# Patient Record
Sex: Male | Born: 1980
Health system: Southern US, Community
[De-identification: ages and names within clinical notes are randomized; demographics above are authoritative.]

## PROBLEM LIST (undated history)

## (undated) DIAGNOSIS — K219 Gastro-esophageal reflux disease without esophagitis: Secondary | ICD-10-CM

## (undated) DIAGNOSIS — I10 Essential (primary) hypertension: Secondary | ICD-10-CM

## (undated) DIAGNOSIS — J302 Other seasonal allergic rhinitis: Secondary | ICD-10-CM

## (undated) DIAGNOSIS — F1120 Opioid dependence, uncomplicated: Secondary | ICD-10-CM

## (undated) DIAGNOSIS — M1990 Unspecified osteoarthritis, unspecified site: Secondary | ICD-10-CM

## (undated) DIAGNOSIS — K5090 Crohn's disease, unspecified, without complications: Secondary | ICD-10-CM

## (undated) DIAGNOSIS — K5010 Crohn's disease of large intestine without complications: Secondary | ICD-10-CM

## (undated) DIAGNOSIS — Z87442 Personal history of urinary calculi: Secondary | ICD-10-CM

## (undated) DIAGNOSIS — K859 Acute pancreatitis without necrosis or infection, unspecified: Secondary | ICD-10-CM

## (undated) HISTORY — PX: TONSILLECTOMY: SUR1361

## (undated) HISTORY — PX: COLECTOMY: SHX59

## (undated) HISTORY — PX: LAPAROSCOPIC ABDOMINAL EXPLORATION: SHX6249

## (undated) HISTORY — PX: HERNIA REPAIR: SHX51

## (undated) HISTORY — PX: INCISIONAL HERNIA REPAIR: SHX193

## (undated) HISTORY — PX: COLONOSCOPY: SHX174

## (undated) HISTORY — DX: Opioid dependence, uncomplicated: F11.20

---

## 1998-01-05 ENCOUNTER — Inpatient Hospital Stay (HOSPITAL_COMMUNITY): Admission: EM | Admit: 1998-01-05 | Discharge: 1998-01-08 | Payer: Self-pay | Admitting: Gastroenterology

## 1998-01-05 ENCOUNTER — Ambulatory Visit (HOSPITAL_COMMUNITY): Admission: RE | Admit: 1998-01-05 | Discharge: 1998-01-05 | Payer: Self-pay | Admitting: Family Medicine

## 1998-06-23 HISTORY — PX: APPENDECTOMY: SHX54

## 1998-08-22 ENCOUNTER — Encounter: Payer: Self-pay | Admitting: Emergency Medicine

## 1998-08-22 ENCOUNTER — Emergency Department (HOSPITAL_COMMUNITY): Admission: EM | Admit: 1998-08-22 | Discharge: 1998-08-22 | Payer: Self-pay | Admitting: Emergency Medicine

## 1998-10-10 ENCOUNTER — Ambulatory Visit (HOSPITAL_COMMUNITY): Admission: RE | Admit: 1998-10-10 | Discharge: 1998-10-10 | Payer: Self-pay | Admitting: Gastroenterology

## 1998-11-17 ENCOUNTER — Encounter: Payer: Self-pay | Admitting: Emergency Medicine

## 1998-11-17 ENCOUNTER — Emergency Department (HOSPITAL_COMMUNITY): Admission: EM | Admit: 1998-11-17 | Discharge: 1998-11-17 | Payer: Self-pay | Admitting: Emergency Medicine

## 1998-12-15 ENCOUNTER — Encounter: Payer: Self-pay | Admitting: Gastroenterology

## 1998-12-15 ENCOUNTER — Inpatient Hospital Stay (HOSPITAL_COMMUNITY): Admission: EM | Admit: 1998-12-15 | Discharge: 1998-12-27 | Payer: Self-pay | Admitting: Emergency Medicine

## 1998-12-16 ENCOUNTER — Encounter: Payer: Self-pay | Admitting: Gastroenterology

## 1998-12-21 ENCOUNTER — Encounter (INDEPENDENT_AMBULATORY_CARE_PROVIDER_SITE_OTHER): Payer: Self-pay | Admitting: Specialist

## 1999-01-16 ENCOUNTER — Encounter: Payer: Self-pay | Admitting: General Surgery

## 1999-01-16 ENCOUNTER — Inpatient Hospital Stay (HOSPITAL_COMMUNITY): Admission: EM | Admit: 1999-01-16 | Discharge: 1999-01-18 | Payer: Self-pay | Admitting: *Deleted

## 1999-01-16 ENCOUNTER — Encounter: Payer: Self-pay | Admitting: Surgery

## 1999-01-20 ENCOUNTER — Emergency Department (HOSPITAL_COMMUNITY): Admission: EM | Admit: 1999-01-20 | Discharge: 1999-01-21 | Payer: Self-pay | Admitting: Internal Medicine

## 1999-01-21 ENCOUNTER — Encounter: Payer: Self-pay | Admitting: Internal Medicine

## 1999-01-24 ENCOUNTER — Encounter: Payer: Self-pay | Admitting: Gastroenterology

## 1999-01-24 ENCOUNTER — Ambulatory Visit (HOSPITAL_COMMUNITY): Admission: RE | Admit: 1999-01-24 | Discharge: 1999-01-24 | Payer: Self-pay | Admitting: Gastroenterology

## 1999-01-29 ENCOUNTER — Encounter: Payer: Self-pay | Admitting: General Surgery

## 1999-01-29 ENCOUNTER — Encounter: Payer: Self-pay | Admitting: Emergency Medicine

## 1999-01-29 ENCOUNTER — Inpatient Hospital Stay (HOSPITAL_COMMUNITY): Admission: EM | Admit: 1999-01-29 | Discharge: 1999-02-02 | Payer: Self-pay | Admitting: Emergency Medicine

## 1999-07-14 ENCOUNTER — Encounter: Payer: Self-pay | Admitting: Emergency Medicine

## 1999-07-14 ENCOUNTER — Emergency Department (HOSPITAL_COMMUNITY): Admission: EM | Admit: 1999-07-14 | Discharge: 1999-07-14 | Payer: Self-pay | Admitting: Emergency Medicine

## 1999-10-14 ENCOUNTER — Encounter: Payer: Self-pay | Admitting: Gastroenterology

## 1999-10-14 ENCOUNTER — Ambulatory Visit (HOSPITAL_COMMUNITY): Admission: RE | Admit: 1999-10-14 | Discharge: 1999-10-14 | Payer: Self-pay | Admitting: Gastroenterology

## 1999-10-27 ENCOUNTER — Inpatient Hospital Stay (HOSPITAL_COMMUNITY): Admission: EM | Admit: 1999-10-27 | Discharge: 1999-10-31 | Payer: Self-pay

## 1999-10-27 ENCOUNTER — Encounter: Payer: Self-pay | Admitting: Gastroenterology

## 1999-10-29 ENCOUNTER — Encounter: Payer: Self-pay | Admitting: Gastroenterology

## 2000-01-27 ENCOUNTER — Inpatient Hospital Stay (HOSPITAL_COMMUNITY): Admission: RE | Admit: 2000-01-27 | Discharge: 2000-01-28 | Payer: Self-pay | Admitting: General Surgery

## 2000-07-01 ENCOUNTER — Encounter: Payer: Self-pay | Admitting: Gastroenterology

## 2000-07-02 ENCOUNTER — Inpatient Hospital Stay (HOSPITAL_COMMUNITY): Admission: EM | Admit: 2000-07-02 | Discharge: 2000-07-03 | Payer: Self-pay | Admitting: Emergency Medicine

## 2000-07-06 ENCOUNTER — Encounter: Payer: Self-pay | Admitting: Gastroenterology

## 2000-07-07 ENCOUNTER — Encounter: Payer: Self-pay | Admitting: Gastroenterology

## 2000-07-08 ENCOUNTER — Inpatient Hospital Stay (HOSPITAL_COMMUNITY): Admission: EM | Admit: 2000-07-08 | Discharge: 2000-07-09 | Payer: Self-pay | Admitting: Gastroenterology

## 2000-07-09 ENCOUNTER — Encounter: Payer: Self-pay | Admitting: Gastroenterology

## 2000-08-14 ENCOUNTER — Encounter: Payer: Self-pay | Admitting: Gastroenterology

## 2000-08-14 ENCOUNTER — Inpatient Hospital Stay (HOSPITAL_COMMUNITY): Admission: EM | Admit: 2000-08-14 | Discharge: 2000-08-21 | Payer: Self-pay | Admitting: Emergency Medicine

## 2000-08-18 ENCOUNTER — Encounter: Payer: Self-pay | Admitting: Gastroenterology

## 2000-08-20 ENCOUNTER — Encounter: Payer: Self-pay | Admitting: Gastroenterology

## 2000-08-21 ENCOUNTER — Encounter: Payer: Self-pay | Admitting: Gastroenterology

## 2000-09-09 ENCOUNTER — Emergency Department (HOSPITAL_COMMUNITY): Admission: EM | Admit: 2000-09-09 | Discharge: 2000-09-09 | Payer: Self-pay | Admitting: Emergency Medicine

## 2000-09-09 ENCOUNTER — Encounter: Payer: Self-pay | Admitting: Emergency Medicine

## 2000-09-15 ENCOUNTER — Emergency Department (HOSPITAL_COMMUNITY): Admission: EM | Admit: 2000-09-15 | Discharge: 2000-09-16 | Payer: Self-pay | Admitting: Emergency Medicine

## 2000-09-16 ENCOUNTER — Encounter: Payer: Self-pay | Admitting: Emergency Medicine

## 2000-09-17 ENCOUNTER — Observation Stay (HOSPITAL_COMMUNITY): Admission: EM | Admit: 2000-09-17 | Discharge: 2000-09-18 | Payer: Self-pay | Admitting: Emergency Medicine

## 2000-09-17 ENCOUNTER — Encounter: Payer: Self-pay | Admitting: Family Medicine

## 2000-09-23 ENCOUNTER — Emergency Department (HOSPITAL_COMMUNITY): Admission: EM | Admit: 2000-09-23 | Discharge: 2000-09-23 | Payer: Self-pay | Admitting: Emergency Medicine

## 2000-09-23 ENCOUNTER — Encounter: Payer: Self-pay | Admitting: Emergency Medicine

## 2000-10-16 ENCOUNTER — Emergency Department (HOSPITAL_COMMUNITY): Admission: EM | Admit: 2000-10-16 | Discharge: 2000-10-16 | Payer: Self-pay | Admitting: Emergency Medicine

## 2000-10-16 ENCOUNTER — Encounter: Payer: Self-pay | Admitting: Emergency Medicine

## 2000-10-18 ENCOUNTER — Emergency Department (HOSPITAL_COMMUNITY): Admission: EM | Admit: 2000-10-18 | Discharge: 2000-10-18 | Payer: Self-pay | Admitting: Emergency Medicine

## 2000-10-20 ENCOUNTER — Emergency Department (HOSPITAL_COMMUNITY): Admission: EM | Admit: 2000-10-20 | Discharge: 2000-10-20 | Payer: Self-pay | Admitting: Emergency Medicine

## 2000-10-23 ENCOUNTER — Encounter: Payer: Self-pay | Admitting: Emergency Medicine

## 2000-10-23 ENCOUNTER — Emergency Department (HOSPITAL_COMMUNITY): Admission: EM | Admit: 2000-10-23 | Discharge: 2000-10-23 | Payer: Self-pay | Admitting: Emergency Medicine

## 2000-11-17 ENCOUNTER — Emergency Department (HOSPITAL_COMMUNITY): Admission: EM | Admit: 2000-11-17 | Discharge: 2000-11-18 | Payer: Self-pay | Admitting: Emergency Medicine

## 2000-12-14 ENCOUNTER — Emergency Department (HOSPITAL_COMMUNITY): Admission: EM | Admit: 2000-12-14 | Discharge: 2000-12-14 | Payer: Self-pay | Admitting: *Deleted

## 2000-12-14 ENCOUNTER — Encounter: Payer: Self-pay | Admitting: Emergency Medicine

## 2000-12-28 ENCOUNTER — Encounter: Payer: Self-pay | Admitting: Internal Medicine

## 2000-12-28 ENCOUNTER — Inpatient Hospital Stay (HOSPITAL_COMMUNITY): Admission: RE | Admit: 2000-12-28 | Discharge: 2000-12-31 | Payer: Self-pay | Admitting: Gastroenterology

## 2000-12-29 ENCOUNTER — Encounter: Payer: Self-pay | Admitting: Internal Medicine

## 2000-12-30 ENCOUNTER — Encounter: Payer: Self-pay | Admitting: Gastroenterology

## 2001-01-08 ENCOUNTER — Encounter: Payer: Self-pay | Admitting: Emergency Medicine

## 2001-01-08 ENCOUNTER — Emergency Department (HOSPITAL_COMMUNITY): Admission: EM | Admit: 2001-01-08 | Discharge: 2001-01-08 | Payer: Self-pay | Admitting: Emergency Medicine

## 2001-01-10 ENCOUNTER — Encounter: Payer: Self-pay | Admitting: Emergency Medicine

## 2001-01-10 ENCOUNTER — Emergency Department (HOSPITAL_COMMUNITY): Admission: EM | Admit: 2001-01-10 | Discharge: 2001-01-10 | Payer: Self-pay | Admitting: *Deleted

## 2001-01-13 ENCOUNTER — Emergency Department (HOSPITAL_COMMUNITY): Admission: EM | Admit: 2001-01-13 | Discharge: 2001-01-14 | Payer: Self-pay | Admitting: Internal Medicine

## 2001-01-18 ENCOUNTER — Emergency Department (HOSPITAL_COMMUNITY): Admission: EM | Admit: 2001-01-18 | Discharge: 2001-01-18 | Payer: Self-pay | Admitting: Emergency Medicine

## 2001-01-26 ENCOUNTER — Encounter: Admission: RE | Admit: 2001-01-26 | Discharge: 2001-02-20 | Payer: Self-pay | Admitting: Anesthesiology

## 2001-06-29 ENCOUNTER — Encounter: Payer: Self-pay | Admitting: Emergency Medicine

## 2001-06-29 ENCOUNTER — Emergency Department (HOSPITAL_COMMUNITY): Admission: EM | Admit: 2001-06-29 | Discharge: 2001-06-29 | Payer: Self-pay | Admitting: Emergency Medicine

## 2001-08-05 ENCOUNTER — Encounter: Payer: Self-pay | Admitting: Emergency Medicine

## 2001-08-05 ENCOUNTER — Emergency Department (HOSPITAL_COMMUNITY): Admission: EM | Admit: 2001-08-05 | Discharge: 2001-08-05 | Payer: Self-pay | Admitting: Emergency Medicine

## 2001-08-07 ENCOUNTER — Emergency Department (HOSPITAL_COMMUNITY): Admission: EM | Admit: 2001-08-07 | Discharge: 2001-08-07 | Payer: Self-pay | Admitting: Emergency Medicine

## 2001-08-07 ENCOUNTER — Encounter: Payer: Self-pay | Admitting: Emergency Medicine

## 2001-09-03 ENCOUNTER — Encounter: Payer: Self-pay | Admitting: Emergency Medicine

## 2001-09-03 ENCOUNTER — Emergency Department (HOSPITAL_COMMUNITY): Admission: EM | Admit: 2001-09-03 | Discharge: 2001-09-03 | Payer: Self-pay | Admitting: Emergency Medicine

## 2001-09-19 ENCOUNTER — Encounter: Payer: Self-pay | Admitting: Emergency Medicine

## 2001-09-19 ENCOUNTER — Emergency Department (HOSPITAL_COMMUNITY): Admission: EM | Admit: 2001-09-19 | Discharge: 2001-09-19 | Payer: Self-pay | Admitting: Emergency Medicine

## 2001-09-25 ENCOUNTER — Emergency Department (HOSPITAL_COMMUNITY): Admission: EM | Admit: 2001-09-25 | Discharge: 2001-09-25 | Payer: Self-pay | Admitting: Emergency Medicine

## 2001-09-25 ENCOUNTER — Encounter: Payer: Self-pay | Admitting: Emergency Medicine

## 2001-10-09 ENCOUNTER — Emergency Department (HOSPITAL_COMMUNITY): Admission: EM | Admit: 2001-10-09 | Discharge: 2001-10-09 | Payer: Self-pay

## 2001-10-15 ENCOUNTER — Emergency Department (HOSPITAL_COMMUNITY): Admission: EM | Admit: 2001-10-15 | Discharge: 2001-10-15 | Payer: Self-pay | Admitting: Emergency Medicine

## 2001-11-05 ENCOUNTER — Ambulatory Visit (HOSPITAL_COMMUNITY): Admission: RE | Admit: 2001-11-05 | Discharge: 2001-11-05 | Payer: Self-pay | Admitting: Gastroenterology

## 2001-11-08 ENCOUNTER — Emergency Department (HOSPITAL_COMMUNITY): Admission: EM | Admit: 2001-11-08 | Discharge: 2001-11-08 | Payer: Self-pay | Admitting: Emergency Medicine

## 2001-11-08 ENCOUNTER — Encounter: Payer: Self-pay | Admitting: Emergency Medicine

## 2001-11-09 ENCOUNTER — Emergency Department (HOSPITAL_COMMUNITY): Admission: EM | Admit: 2001-11-09 | Discharge: 2001-11-10 | Payer: Self-pay | Admitting: *Deleted

## 2001-11-10 ENCOUNTER — Encounter: Payer: Self-pay | Admitting: Emergency Medicine

## 2001-11-18 ENCOUNTER — Emergency Department (HOSPITAL_COMMUNITY): Admission: EM | Admit: 2001-11-18 | Discharge: 2001-11-19 | Payer: Self-pay | Admitting: Emergency Medicine

## 2001-12-08 ENCOUNTER — Emergency Department (HOSPITAL_COMMUNITY): Admission: EM | Admit: 2001-12-08 | Discharge: 2001-12-08 | Payer: Self-pay

## 2001-12-09 ENCOUNTER — Emergency Department (HOSPITAL_COMMUNITY): Admission: EM | Admit: 2001-12-09 | Discharge: 2001-12-09 | Payer: Self-pay | Admitting: Emergency Medicine

## 2002-01-12 ENCOUNTER — Emergency Department (HOSPITAL_COMMUNITY): Admission: EM | Admit: 2002-01-12 | Discharge: 2002-01-12 | Payer: Self-pay | Admitting: Emergency Medicine

## 2002-01-14 ENCOUNTER — Emergency Department (HOSPITAL_COMMUNITY): Admission: EM | Admit: 2002-01-14 | Discharge: 2002-01-14 | Payer: Self-pay | Admitting: Emergency Medicine

## 2002-01-15 ENCOUNTER — Emergency Department (HOSPITAL_COMMUNITY): Admission: EM | Admit: 2002-01-15 | Discharge: 2002-01-15 | Payer: Self-pay | Admitting: Emergency Medicine

## 2002-01-15 ENCOUNTER — Encounter: Payer: Self-pay | Admitting: Emergency Medicine

## 2002-01-17 ENCOUNTER — Emergency Department (HOSPITAL_COMMUNITY): Admission: EM | Admit: 2002-01-17 | Discharge: 2002-01-17 | Payer: Self-pay | Admitting: Emergency Medicine

## 2002-03-17 ENCOUNTER — Emergency Department (HOSPITAL_COMMUNITY): Admission: EM | Admit: 2002-03-17 | Discharge: 2002-03-17 | Payer: Self-pay | Admitting: Emergency Medicine

## 2002-08-29 ENCOUNTER — Emergency Department (HOSPITAL_COMMUNITY): Admission: EM | Admit: 2002-08-29 | Discharge: 2002-08-29 | Payer: Self-pay | Admitting: Emergency Medicine

## 2002-08-29 ENCOUNTER — Encounter: Payer: Self-pay | Admitting: Emergency Medicine

## 2003-05-31 ENCOUNTER — Emergency Department (HOSPITAL_COMMUNITY): Admission: AD | Admit: 2003-05-31 | Discharge: 2003-05-31 | Payer: Self-pay | Admitting: Family Medicine

## 2003-06-18 ENCOUNTER — Emergency Department (HOSPITAL_COMMUNITY): Admission: EM | Admit: 2003-06-18 | Discharge: 2003-06-18 | Payer: Self-pay | Admitting: Emergency Medicine

## 2003-06-24 HISTORY — PX: LAPAROSCOPIC CHOLECYSTECTOMY: SUR755

## 2004-03-10 ENCOUNTER — Emergency Department (HOSPITAL_COMMUNITY): Admission: EM | Admit: 2004-03-10 | Discharge: 2004-03-10 | Payer: Self-pay | Admitting: Emergency Medicine

## 2005-12-03 ENCOUNTER — Emergency Department (HOSPITAL_COMMUNITY): Admission: EM | Admit: 2005-12-03 | Discharge: 2005-12-03 | Payer: Self-pay | Admitting: Emergency Medicine

## 2006-02-05 ENCOUNTER — Emergency Department (HOSPITAL_COMMUNITY): Admission: EM | Admit: 2006-02-05 | Discharge: 2006-02-05 | Payer: Self-pay | Admitting: Emergency Medicine

## 2006-03-26 ENCOUNTER — Emergency Department (HOSPITAL_COMMUNITY): Admission: EM | Admit: 2006-03-26 | Discharge: 2006-03-26 | Payer: Self-pay | Admitting: Emergency Medicine

## 2006-04-14 ENCOUNTER — Emergency Department (HOSPITAL_COMMUNITY): Admission: EM | Admit: 2006-04-14 | Discharge: 2006-04-15 | Payer: Self-pay | Admitting: Emergency Medicine

## 2006-04-21 ENCOUNTER — Ambulatory Visit (HOSPITAL_COMMUNITY): Admission: RE | Admit: 2006-04-21 | Discharge: 2006-04-21 | Payer: Self-pay | Admitting: Family Medicine

## 2006-07-13 ENCOUNTER — Emergency Department (HOSPITAL_COMMUNITY): Admission: EM | Admit: 2006-07-13 | Discharge: 2006-07-13 | Payer: Self-pay | Admitting: Emergency Medicine

## 2006-07-15 ENCOUNTER — Emergency Department (HOSPITAL_COMMUNITY): Admission: EM | Admit: 2006-07-15 | Discharge: 2006-07-15 | Payer: Self-pay | Admitting: Emergency Medicine

## 2006-07-26 ENCOUNTER — Emergency Department: Payer: Self-pay | Admitting: Emergency Medicine

## 2006-08-04 ENCOUNTER — Emergency Department: Payer: Self-pay | Admitting: Unknown Physician Specialty

## 2006-09-01 ENCOUNTER — Encounter (INDEPENDENT_AMBULATORY_CARE_PROVIDER_SITE_OTHER): Payer: Self-pay | Admitting: Specialist

## 2006-09-01 ENCOUNTER — Ambulatory Visit (HOSPITAL_COMMUNITY): Admission: RE | Admit: 2006-09-01 | Discharge: 2006-09-01 | Payer: Self-pay | Admitting: Surgery

## 2006-09-12 ENCOUNTER — Emergency Department (HOSPITAL_COMMUNITY): Admission: EM | Admit: 2006-09-12 | Discharge: 2006-09-12 | Payer: Self-pay | Admitting: Emergency Medicine

## 2007-10-01 ENCOUNTER — Emergency Department (HOSPITAL_COMMUNITY): Admission: EM | Admit: 2007-10-01 | Discharge: 2007-10-01 | Payer: Self-pay | Admitting: Emergency Medicine

## 2007-10-12 ENCOUNTER — Emergency Department (HOSPITAL_COMMUNITY): Admission: EM | Admit: 2007-10-12 | Discharge: 2007-10-12 | Payer: Self-pay | Admitting: Emergency Medicine

## 2007-12-18 ENCOUNTER — Emergency Department (HOSPITAL_BASED_OUTPATIENT_CLINIC_OR_DEPARTMENT_OTHER): Admission: EM | Admit: 2007-12-18 | Discharge: 2007-12-18 | Payer: Self-pay | Admitting: Emergency Medicine

## 2007-12-31 ENCOUNTER — Emergency Department (HOSPITAL_BASED_OUTPATIENT_CLINIC_OR_DEPARTMENT_OTHER): Admission: EM | Admit: 2007-12-31 | Discharge: 2007-12-31 | Payer: Self-pay | Admitting: Emergency Medicine

## 2008-01-25 ENCOUNTER — Emergency Department (HOSPITAL_COMMUNITY): Admission: EM | Admit: 2008-01-25 | Discharge: 2008-01-25 | Payer: Self-pay | Admitting: Emergency Medicine

## 2008-01-26 ENCOUNTER — Emergency Department (HOSPITAL_COMMUNITY): Admission: EM | Admit: 2008-01-26 | Discharge: 2008-01-26 | Payer: Self-pay | Admitting: Emergency Medicine

## 2009-06-23 DIAGNOSIS — F1120 Opioid dependence, uncomplicated: Secondary | ICD-10-CM

## 2009-06-23 HISTORY — DX: Opioid dependence, uncomplicated: F11.20

## 2010-08-31 ENCOUNTER — Emergency Department (HOSPITAL_BASED_OUTPATIENT_CLINIC_OR_DEPARTMENT_OTHER)
Admission: EM | Admit: 2010-08-31 | Discharge: 2010-08-31 | Disposition: A | Discharge status: Home / Self Care | Payer: Self-pay | Attending: Emergency Medicine | Admitting: Emergency Medicine

## 2010-08-31 DIAGNOSIS — R220 Localized swelling, mass and lump, head: Secondary | ICD-10-CM | POA: Insufficient documentation

## 2010-08-31 DIAGNOSIS — K029 Dental caries, unspecified: Secondary | ICD-10-CM | POA: Insufficient documentation

## 2010-08-31 DIAGNOSIS — K089 Disorder of teeth and supporting structures, unspecified: Secondary | ICD-10-CM | POA: Insufficient documentation

## 2010-08-31 DIAGNOSIS — K5090 Crohn's disease, unspecified, without complications: Secondary | ICD-10-CM | POA: Insufficient documentation

## 2010-08-31 DIAGNOSIS — K219 Gastro-esophageal reflux disease without esophagitis: Secondary | ICD-10-CM | POA: Insufficient documentation

## 2010-08-31 DIAGNOSIS — I10 Essential (primary) hypertension: Secondary | ICD-10-CM | POA: Insufficient documentation

## 2010-11-08 NOTE — H&P (Signed)
[facility]  Patient:    Leroy Herrera, Leroy Herrera                     MRN: [id number] Adm. Date:  [id number] Attending:  Nicholaus Bloom CC:         Sandy Salaam. Deatra Ina, M.D. [facility]  Stephens Shire, M.D.  Dr. Oletta Lamas   History and Physical  NEW PATIENT EVALUATION:  Leroy Herrera is a 30 year old who is sent to [place] by Sandy Salaam. Deatra Ina, M.D. for chronic abdominal pain. The patient complains of right upper quadrant, constant, deep, dull, toothache-type discomfort in the right upper quadrant with no lancinating quality. He related the following history. At the age of 31, he was hospitalized with pancreatitis. At which time, he had a sharp, central, epigastric discomfort. He was treated with bowel rest and IV fluids and did well. He went onto college at that point and developed intermittent discomfort in his abdomen and was evaluated by Jeneen Rinks L. Edwards, M.D. and found on colonoscopy to have Crohns disease. He was treated with prednisone and Pentasa. He took this for about a year and was doing well up until June of 2000 when he developed a perforation of his colon and underwent a partial colectomy by Benjamin T. Hoxworth, M.D. He did well for a few weeks and redeveloped his discomfort. He later underwent a exploratory laparotomy in August of 2001 with lysis of adhesions. Following this, he was left with his chronic right upper quadrant, dull discomfort. He could not identify any exacerbating or improving features, except for the fact that if he works all day long towards the end of the day he has discomfort. He has noted that pain medications will take the edge off. Vicodin is much more effective than Darvocet. He has been tried on Tylenol No. 3 but describes splotches of his face and swelling of his neck and morphine which causes splotching and what sounds like hives. He denies numbness or tingling, bowel or bladder incontinence, or weakness. He does not know if his  abdominal complaints are affected by his bowel functions or his eating.  He has had a bout of nephrolithiasis and can tell the difference between these discomforts.  Other medical interventions have included Demerol and Tylenol No. 3.  CURRENT MEDICATIONS:  Pentasa, Paxil, and Darvocet.  ALLERGIES:  TYLENOL NO. 3 and MORPHINE.  FAMILY HISTORY:  Positive for diabetes, cancer, hypertension, and end-stage renal disease.  PAST SURGICAL HISTORY:  Significant for ileocolonic resection in 2000, exploratory laparotomy with lysis of adhesions in August of 2001.  SOCIAL HISTORY:  The patient is a pack-per-day smoker. He does not drink alcohol. 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  ACTIVE MEDICAL PROBLEMS:  Crohns disease, history of renal calculi, and a remote history of pancreatitis, with remote history of gastroesophageal reflux disease.  REVIEW OF SYSTEMS:  GENERAL:  Negative.  HEAD:  Negative. EYES:  Negative. NOSE, MOUTH, THROAT:  Negative. EARS: Negative. PULMONARY:  Negative. CARDIOVASCULAR:  Negative. GI/GU:  See HPI. MUSCULOSKELETAL:  Negative. NEUROLOGICAL:  Negative. HEMATOLOGIC:  Negative. CUTANEOUS:  Negative. ENDOCRINE:  Negative. PSYCHIATRIC:  Positive for depression and panic attacks. ALLERGY/IMMUNOLOGIC:  Negative.  PHYSICAL EXAMINATION:  VITAL SIGNS:  Blood pressure is 144/84, heart rate is 88, respiratory rate is 18, O2 saturation is 98%, pain level is 7/10.  HEENT:  Head was normocephalic, atraumatic. Eyes:  Extraocular movements intact with conjunctivae and sclerae clear. Nose:  Patent nares without discharge. Oropharynx:  Free of  lesions.  NECK:  Supple without lymphadenopathy. Carotids were 2+ and symmetric without bruits.  LUNGS:  Clear.  HEART:  Regular rate and rhythm.  ABDOMEN:  Well-healed abdominal surgical scars with bowel sounds present. He had intact superficial abdominal reflexes, intact sensation over his abdomen. I cannot  appreciate any organomegaly.  GENITALIA:  Not performed.  RECTAL:  Not performed.  BACK:  No tenderness to percussion over the vertebrae with negative straight leg raise signs.  EXTREMITIES:  No clubbing, cyanosis, or edema. Radial pulses and dorsalis pedis pulses were 2+ and symmetric.  NEUROLOGICAL:  The patient was oriented to person, place, time, and reason for visit. Cranial nerves 2 through 12 are grossly intact. Deep tendon reflexes were symmetric in the upper and lower extremities with downgoing toes. Motor was 5/5 with symmetric bulk and tone. Sensory was intact to vibratory sense, pinprick, and light touch. Coordination was grossly intact.  IMPRESSION: 1. Chronic abdominal pain with history of pancreatitis in the past and Crohns    disease. 2. History of renal calculi. 3. Depression/panic disorder. 4. History of gastroesophageal reflux disease.  DISPOSITION: 1. I advised the patient that I felt that Darvocet was probably not a good    drug for a chronic abdominal pain and recommended that we go to a    longer-acting opiate rather than short-acting opiate which would probably    give him more smooth and even control of his discomfort. We discussed the    options of methadone versus Duragesic patches versus OxyContin. He wishes    to try the OxyContin initially. He has tried Percocet in the past and did    not breakout with a rash. I will go ahead and start him on OxyContin 10 mg    one p.o. b.i.d., #60 with no refill. He is aware that he needs to stop the    Darvocet while he is on this. Side effects of this were reviewed with him    in detail. 2. He has gone ahead and signed the controlled substance agreement with [place]. 3. I will see him back in follow-up in four weeks, but he is to call me in    seven days to let me know how he is doing. In the meantime, I have     encouraged him to go ahead and get ______ forms for his primary care    physician to fill out so that  he does not get into problems with his    employer with regard to his Crohns disease. He plans to follow up with me    at the [address] address. DD:  01/27/01 TD:  01/27/01 Job: [id number]

## 2010-11-08 NOTE — Op Note (Signed)
[facility]  Patient:    Leroy Herrera, Leroy Herrera                     MRN: [id number] Proc. Date: 01/27/00 Adm. Date:  [id number] Attending:  Excell Seltzer [place]                           Operative Report  PREOPERATIVE DIAGNOSIS:  History of Crohns disease with recurring abdominal pain.  POSTOPERATIVE DIAGNOSIS:  History of Crohns disease with recurring abdominal pain.  OPERATION PERFORMED:  Laparoscopy and lysis of adhesions.  SURGEON:  Darene Lamer. Hoxworth, M.D.  ANESTHESIA:  General.  BRIEF HISTORY:  Leroy Herrera is a 30 year old white male one year status post resection of terminal ileum for severe Crohns disease.  Since that time he has had recurring episodic abdominal pain suggestive of intermittent obstruction, but has had a very thorough negative workup on several occasions. His pain continues to recur and is a significant problem for him.  We have discussed options including observation versus laparoscopy and lysis of adhesions.  After a long discussion with the patient and his family, we have elected to proceed with laparoscopy.  The nature of the procedure, indications and risks of bleeding, infection, bowel injury and possible need for open procedure were discussed and understood preoperatively.  He is now brought to the operating room for this procedure.  DESCRIPTION OF PROCEDURE:  The patient was brought to the operating room and placed in the supine position on the operating table and general endotracheal anesthesia was induced.  He had had a mechanical and antibiotic bowel prep. Antibiotics were given preoperatively.  A Foley catheter was placed.  The abdomen was sterilely prepped and draped.  An incision 1 cm was made in the left flank and dissection carried down through the fascia and muscle layer, and the peritoneum entered.  A Hasson trocar was placed through a mattress suture of 0 Vicryl.  A pneumoperitoneum was established.   There were adhesions of the omentum up to the anterior midline incision.  Two 5 mm trocars were placed in the left and right mid abdomen under direct vision.  The omental adhesions were then all taken down from the anterior abdominal wall with sharp and blunt dissection, and use of the harmonic scalpel.  The anterior abdominal wall was completely clear.  There was no evidence of incisional hernia.  The omentum was then retracted superiorly and the small bowel carefully examined. Beginning at about the mid ileum, the small bowel was traced distally.  About 6 inches from the anastomosis there was a single rather dense band like adhesion from the terminal ileum down to the mesentery with a potential closed loop several cm in diameter.  This band was divided with the harmonic scalpel. The terminal ileum was then traced distally and the anastomosis identified. The terminal ileum was completely freed up to the area of the anastomosis. The bowel itself appeared normal with no evidence of Crohns disease or other active inflammation.  Following this, the bowel was then traced distally to proximally from the anastomosis to the ileocecal valve and there were no further adhesions or abnormalities of any kind.  The liver and gallbladder appeared normal.  The abdomen was again inspected for hemostasis and any evidence of bowel injury and everything looked fine.  The viscera were returned to their anatomical position.  The trocars were removed under direct vision and the pursestring suture secured  in the left flank.  Skin incisions were closed with interrupted subcuticular 4-0 Monocryl and Steri-Strips. Sponge and needle counts correct.  Dry sterile dressing was applied and the patient taken to recovery room in good condition. DD:  01/27/00 TD:  01/28/00 Job: [id number]

## 2010-11-08 NOTE — Discharge Summary (Signed)
[facility]  Patient:    Leroy Herrera, Leroy Herrera                     MRN: [id number] Adm. Date:  [id number] Disc. Date: [id number] Attending:  Eustaquio Maize CC:         Princess Bruins. Gaynell Face, M.D.  Thomas C. Verl Blalock, M.D. [facility]  James L. Oletta Lamas, M.D.   Discharge Summary  ADMISSION DIAGNOSES: 1. Syncope. 2. Crohns. 3. Nephrolithiasis. 4. Depression.  DISCHARGE DIAGNOSES: 1. Syncope secondary to dehydration/micturition. 2. Gastroesophageal reflux disease. 3. Depression. 4. Crohns. 5. Nephrolithiasis.  HISTORY OF PRESENT ILLNESS:  This is a 30 year old white male with long-standing history of Crohns, and recently found to have nephrolithiasis, had felt a little bit dizzy when he had passed kidney stones.  There was some pain.  Also has low back pain, a little worse since he fell.  He has felt dizzy and light-headed every time he stands, also sometimes when he is sitting. He tells me never when he is laying down.  Yesterday and today he passed out with no warning.  This was shortly after urination.  It was felt he was unconscious for approximately 3 minutes.  Mom says the second time he gasped for air, and was complaining of chest discomfort.  This was described as a tightness, but was not associated with nausea, vomiting, diaphoresis, shortness of breath, radiation, etc.  There was no seizure activity noted.  He has had no recent fevers.  He is admitted at this time for further evaluation and cardiology/neurology consults.  HOSPITAL COURSE:  The patient was admitted, was placed on telemetry. Laboratory data was obtained, and an EEG was done which was negative.  Head CT was also negative.  Chest x-ray was normal.  All labs checked were unremarkable, except for urine showed large blood.  Drug screen was positive for benzodiazepines which he has taken in the past.  Troponin was 0.03.  CK-MB was 0.9.  White count 7100, hemoglobin 14, hematocrit 41.1.   Normal differential.  TSH 0.936.  CK-MB at 0900 on 09/17/00 was 103 with an MB of 1.2. Electrolytes and LFTs were all normal.  His telemetry showed no significant abnormality.  A 2-D echocardiogram was also obtained, and unfortunately the results are pending at this time.  He was up and ambulating in the room with no difficulty.  He did have significant orthostatic blood pressure changes. Subsequently it was felt IV fluids and chronic hydration was indicated.  Dr. Verl Blalock from a cardiology standpoint saw the patient and felt this was most likely post-micturitional syncope.  Dr. Gaynell Face also saw the patient from a neurology standpoint, and agreed that the syncope was likely post-micturitional.  He also felt that this was unrelated to [place] basilar migraines, unlikely seizures with a normal EEG.  The patient also has migraines which occur about once a week, but he does not miss work from these. Typically, he will take Tylenol or Advil, lay down, and it will resolve.  He also has a strong family history of diabetes, and had a mild peripheral polyneuropathy in a stalking distribution (Dr. Gaynell Face felt a hemoglobin A1C may be indicated).  At the time of his visit it was felt that he might have a right otitis externa.  When I looked at him today his TM and EACs looked okay.  LABORATORY DATA:  Only as above.  DISPOSITION:  The patient is discharged to home on Paxil 20 mg p.o. q.d., Nexium 40  mg p.o. q.d., Pentasa four pills p.o. q.i.d., ______ 0.125 mg p.o. q.i.d. p.r.n.  These are all old medications.  His new medication will be Naprosyn 500 mg p.o. b.i.d. with food for back pain.  ACTIVITIES:  As tolerated.  He is instructed to drink at least eight 8 ounce glasses of water per day.  He is to call me if he has any questions, and he should see me in the office within the next month if he is having any further problems. DD:  09/18/00 TD:  09/18/00 Job: [id number]

## 2010-11-08 NOTE — Discharge Summary (Signed)
[facility]  Patient:    Leroy Herrera, Leroy Herrera                     MRN: [id number] Adm. Date:  [id number] Disc. Date: [id number] Attending:  Rafael Bihari CC:         Stephens Shire, M.D.  Darene Lamer. Hoxworth, M.D.  Shanda Bumps., M.D.   Discharge Summary  REASON FOR ADMISSION:  Nausea, vomiting, abdominal pain, chills, possible bowel obstruction due to Crohns disease.  DISCHARGE DIAGNOSES: 1. Renal stone. 2. Abdominal pain, nausea and vomiting resolved. 3. History of Crohns disease.  HISTORY OF PRESENT ILLNESS:  This is a 30 year old, white male with a history of Crohns disease at terminal ileum.  He has had previous resection with several bouts of severe abdominal pain, nausea and vomiting requiring hospitalization.  These have all resolved without any specific therapies. Previous CT scans, urinalysis and small bowel series have all failed to show any cause for his pain.  He came in with another episode with similar symptoms.  PHYSICAL EXAMINATION:  VITAL SIGNS:  Temperature 98.5, pulse 108, blood pressure 138/99.  GENERAL:  Well-developed, well-nourished, white male in no acute distress. Had received Demerol.  HEENT:  Normal.  LUNGS:  Normal.  CARDIOVASCULAR:  Normal.  ABDOMEN:  Soft, nondistended with hypoactive bowel sounds, mild right-sided tenderness.  For more details, please see dictated admission History and Physical.  HOSPITAL COURSE:  The patient was admitted to the medical floor.  His admission labs revealed a normal white count and normal liver function test. He had a small bowel series that showed no evidence of active Crohns disease. He also had abdominal films that showed no evidence of obstruction.  There were several air fluid levels that could have been early obstruction or a possible ileus.  The patient continued to complain of pain.  Some of this appeared to be heartburn.  He would eat, then throw  up, then stop eating due to pain.  He felt he needed narcotics.  At times, he appeared to be pain free. He was seen in consultation by Dr. Excell Seltzer who did not feel he had a surgical abdomen.  His pain did improve and his diet was advanced.  He was treated empirically with Librax and Protonix.  He had a return of pain and labs were repeated including a urinalysis which showed hematuria.  Attempts were made to perform a CT scan of the abdomen by renal protocol to rule out an obstructed ureter from renal stones.  Due to the barium the patient ingested from his small bowel series, this was unable to be done for two days until he had an aggressive laxative therapy.  He still continued to have pain.  The night before admission, the patient had two small stones that were passed and recovered.  It was anticipated that he would see the urologist and we had the patient keep the stones to show to the urologist when he went.  He was pain free.  A CT had been obtained that showed no obstructed ureter and no evidence of recurrent stones.  We did make a follow-up appointment with Dr. Serita Butcher and he is due to follow up with Dr. Oletta Lamas in three to four weeks.  DISPOSITION:  Discharged to home.  CONDITION ON DISCHARGE:  Much improved.  He is still having some abdominal pain, but much better.  DIET:  Regular.  DISCHARGE MEDICATIONS: 1. Vicodin ES one q.4h. p.r.n.  2. Pentaza four tablets q.i.d. 3. Ativan 1 mg b.i.d. 4. Paxil 20 mg q.d. 5. Bentyl 20 mg t.i.d. 6. Prilosec p.r.n. DD:  09/08/00 TD:  09/09/00 Job: [id number]

## 2010-11-08 NOTE — Discharge Summary (Signed)
[facility]  Patient:    Leroy Herrera, Leroy Herrera                     MRN: [id number] Adm. Date:  [id number] Disc. Date: [id number] Attending:  Sherrin Daisy CC:         Stephens Shire, M.D.             Darene Lamer. Hoxworth, M.D.                           Discharge Summary  REASON FOR ADMISSION:  Lavada Mesi with Crohns disease with abdominal pain.  FINAL DIAGNOSES: 1. Nausea and vomiting and abdominal pain, resolved.  Etiology unclear. 2. History of Crohns disease, status post resection. 3. Nausea and vomiting. 4. Drug induced delirium.  PROCEDURES: 1. IVP. 2. CT of the abdomen and pelvis. 3. Abdominal ultrasound. 4. Upper GI. 5. Small-bowel follow-through.  HISTORY OF PRESENT ILLNESS:  This is a 30 year old with Crohns disease, failed medical therapy, ultimately ended up in 2000 with a resection of the terminal ileum and cecum.  Did well for a year, then developed recurrent abdominal pain, nausea and vomiting, has had several hospitalizations. Ultimately, ended up in August 2001 with a laparoscopy and lysis of adhesions by Dr. Excell Seltzer.  Did reasonably well since then.  Ended up being hospitalized last week.  He was discharged home.  Despite taking his medications he became dizzy, woke up with severe abdominal pain, nauseated, and vomited everything. He presented to the emergency room after these symptoms.  PHYSICAL EXAMINATION:  VITAL SIGNS:  Stable.  He was afebrile.  HEENT:  Normal.  HEART:  Normal.  LUNGS:  Normal.  ABDOMEN:  Soft, nondistended, with no obvious tenderness.  For more details, please see dictated admission history and physical.  HOSPITAL COURSE:  The patient was admitted to a medical floor.  His labs were remarkable for a normal white count, sedimentation rate, and urinalysis, other then a small amount of leukocyte esterase.  Culture revealed an insignificant growth, and normal CMET.  We did do an upper GI and small  bowel series which showed no evidence of obstruction.  No evidence of active Crohns disease. Abdominal ultrasound was normal.  CT of the abdomen and pelvis were normal. This was done with the renal stone protocol due to the abnormalities of his urine, and this was normal.  He was seen in consultation by Dr. Excell Seltzer who felt that he had the same pain as last year, and did not have a need for acute surgery.  After several days of IV pain medications, the patient improved. His diet was advanced.  He became confused, and it turned out that he had been apparently taking some Xanax that he had at home.  We assured him that he should not be taking, in addition to the Phenergan.  It appears that he has taken Phenergan before and become confused.  IVP was negative.  We went ahead and felt that since he was tolerating a diet he could be discharged home.  DISPOSITION:  The patient is discharged home in improved condition.  DISCHARGE MEDICATIONS: 1. Paxil 20 mg q.d. 2. Levbid 1/2 to one tablet b.i.d. or t.i.d. 3. Xanax 0.25 mg one tablet t.i.d. p.r.n. 4. Vicodin p.r.n. pain. 5. He is instructed to avoid Phenergan.  FOLLOWUP:  He will see Dr. Oletta Lamas in the office in 2 to 3 weeks, and call if symptoms worsen. DD:  07/26/00 TD:  07/28/00 Job: [id number]

## 2010-11-08 NOTE — Op Note (Signed)
NAME:  Leroy Herrera, Leroy Herrera NO.:  [id number]   MEDICAL RECORD NO.:  [id number]          PATIENT TYPE:  AMB   LOCATION:  DAY                          FACILITY:  [facility]   PHYSICIAN:  Thomas A. Cornett, M.D.DATE OF BIRTH:  May 06, 1981   DATE OF PROCEDURE:  09/01/2006  DATE OF DISCHARGE:                               OPERATIVE REPORT   PREOPERATIVE DIAGNOSES:  Biliary colic with a low ejection fraction.   POSTOPERATIVE DIAGNOSES:  Biliary colic with a low ejection fraction.   PROCEDURE:  Laparoscopic cholecystectomy with cholangiogram.   SURGEON:  Thomas A. Cornett, M.D.   ANESTHESIA:  General endotracheal anesthesia with 0.25% Sensorcaine.   ESTIMATED BLOOD LOSS:  20 mL.   ASSISTANT:  Dr. Kathrin Penner.   DRAINS:  None.   SPECIMENS:  Gallbladder to pathology.   INDICATIONS FOR PROCEDURE:  The patient is a 30 year old male with  significant right upper quadrant pain. He had an extensive workup and  the only abnormality was a depressed ejection fraction on a nuclear  medicine study. After talking with him, I felt his symptoms were  consistent with colic and recommended a laparoscopic cholecystectomy for  relief of his pain. I explained this only works in about half the  patients in this setting but he was eager to proceed since he has tried  everything else short of this. Informed consent was obtained. The  potential complications were discussed with the patient and he agreed to  proceed.   DESCRIPTION OF PROCEDURE:  The patient was brought to the operating  suite and placed supine. After induction of general anesthesia, the  abdomen was prepped and draped in a sterile fashion. A 1 cm  supraumbilical incision was made, dissection was carried down to his  fascia. The fascia was opened with a scalpel blade. I used a small  hemostat to open the peritoneal lining and then placed my finger and  swept around and felt no intraabdominal adhesions. A pursestring suture  of #0 Vicryl was placed and a 12 mm Hasson cannula was placed under  direct vision. Pneumoperitoneum was created to 15 mmHg of CO2 and the  laparoscope was placed. There were some adhesions scattered throughout  the lower abdomen but the upper abdomen was clear. No signs of bowel  injury with insertion of the Hasson. An 11 mm subxiphoid port was then  placed with local anesthesia under direct vision. Two 5-mm ports were  placed in the right mid abdomen in a similar fashion. The dome of the  gallbladder was grasped and pushed toward the patient's right shoulder.  A second grasper was used to grab the infundibulum of the gallbladder  and pull it toward the patient's right lower quadrant. The peritoneum  was pulled away from the gallbladder to expose the cystic duct. I  dissected around it circumferentially. A clip was placed on the  gallbladder side of this and a small incision was made for  intraoperative cholangiogram. Through a separate stab incision, a Cook  cholangiogram catheter was introduced, placed in the cystic duct and  held in place via clip. One-half strength Hypaque  dye was used with  fluoroscopy. Unfortunately we only got a snap shot of the cholangiogram.  This showed the catheter entering the cystic duct with free flow of  contrast down the common duct into the duodenum. There was free flow up  the common hepatic duct to the bifurcation of right and left hepatic  ducts. There was no evidence of dilatation, stone or stricture. There  was free flow into the duodenum without extravasation. At this point in  time, I removed the catheter and triple clipped the cystic duct and  divided it. The cystic artery was divided in a similar fashion between  clips. There was a posterior branch that I controlled with the clip of  the cystic artery as well. Cautery was used to dissect the gallbladder  from the gallbladder bed without difficulty. The entire gallbladder was  removed from the  gallbladder bed. The bed was inspected and I saw no  leakage of bile or bleeding. The clips were on the appropriate  structures. The gallbladder was placed in an EndoCatch bag. I irrigated  out the gallbladder bed, it was hemostatic, and suctioned out the  irrigation. This was clear with no evidence of bleeding or bile. The  gallbladder was extracted through the umbilicus and passed off the  field. I closed the umbilical port with a pursestring suture that was  already in place. I inspected the gallbladder bed and found it to be  hemostatic. There is no excess fluid to suction out. No signs of solid  or hollow organ injury with inspection of the abdominal cavity at this  point. The 5-mm ports were removed. The camera was withdrawn, CO2 was  allowed to escape and the 11-mm subxiphoid port was removed. The skin  edges were closed with 4-0 Monocryl. Steri-Strips and dry dressings were  applied. All final counts of sponge, needle and instruments were found  to be correct at this portion of the case. The patient was awoke and  taken to recovery in satisfactory condition. The gallbladder was sent to  pathology for evaluation.      Thomas A. Cornett, M.D.  Electronically Signed     TAC/MEDQ  D:  09/01/2006  T:  09/01/2006  Job:  [id number]   cc:   Reginia Forts, M.D.  Fax: [phone]

## 2010-11-08 NOTE — Discharge Summary (Signed)
[place]  Patient:    Leroy Herrera, Leroy Herrera                     MRN: [id number] Adm. Date:  [id number] Disc. Date: [id number] Attending:   Cellar Dictator:   Vena Rua, P.A.-C. CC:         Stephens Shire, M.D.  [address], [place]  [id number]  Sandy Salaam. Deatra Ina, M.D. [facility]   Discharge Summary  ADMITTING DIAGNOSES: 1. Acute-on-chronic abdominal pain, nausea, vomiting, and diarrhea.  Rule out    small bowel obstruction, rule out IBS flare, rule out Crohns flare, rule    out renal stones. 2. Status post ileocolonic resection in 2000. 3. Status post exploratory laparotomy in August 2001 secondary to persistent    postoperative abdominal pain. 4. History of gastroesophageal reflux disease. 5. Questionable medication-seeking behavior. 6. Depression, anxiety with panic features. 7. History of nephrolithiasis, rule out active, symptomatic kidney stone. 8. History of idiopathic pancreatitis.  DISCHARGE DIAGNOSES: 1. Early partial small bowel obstruction versus ileus, resolved. 2. Acute abdominal pain, nausea, vomiting, and increase in diarrhea associated    with this, much improved. 3. Chronic, narcotics-requiring abdominal pain. 4. History of Crohns ileocolitis, status post ileocolectomy, no evidence for    active Crohns disease at present. 5. History of idiopathic pancreatitis, no biochemical evidence for recurrence.  HISTORY OF PRESENT ILLNESS:  Leroy Herrera is a 30 year old white male who has above-noted GI history significant for Crohns disease requiring ileocolonic resection and, later, because of chronic abdominal pain and exploratory laparotomy and lysis of adhesions.  Unfortunately, he has chronic abdominal pain. This had led to his seeking medical attention from Dr. Deatra Ina in June 2002.  Prior to that, he had been a patient of Dr. Oletta Lamas.  Patient and his family had  become disenchanted with Dr. Oletta Lamas because the patient continued to have persistent abdominal pain over which Dr. Oletta Lamas had no control.  in any event, he saw Dr. Deatra Ina in the office on May 18 and was complaining of persistent abdominal pain.  This pain was completely in line with pain which had led to multiple admissions in the year 2002 including January 9, January 14, February 22, and March 28, all brief admissions for pain management. Several CTs, small bowel follow-throughs, upper GI series, and a colonoscopy prior to 2002 had not revealed any evidence for activation of his Crohns disease.  He is maintained on Pentasa 1 g four times daily.  When he saw Dr. Deatra Ina in the office, Dr. Deatra Ina had none of his prior GI records.  He did add Entocort 3 mg three times a day at that time.  For pain management, the patient has been using about two to three Darvocets daily.  He also is treated for depression with panic features with Paxil.  He uses Imodium as needed.  Essentially, he stools about two to three times a day, unformed, soft stools.  In the last week or so prior to this admission, patient was having worsening of his right-sided abdominal pain, mostly in the lower quadrants, associated with increased stooling of a more watery stool but no blood.  He was also throwing up but was not having any hematemesis or coffee-ground emesis.  The patient had been in contact with Dr. Guillermina [place] office a couple of times during the week of July 5.  Initially, he was given a prescription  for more Darvocet and Friday, July 6, he was given a limited prescription for Tylenol No. 3. However, the Tylenol No. 3. did cause him to have a rash and some swelling of the lymph nodes in his neck, so he stopped it.  Patient was quite miserable over the weekend and, when he called the office on Monday, July 8, Dr. Deatra Ina arranged for him to be directly admitted, concerned that his Crohns disease may be reactivated  or that he may have a small bowel obstruction.  LABORATORY DATA:  A urinalysis was negative, amylase was 52, lipase 23.  A CMET was entirely within normal limits with BUN of 15, creatinine of 1.0, potassium 4.0, glucose 90, total protein 6.8, and albumin 4.0, both within normal limits.  Total bilirubin 0.7, alkaline phosphatase 98, AST 20, ALT 22. White blood cell count 7.3, hemoglobin 15.2, hematocrit 43.2, platelets 236, MCV 84.5.  Differential on the CBC within normal limits.  Erythrocyte sedimentation rate was 1.  Imaging studies:  Initial acute abdominal series showed findings consistent with partial mechanical small bowel obstruction.  A follow-up two-view abdomen showed slight interval improvement.  Findings are nonspecific but could reflect a resolving low-grade partial small bowel obstruction versus ileus.  HOSPITAL COURSE:  Patient was admitted to unit 3000 and placed on a diet of clear liquids and IV fluids.  Once the x-ray findings showed the partial obstruction, he was made n.p.o.  Patient was comfortable with p.r.n. Darvocet and p.r.n. Phenergan.  He had some nausea but no further emesis.  Within 24 hours, his stools had slowed up and were of more substance.  By hospital day #3, stools were just about back to normal which is again two to three soft, unformed stools per day.  Pain by day #3 was not yet completely managed with Darvocet.  The x-ray on hospital day #3, showed complete resolution of bowel obstruction.  Regarding patients chronic pain and management of the pain, Dr. Henrene Pastor had a long discussion with the patient and his family.  Essentially, the patient is headed in the direction of chronic narcotics for pain management which may  exacerbate his symptoms because of its paralytic effect on the bowel and his tendency to obstruction versus ileus.  From the labs, there was no evidence that his pain is coming from activation of his Crohns.  This, again, confirms what  has been found on several admissions earlier this year to Dr. Oletta Lamas, that the patient is not having Crohns activation as the etiology for his pain, nor is he having recurrent pancreatitis or kidney stones.  Plan at present is to obtain an appointment with [facility] to see if there are some other strategies we can use for pain management.  By hospital day #4, the patients pain was quite tolerable though not resolved.  He was not having any nausea.  He was tolerating a low-residue diet and was ready for discharge.  Follow-up appointments are to be arranged with the [facility] as well as with Dr. Deatra Ina.  For refills of his Darvocet, patient was advised to contact is primary physician and, perhaps in the future, [facility] will be agreeable to dosing appropriate analgesics.  For now, a limited supply of Darvocet will be provided to the patient.  Because there is no evidence of activation of Crohns disease, his Entocort was discontinued during this admission.  DISCHARGE MEDICATIONS: 1. Pentasa 250 mg four p.o. q.i.d. 2. Darvocet-N 100 two to three  p.o. q.d. p.r.n. for pain.  A prescription for    45 of these was provided. 3. Paxil 30 mg p.o. q.d. 4. Imodium p.r.n.  ACTIVITY:  Patient is allowed to return to work on Monday, 7/15, with no lifting restrictions.  DIET:  Should be an ongoing, low-residue diet and avoidance of dairy products and excessive amounts of fatty foods. DD:  12/31/00 TD:  12/31/00 Job: [id number]

## 2010-11-08 NOTE — H&P (Signed)
[facility]  Patient:    TIRAS, BIANCHINI                     MRN: [id number] Adm. Date:  [id number] Attending:  Sherrin Daisy Dictator:   Evalee Jefferson, P.A. CC:         Tammy R. Modena Morrow, M.D.             Darene Lamer. Hoxworth, M.D.                         History and Physical  DATE OF BIRTH:  04-25-1981.  CHIEF COMPLAINT:  Abdominal pain.  HISTORY:  Ms. Cothern is a 30 year old white male with a history of Crohns ileocolitis who is approximately one year so ileocolectomy, who presents today o [facility] room with a two-day history of right lower quadrant and left-sided abdominal pain which has been intermittent in character, lasting approximately one to two hours, then completely subsiding for an hour or two. e describes the pain as very dull yet intense, rating it as an 8/10 on a 10-point  pain scale.  This pain has no radiation and he has had no recognized alleviators for these episodes of pain.  Of note, he does mention that his current symptoms are very reminiscent of the symptoms he experienced previous to his acute flare of Crohns, which occurred one year ago, and requiring an ileocolectomy.  Additionally, he reports that his bowel habits have also changed from approximately one to two bowel movements per day to four to six very loose stools and he also  experiences a mild sense of urgency without incontinence of stool, and has had o evidence of hematochezia, although he does report that two bowel movements on Friday were very dark in character, but since then, he has had brown stool with a very small amount of mucus mixed in.  He has been able to tolerate p.o. liquids and solids without nausea or emesis but does recognize that he has had a decrease in appetite over the past week or so.  He has had no fevers or chills.  He also does report dysphagia for which an upper GI series was obtained on October 14, 1999  which ruled out any anatomical causes, and he was recently placed on Valium by his primary care physician (5 mg t.i.d.), which he actually is using on a p.r.n. basis, usually one or two tablets per day, and has responded to this regimen.  He also reports very rare episodes of reflux-type symptoms for which he uses p.r.n. Protonix.  The last episode of reflux occurred approximately two weeks ago.  He is maintained at home on a daily course of Pentasa 250 mg, #4 tablets q.i.d., and has not missed any doses of this regimen.  PAST MEDICAL HISTORY: 1. Crohns ileocolitis. 2. History of prostatitis. 3. Dysphagia. 4. Distant history of pancreatitis.  PAST SURGICAL HISTORY: 1. T&A in 1988. 2. Ileocolectomy in June of 2000.  CURRENT MEDICATIONS: 1. Pentasa 250 mg tablets, four tablets q.i.d. 2. Protonix 40 mg p.r.n. 3. Valium 5 mg tablets p.r.n.  ALLERGIES:  MORPHINE.  FAMILY HISTORY:  Negative family history for gallstones, peptic ulcer disease, colon cancer and inflammatory bowel disease.  SOCIAL HISTORY:  He is a Ship broker at [employer], and of note, is scheduled to graduate  from his program this coming Friday.  He is also employed as a Architect at [employer].  He denies ETOH and cigarette use.  REVIEW OF SYSTEMS:  Negative for chest pain, shortness of breath, hematochezia,  rashes, joint pain, odynophagia, mouth sores, anorexia, nausea, vomiting and dizziness.  Positive for change in bowel habits.  Significant for increased frequency and looseness of stools.  Positive abdominal pain.  Positive fatigue he reports mild fatigue over the past several weeks, possibly stress- and work- and school-related), and plus-or-minus history of melena from two days ago.  PHYSICAL EXAMINATION:  GENERAL:  Patient is alert and oriented and in no acute distress.  VITAL SIGNS:  Temperature 98.6, pulse of 76, blood pressure 125/84 and respirations of 12.  HEENT:  He is anicteric and has  no conjunctival pallor.  Oropharynx is benign without lesions, exudates or erythema.  NECK:  There is no cervical or supraclavicular lymphadenopathy and no thyromegaly noted.  LUNGS:  Clear to auscultation bilaterally without rhonchi, wheezes or rales.  HEART:  Regular in rate and rhythm without murmurs, rubs, or gallops.  ABDOMEN:  Subjective tenderness over the right lower quadrant and left lower quadrant without guarding or rebound, normoactive bowel sounds, no succussion splash, no hepatosplenomegaly and normal tympany to percussion.  RECTAL:  Exam reveals an empty ampulla, normal prostate and heme-negative loose  brown stool.  EXTREMITIES:  No edema.  LABORATORY AND X-RAY FINDINGS:  CMET with the following values of sodium of 138, potassium of 4.5, chloride of 107 and a CO2 of 26, BUN of 16 and a creatinine of 1.1 with a glucose of 86.  Albumin is 4.5, AST 24, ALT 20, alkaline phosphatase  119, total bilirubin of 1.0, amylase 48, lipase 11.  A CBC was also unrevealing, with a WBC of 6.4 and hemoglobin of 16.2, hematocrit of 50.4 and a platelet count of 249,000.  His urinalysis was negative and his sed rate is currently still pending.  A three-way abdomen obtained on his arrival was also negative for any evidence f free air, no evidence of obstruction, but there was a moderate amount of stool throughout the colon.  ASSESSMENT:  This is a 30 year old white male with a two-day history of intensified right lower quadrant and left-sided abdominal pain with increased bowel movements and a generalized fatigue over the past several weeks, with a history of Crohns  ileocolitis, significant for an ileocolectomy performed one year ago.  PLAN:  Patient will be admitted to Dr. Jeneen Rinks L[facility] service for management f symptoms, which are presumably due to a flare in his Crohns ileocolitis. Depending on his response to supportive care, he may or may not  undergo colonoscopy or a small-bowel follow-through for further diagnostic purposes. DD:  10/27/99 TD:  10/28/99  Job: [id number]

## 2010-11-08 NOTE — Consult Note (Signed)
[facility]  Patient:    Leroy Herrera, Leroy Herrera                     MRN: [id number] Proc. Date: 09/17/00 Adm. Date:  [id number] Disc. Date: [id number] Attending:  Eustaquio Maize CC:         Tammy R. Modena Morrow, M.D.  Thomas C. Wall, M.D. [facility]   Consultation Report  DATE OF BIRTH:  1981/01/22.  CHIEF COMPLAINT:  Syncope.  HISTORY OF THE PRESENT CONDITION:  I was asked by Dr. Jae Dire. 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  He had two episodes while urinating of syncope without warning.  He lost consciousness for a period of up to three minutes. In the first episode, there was absolutely no warning; the second, the patient gasped for air and complained of chest pain.  Patient had severe headaches that followed the syncopal episode, in part it was because he fell and struck his head, in part the head pain extended way beyond the area where the head was hit.  Patient also had tightness or chest pain associated with nausea for about 15 minutes in duration, but these were not associated with the syncopal episodes.  Indeed, the pain that the patient had post event was not migrainous, as he understands his migraines.  Patient had no tonic-clonic activity with the episodes.  He did not lose control of his bowels and bladder.  He did not bite his tongue.  Past medical history:  The patient had episodes of shaking that were seizure like when he was less than a year of age.  His eyes rolled back.  Patient was evaluated and no further workup was carried out for reasons that are unclear to me.  His mother brought him in because he seemed to be somewhat disoriented, groggy, sluggish, hard to arouse and mentally sluggish after these episodes.  Past medical history is also remarkable for panic attacks.  PAST  SURGICAL HISTORY:  Ileal resection in Sep 16, 1997, then lysis of adhesions in 09-16-1998.  He has passed four kidney stones in the past month.  CURRENT MEDICATIONS: 1. Paxil 20 mg per day. 2. Nexium 40 mg per day. 3. Pentasa ______ one four times a day. 4. NuLev 0.125 mg four times a day as needed. 5. Tylenol and Vicodin as needed (he has been using Vicodin in the hospital).  ALLERGIES:  MORPHINE (rash), PHENERGAN caused hallucinations and is more of an intolerance.  Interestingly, he tolerates IV Phenergan rather than p.o., which I do not understand.  SOCIAL HISTORY:  The patient is an Cabin crew.  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  He smokes one-half pack of cigarettes per day.  He does not use alcohol or drugs.  Patient is a high Printmaker.  He is single.  FAMILY HISTORY:  Both parents and maternal grandmother and maternal uncle have hypertension.  Both parents have diabetes.  Maternal grandmother and maternal uncle have diabetes.  Father has nephrolithiasis.  Paternal grandmother died of stomach cancer.  Maternal grandfather has had renal cell cancer and cardiac arrhythmia.  Maternal grandmother died at age 28 of complications of diabetes and renal disease.  Maternal grandfather has chronic lymphocytic leukemia. There is history of myocardial infarctions in maternal uncles and paternal grandfather had diabetes and lung cancer.  REVIEW OF SYSTEMS:  Review of  systems is remarkable for a minimal cough, mild abdominal pain with nausea and diarrhea, low back pain and dysuria in the past, not since passing stones.  Review of systems is otherwise negative.  PHYSICAL EXAMINATION:  GENERAL:  On examination today, a pleasant, well-developed, right-handed young man in no distress, blond-haired, blue-eyed.  VITAL SIGNS:  Temperature 97.1.  Blood pressure 139/66; earlier in the day, the patient showed evidence of orthostatic blood pressures.  Resting pulse 68. Respirations 20.  Pulse  oximetry 99%.  HEENT:  Patient appears to have right otitis externa and an inflamed right umbo, suggesting otitis media on the right.  He had slight abrasions on his forehead.  NECK:  Supple.  No bruits.  LUNGS:  Clear.  HEART:  No murmurs.  Pulse is normal.  ABDOMEN:  Soft.  Bowel sounds normal.  EXTREMITIES:  Extremities are well-formed without edema, cyanosis, alterations in tone or tight heel cords.  NEUROLOGIC:  Mental status:  Patient was awake, alert, attentive, appropriate. No dysphasia or dyspraxia.  Cranial nerve examination:  Round, reactive pupils.  Normal fundi.  Full visual fields to double simultaneous stimuli. Extraocular movements full and conjugate, okay and responses equal bilaterally.  Symmetric facial strength and sensation.  Air conduction greater than bone conduction bilaterally.  Motor examination:  Normal strength, tone and mass.  Good fine motor movements.  No pronator drift.  Sensation intact to cold, vibration and stereoagnosis with the exception of a stocking neuropathy to the calf for cold and an ankle neuropathy for vibration.  Patient had good proprioception and stereoagnosis.  Two-point discrimination was 3 to 4 mm bilaterally. Cerebellar examination showed good finger-to-nose and rapid repetitive alternating movements.  Gait was normal.  He can walk on his heels and toes and perform a tandem without difficulty.  Deep tendon reflexes were normal to brisk, even at the ankles.  He had bilateral flexor plantar responses.  IMPRESSION: 1. Syncope, possible etiologies including micturition type, neurocardiogenic.    I doubt vertebrobasilar insufficiency, basilar migraine, seizures or a    colloid cyst of the III ventricle.  The latter two have some laboratory to    support that, including a normal electroencephalography and normal CT    brain. 2. Migraines without aura.  Patient has had this for years and is having them     about once per week  now. 3. Right otitis externa and probable right otitis media. 4. Orthostatic hypotension. 5. Mild peripheral polyneuropathy, stocking distribution. 6. Mild closed head injury without clear-cut sequelae.  SUGGESTION: 1. If the patients syncope persists, the patient would benefit from an event    recorder or a Holter monitor.  If neurocardiogenic syncope becomes more    suspected, then a tilt table test is the gold standard. 2. I would consider using ______ medications for the patients severe    headaches.  I would be reluctant to use beta blocker or calcium channel    blockers with the patients orthostatic hypotension. 3. Consider treating the infections in the auditory canal and middle ear, if    you agree. 4. If orthostatic symptoms persist and worsen, you could consider Mitodrine,    Florinef or just four 8-ounces glasses of Gatorade.  I would continue to    check orthostatic blood pressures every shift, as you are doing now. 5. Peripheral polyneuropathy is very mild.  The family has a history of    diabetes mellitus and we can evaluate the patient with hemoglobin A1c,    though  the random glucose was normal, and it is likely this will be normal.    Other workup for neuropathy could include TSH, vitamin B12, red blood cell    folic acid, VDRL, sedimentation rate, serum protein and serum    immunoelectrophoresis, urine heavy metals, all of which have full yield. 6. There is really nothing to do about the closed head injury, it seems fairly    mild and I do not see any sequelae at this time.  I appreciate the opportunity to see Mr. Mochizuki.  If you have questions about this or if I can be of assistance, do not hesitate to contact me.  DD: 09/17/00 TD:  09/18/00 Job: [id number]

## 2010-11-08 NOTE — H&P (Signed)
[facility]  Patient:    Leroy Herrera, Leroy Herrera                       MRN: [id number] Adm. Date:  08/14/00 Attending:  Elyse Jarvis. Amedeo Plenty, M.D. CC:         Joyice Faster. Oletta Lamas, M.D.  Darene Lamer. Hoxworth, M.D.   History and Physical  CHIEF COMPLAINT:  Right lower quadrant abdominal pain and vomiting.  HISTORY OF PRESENT ILLNESS:  The patient is a 30 year old white male with a history of Crohns disease of the terminal ileum, status post resection two years ago who presents with a one day history of worsening right lower quadrant abdominal pain, culminating in nausea and vomiting and subjective chills.  He came to the emergency room and was given IV Demerol with temporary relief of his pain.  He denies any hematemesis, diarrhea, or abdominal distension.  His only recent medication is Pentasa for his Crohns disease. He has had one flare requiring hospitalization and a brief course of steroids since his surgery.  He has otherwise been in good health.  PAST MEDICAL HISTORY:  Unremarkable except for Crohns disease and mild depression.  PAST SURGICAL HISTORY:  Terminal ileal resection two years ago with laparoscopic lysis of adhesions one year ago.  MEDICATIONS:  Pentasa and Paxil.  ALLERGIES:  MORPHINE, P.O. PHENERGAN.  SOCIAL HISTORY:  The patient is engaged.  He works as an Cabin crew.  He denies alcohol but does smoke cigarettes.  FAMILY HISTORY:  Positive for diabetes in both parents.  Negative for inflammatory bowel disease.  Grandmother had stomach cancer.  PHYSICAL EXAMINATION:  GENERAL:  Well-developed, well-nourished white male in no acute distress after receiving Demerol.  VITAL SIGNS:  Blood pressure 138/99, pulse 109, respirations 20, temperature 98.5.  HEENT:  Unremarkable.  HEART:  Regular rate and rhythm without murmur.  ABDOMEN:  Soft, nondistended, with hypoactive bowel sounds.  There is mild right lower quadrant tenderness, and to a  lesser degree, left mid abdominal tenderness.  EXTREMITIES:  Without clubbing, cyanosis, or edema.  LABORATORY DATA:  CMET and amylase within normal limits.  CBC pending.  IMPRESSION:  Probable flare of Crohns disease.  Rule out bowel obstruction, perforation, localized abscess, etc.  PLAN:  We will admit for IV hydration and supportive care.  We will obtain plain abdominal films and if significant leukocytosis or develops fever or worsening abdominal pain, will need CT scan, and we will consider IV steroids after all initial data returns. DD:  08/14/00 TD:  08/17/00 Job: [id number]

## 2010-11-08 NOTE — H&P (Signed)
[place]  Patient:    Leroy Herrera, Leroy Herrera                     MRN: [id number] Adm. Date:  [id number] Attending:   Cellar Dictator:   Vena Rua, P.A.-C.                         History and Physical  DATE OF BIRTH:  06-Oct-1980  PRIMARY PHYSICIAN:  Stephens Shire, M.D.  PRIMARY GASTROINTESTINAL PHYSICIAN:  Sandy Salaam. Deatra Ina, M.D.  CHIEF COMPLAINT:  Abdominal pain, diarrhea, nausea, vomiting.  HISTORY OF PRESENT ILLNESS:  Leroy Herrera is a 30 year old white male who is status post ileocolonic resection in 2000 for Crohns disease.  He also underwent an August 2001 exploratory laparotomy with lysis of adhesions because of persistent complaints requiring multiple admissions with abdominal pain.  He did not have a small bowel obstruction.  From gleaning through records available, his most recent colonoscopy in May 2001 was negative.  He has had multiple repeated studies including small bowel follow-throughs, upper GI, CT scans.  Essentially, these have been negative though one of the CT scans of May 2001 did show some duodenitis and he has been treated in the past successfully for reflux symptoms with proton pump inhibitors.  He was a patient of Dr. Oletta Lamas but apparently the patients family was unhappy with Dr. Oletta Lamas - they felt that he did not respond quickly enough to calls.  I think there was some high level of frustration because the patient has recurrent symptoms and Dr. Oletta Lamas was not able to solve the patients problems.  In any event, he sought care elsewhere and was seen to establish care by Dr. Deatra Ina in the office on May 18.  No records from Dr. Oletta Lamas were available during that admission but he was continued on Pentasa and Entocort was added to his medical regimen.  Since that time the patient still has persistent abdominal pain for which he uses about two to three Darvocet a day.  However, he has been able to  return to work.  He has about two to three soft or loose bowel movements daily.  He does not have any reflux symptoms.  Over the last week, the patients abdominal pain has gotten worse.  He called the office and got a new prescription for Darvocet.  On Friday he was given a prescription for Tylenol No. 3 which apparently caused him some swelling in his neck as well as some rash and he resorted back to the Darvocet.  Darvocet is not able to control the pain at present.  This morning he was at work and the pain was fairly bad in the right abdomen and he developed nausea and vomiting of bilious material and of his breakfast contents.  He called Dr. Guillermina City office and a direct admission was arranged for the patient.  Of note, it looks like he has had admissions July 01, 2000; July 06, 2000; August 14, 2000; and September 17, 2000 - all for fairly brief periods of time. During this time several CT scans have been performed, small bowel follow-throughs and upper GI series have been performed, and nothing has led to anything concrete being discovered.  Labs have been unremarkable.  He has had a history of renal stones but urinalysis has been negative during these recent admissions.  Patient does have a history a few years back of having  idiopathic pancreatitis.  PAST MEDICAL HISTORY:  Crohns ileocolitis for which he is status post ileocolonic resection in 2000.  Status post exploratory laparotomy with lysis of adhesions secondary to chronic pain August 2001.  The surgeon is Dr. Excell Seltzer.  History of renal stones.  Depression.  Gastroesophageal reflux disease.  Irritable bowel syndrome.  History of idiopathic pancreatitis.  ALLERGIES:  MORPHINE - which causes rash, and to TYLENOL NO. 3 - which causes swelling of the lymph nodes in the neck and pruritus.  FAMILY HISTORY:  Father and mother both have diabetes type 2; they are obese. Grandmother died age 15 secondary to stomach cancer.  Coronary  artery disease in some of his grandparents.  SOCIAL HISTORY:  The patient works as an Cabin crew.  He smokes half a pack per day of cigarettes.  He denies alcohol consumption or illicit drug use.  MEDICATIONS: 1. Pentasa 250 mg four p.o. q.i.d. 2. Entocort 3 mg p.o. t.i.d. 3. Darvocet-N 100 two to three p.o. q.d. p.r.n. 4. Paxil 30 mg p.o. q.d. 5. Imodium p.r.n.  REVIEW OF SYSTEMS:  Chronic back pain.  Currently not having any heart burn or dysphagia.  No dysuria or foul-smelling urine.  No extremity swelling.  No rash.  PHYSICAL EXAMINATION:  GENERAL:  Patient is actually a well-looking, comfortable white male.  His hair looks to be dyed blond.  VITAL SIGNS:  Weight is 190 pounds.  Blood pressure 134/94, respirations 16, pulse 82.  HEENT:  Negative for scleral icterus or conjunctival pallor.  Extraocular movements are intact.  Oropharynx mucosa is moist and clear.  NECK:  There are no masses, no JVD or bruits.  No thyromegaly.  CHEST:  Clear to auscultation and percussion bilaterally.  CARDIAC:  There is respiratory rate without murmurs, rubs, or gallops.  ABDOMEN:  Soft with mild tenderness in the right lower quadrant associated with no guarding, rebound, or masses.  No hepatosplenomegaly.  RECTAL:  Notable for scant specimen of brown stool which fecal occult blood negative.  No masses.  EXTREMITIES:  No cyanosis, clubbing, or edema.  NEUROLOGIC:  No tremor, no confusion.  Patients strength is grossly within normal limits.  LABORATORY DATA:  All pending.  These include a sed rate, CBC, CMET, amylase, lipase, and urinalysis, as well as a drug toxicity screen.  IMPRESSION: 1. Chronic abdominal pain, now with worsening symptoms associated as well with    nausea, vomiting, and diarrhea.  Rule out small bowel obstruction, rule    out irritable bowel syndrome flare, rule out renal stones. 2. Status post ileocolonic resection in 2000. 3. Status post exploratory  laparotomy August 2001 secondary to persistent    postoperative abdominal pain. 4. History of gastroesophageal reflux disease symptoms.  5. Question medication-seeking behavior. 6. Depression. 7. History of kidney stones, rule out active symptomatic kidney stone.  PLAN:  Get a three-way abdomen with chest film.  Order labs which are pending and listed above.  Start IV fluids and limit diet to clear liquids. DD:  12/28/00 TD:  12/28/00 Job: [id number]

## 2010-11-08 NOTE — Discharge Summary (Signed)
[place]  Patient:    Leroy Herrera, Leroy Herrera                     MRN: [id number] Adm. Date:  [id number] Disc. Date: [id number] Attending:  Sherrin Daisy CC:         Darene Lamer. Hoxworth, M.D.             Stephens Shire, M.D.                           Discharge Summary  CONSULTATIONS: Darene Lamer. Hoxworth, M.D.  ADMISSION DIAGNOSIS: Abdominal pain.  FINAL DIAGNOSES:  1. Abdominal pain, etiology unclear; suspect either irritable bowel syndrome,     intra-abdominal adhesions, or some combination.  2. Crohns disease, status post ileocolonic resection.  HISTORY OF PRESENT ILLNESS: This patient is a nice 30 year old gentleman who has a history of Crohns ileocolitis, one year status post ileocolectomy.  He has had continued right lower quadrant pain since then.  Over the past year this comes and goes, and previous work-up including CT and Small Bowel Series had all been negative.  He was admitted on Oct 27, 1999 by Dr. Cristina Gong with two days of severe abdominal pain that doubled him over.  PHYSICAL EXAMINATION:  VITAL SIGNS: Temperature 98.6 degrees, pulse 76, blood pressure 125/84.  GENERAL/HEENT/HEART/LUNGS: Normal.  ABDOMEN: Marked tenderness in the right lower quadrant, without guarding or rebound.  RECTAL: Stool Hemoccult negative.  For more details please see the History and Physical done on admission.  HOSPITAL COURSE: The patient was admitted to a medical floor and initial laboratories revealed a normal WBC, normal sedimentation rate, and CMET was also completely normal.  Urinalysis was negative as well.  The patient was given pain medications, specifically IV Demerol and Phenergan, and in the thought he may have active Crohns disease was given IV Solu-Medrol.  Due to concern about recurrent Crohns disease a colonoscopy was performed on Oct 28, 1999, which was negative.  We were able to get to the anastomosis without difficulty.   There were no signs of active Crohns at 10 cm up into the small intestines.  A Small Bowel Series was obtained, which was negative for active Crohns.  The patient did have esophageal reflux and he had previously been treated with a Proton pump inhibitor.  He was started on a low residual diet and continued still to have some pain, and was given Librax with some improvement in his pain.  He was seen in consultation by Dr. Excell Seltzer of surgery and Dr. Excell Seltzer agreed that with the extensive in-hospital and pre-hospital admission work-up the most likely diagnosis was irritable bowel syndrome or abdominal pain due to adhesions from his previous surgery.  He was improving with the current treatment and we felt we probably could observe him further as an outpatient on suppressive therapy for irritable bowel.  We discussed this with Iona Beard, and he agreed with this approach.  DISCHARGE CONDITION: Improved, tolerating a low residue diet, with minimal pain.  DISCHARGE MEDICATIONS:  1. Protonix 40 mg q.d.  2. Librax 1 tablet t.i.d. to q.i.d.  3. Pentasa 250 mg 2 tablets q.i.d.  4. He has Valium at home, which he will take if needed.  5. Vicodin 1 tablet q.6h for severe pain.  6. Ambien 10 mg q.h.s. for sleep.  FOLLOW-UP: He will follow up in the office with Dr. Oletta Lamas in three to four  weeks, and will call if his pain returns. DD:  10/31/99 TD:  11/01/99 Job: [id number]

## 2010-11-08 NOTE — H&P (Signed)
[facility]  Patient:    Leroy Herrera, Leroy Herrera                       MRN: [id number] Adm. Date:  07/01/00 Attending:  Jeneen Rinks L. Oletta Lamas, M.D. CC:         Youlanda Roys. Deatra Ina, M.D.  Darene Lamer. Hoxworth, M.D.   History and Physical  REASON FOR ADMISSION:  Severe nausea, vomiting and abdominal pain.  HISTORY:  Patient is a 30 year old with Crohns disease.  This was diagnosed several years ago and he had failed medical therapy as an outpatient and in June of 2000, ended up with a resection of his terminal ileum to cecum due to lack of medical response.   He did well for some time and then nearly a year later, started developing recurrent abdominal pain with no signs of active Crohns disease.  He had multiple admissions for this, would always improve and then gradually his symptoms returned.  This ultimately led to a laparoscopy with the finding of intra-abdominal adhesions with lysis of these adhesions in August of 2001 by Dr. Marland Kitchen T. Hoxworth.  The patient had gone home and did extraordinarily well following that procedure, without any evidence of any continuing abdominal pain.  He has remained on Pentasa prophylactically to try to prevent recurrence.  Today, he had fairly sudden onset of severe cramping abdominal pain that doubled him over.  It is located in the right side of his abdomen and he has had persistent vomiting, has not passed any air, nor has he had any diarrhea.  He has had no fever.  He did have chills with this this morning, however.  CURRENT MEDICATIONS 1. Pentasa four tablets q.i.d. 2. Paxil 20 mg daily.  ALLERGIES:  He has no drug allergies.  MEDICAL HISTORY:  Primarily remarkable for Crohns disease.  He has not had any other medical problems other than a history of pancreatitis that was primarily diagnosed with abdominal pain and elevated amylase; it was not clear if this was pancreatitis or what, but he resolved with conservative  therapy.  PAST SURGICAL HISTORY:  Other surgeries include a tonsillectomy and adenoidectomy and surgery for Crohns with resection of the terminal ileum and cecum and subsequent lysis of adhesions.  FAMILY HISTORY:  Both parents have diabetes.  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 He does not smoke or drink.  PHYSICAL EXAMINATION  VITAL SIGNS:  Temperature 97, respirations 16, pulse 77, blood pressure 137/89.  GENERAL:  Somewhat pale-appearing white male who does appear to be nauseated. He is retching a bit.  HEENT:  Eyes:  Sclerae nonicteric.  Extraocular movements intact.  NECK:  Supple.  No lymphadenopathy.  LUNGS:  Clear.  HEART:  Regular rate and rhythm without murmurs or gallops.  ABDOMEN:  Well-healed surgical scar.  Auscultation reveals markedly increased bowel sounds.  LABORATORY AND X-RAY FINDINGS:  Acute abdominal series:  No evidence of obstruction or free air.  White count within the normal range.  ASSESSMENT:  Nausea, vomiting and severe cramping abdominal pain.  Hopefully, this is just a course of gastroenteritis, hopefully viral.  He has not had any diarrhea, which somewhat goes against that.  At this point, I think that it is less likely that he has an obstruction with negative x-rays, but I think it certainly would be prudent to put him in and observe.  PLAN:  Will admit, give IV fluids, medication for pain and sips of clear liquids and make further recommendations depending on his clinical course. DD:  07/01/00 TD:  07/01/00 Job: [id number]

## 2010-11-08 NOTE — H&P (Signed)
[facility]  Patient:    Leroy Herrera, Leroy Herrera                     MRN: [id number] Proc. Date: 07/06/00 Adm. Date:  [id number] Disc. Date: [id number] Attending:  Sherrin Daisy CC:         Youlanda Roys. Deatra Ina, M.D.  Darene Lamer. Hoxworth, M.D.   History and Physical  REASON FOR ADMISSION:  Abdominal pain.  HISTORY OF PRESENT ILLNESS:  A 30 year old with Crohns disease diagnosed several years ago.  He failed medical therapy and ultimately ended up with an operation in 2000 that consisted of resection of the terminal ileum and cecum. He did well for a year.  He subsequently developed recurrent abdominal pain and this ultimately resulted in several hospitalizations and extensive work-up.  He underwent a laparoscopy and lysis of adhesions in August of 2001 and did extraordinarily well following that.  He has been seen in the office a couple of times and has not had any problem whatsoever until about a week ago. He had fairly sudden onset of right-sided abdominal pain that doubled him over in the right side of his abdomen with persistent vomiting.  We hospitalized him for several days last week.  He had an acute abdominal series that was negative.  All of his labs were repetitively normal, including urinalysis and sedimentation rate.  He was discharged home on Friday on Pentasa, Paxil, and Phenergan.  He is taking p.r.n. Protonix at home for reflux symptoms.  He did well for 24 hours. He apparently became a bit dizzy from the Phenergan which we held.  He ate yesterday, felt pretty good, and felt good enough to go to work today, which was planned.  He woke up, felt nauseated, vomited, and then subsequently developed severe pain in his lower abdomen with cramping.  He is not passing air.  He has repetitive vomiting throughout the day.  We went ahead and made arrangements to admit him to the hospital.  CURRENT MEDICATIONS:  Pentasa four tablets q.i.d., Paxil 20 mg  daily, Protonix p.r.n.  PAST MEDICAL HISTORY:  1. Crohns disease, status post resection of the cecum and terminal ileum in June of 2000.  2. Lysis of adhesions laparoscopically in August of 2001.  No other medical problems.  FAMILY HISTORY:  Both parents have non-insulin-dependent diabetes mellitus. 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  REVIEW OF SYSTEMS:  Essentially unremarkable.  PHYSICAL EXAMINATION:  The patient is afebrile.  Temperature 96.6 degrees, pulse 80, blood pressure 126/98.  GENERAL APPEARANCE:  He is in no obvious distress.  HEENT:  The eyes are clear and nonicteric.  Extraocular movements intact.  NECK:  Supple.  No lymphadenopathy.  LUNGS:  Clear.  HEART:  Regular rate and rhythm without murmurs or gallops.  ABDOMEN:  Nondistended.  Soft with apparent tenderness in the left upper quadrant and left lower quadrant.  No guarding, rigidity, or rebound.  EXTREMITIES:  No edema.  NEUROLOGIC:  Grossly normal.  LABORATORY DATA:  Labs and acute abdominal series all pending.  ASSESSMENT:  Recurrent abdominal pain.  This could be due to recurrent Crohns.  It is less likely that it is an ulcer, but it certainly could be.  I I think that we need to consider biliary tract disease, as well as other potential causes.  PLAN:  Will admit and control  his symptoms.  Will give IV proton pump inhibitors.  Will obtain a small bowel series, upper GI, and an ultrasound tomorrow. DD:  07/06/00 TD:  07/06/00 Job: [id number]

## 2010-11-08 NOTE — Discharge Summary (Signed)
[facility]  Patient:    Leroy Herrera, Leroy Herrera                     MRN: [id number] Adm. Date:  [id number] Disc. Date: [id number] Attending:  Sherrin Daisy CC:         Stephens Shire, M.D.                           Discharge Summary  REASON FOR ADMISSION:  Nausea and vomiting and abdominal pain.  FINAL DIAGNOSIS:  Nausea and vomiting and abdominal pain, resolved.  Etiology undetermined.  HOSPITAL COURSE:  The patient is a young man with Crohns disease who has undergone previous surgery.  Subsequently, had lysis of adhesions a year later.  He had done well.  He presented with sudden onset of cramping abdominal pain that doubled him over, and was with marked vomiting.  Due to concerns over recurrent of Crohns, obstruction, etc., he was admitted for observation.  Labs revealed a white count of 6.5, normal sedimentation rate, normal urinalysis.  X-rays were obtained, revealing no obvious obstruction. There was no free air.  The patient rapidly improved with pain medication and bowel rest.  White count remained normal.  He still had some nausea.  We advanced his diet and he tolerated this well, and it was felt that he was in satisfactory condition for discharge home with followup as an outpatient.  DISPOSITION:  The patient is discharged home in improved condition.  It is felt that he may of had simply a gastroenteritis.  It is felt that he can be followed up as an outpatient.  DISCHARGE MEDICATIONS: 1. Pentasa 1 g q.i.d. 2. Paxil 20 mg q.d. 3. Phenergan p.r.n.  FOLLOWUP:  See Dr. Oletta Lamas in the office in 4 to 6 weeks.  Make an appointment.DD:  07/26/00 TD:  07/28/00 Job: [id number]

## 2010-11-08 NOTE — Consult Note (Signed)
[facility]  Patient:    Leroy Herrera, Leroy Herrera                     MRN: [id number] Proc. Date: 10/18/00 Adm. Date:  [id number] Disc. Date: [id number] Attending:  Tor Netters CC:         Joyice Faster Oletta Lamas, M.D.   Consultation Report  REASON FOR CONSULTATION:  Mr. Nohr is a 30 year old male with known Crohns disease who called me complaining of nausea, increasing diarrhea and hematochezia and requesting to be seen in the emergency room. Recently, he has been treated for calcium oxalate stones. He also has a history of micturition syncope and anxiety. He says that he is having at least five loose watery bowel movements per day and only the last bowel movement had significant blood in it. He has not had any syncope or orthostatic symptoms. He says he is having left sided abdominal pain, mostly in the lower quadrant.  Recent evaluation in March of this year was a CT scan demonstrating diverticulosis of the sigmoid colon with no abscesses seen, an upper GI series with small bowel follow-through in February demonstrated no abnormalities other than the absent terminal ileum secondary to surgery. He has reflux disease for which he takes Protonix. He is on pentasa 16 pills a day. He also takes lorazepam p.r.n. and Paxil.  PHYSICAL EXAMINATION:  GENERAL:  On physical examination he is in in no acute distress.  VITAL SIGNS:  Patient is afebrile with a blood pressure 147/85, pulse is 87, respiratory rate 18.  HEENT:  Eyes are anicteric. Oropharynx unremarkable.  LUNGS:  Chest clear.  CARDIOVASCULAR:  Heart sounds regular rate and rhythm.  ABDOMEN:  Abdomen is not distended. There are normal bowel sounds. There is questionably minimal tenderness to palpation of the left side. There is no rebound or mass.  RECTAL:  No external lesions or fistulae. Stool is light brown in color and there is pinkish mucus on the glove.  LABORATORY AND ACCESSORY DATA:   Laboratory tests reveal a hemoglobin of 14.7 with a white blood cell count of 7.1 and platelet count is normal. Basic metabolic panel is normal. Amylase is 59.  IMPRESSION:  History of Crohns disease with no clear evidence of active Crohns at this time. Minimal rectal bleeding which might be secondary to an internal hemorrhoid, although, the possibility of Crohns extension to the left colon remains. I do not think the patient warrants admission at this time, although, he complains of nausea, he has had no vomiting in the emergency room in the last two hours and there are no signs of obstruction on physical examination.  PLAN:  I am placing him on chlorpromazine 10 mg every six hours p.r.n. nausea and I have prescribed Rowasa enemas, one per night. Follow-up should be with Dr. Oletta Lamas in the office who will decide whether or not he needs further evaluation for extension of Crohns to the colon. DD:  10/18/00 TD:  10/19/00 Job: [id number]

## 2010-11-08 NOTE — H&P (Signed)
[facility]  Patient:    Leroy Herrera, Leroy Herrera                    MRN: [id number] Adm. Date:  09/17/00 Attending:  Jae Dire. Modena Morrow, M.D.                         History and Physical  CHIEF COMPLAINT:  Fainted.  HISTORY OF PRESENT ILLNESS:  Leroy Herrera is a 30 year old white male with a long-standing history of Crohns disease and recent problems with nephrolithiasis over the past month.  He has felt a little bit dizzy when he has passed kidney stones.  There is some pain.  Also complaints of some low back pain now that has been more ongoing but yesterday and today has felt quite bad.  Has felt dizzy and light-headed every time that he stands, also sometimes when sitting and lying down.  Yesterday and today he had episodes where he passed out with no warning shortly after urination.  He was unconscious for about three minutes.  Mom says that the second time he gasped for air and then was complaining of some chest pain.  Leroy Herrera has complained of intermittent chest pain and today since these started lasting about 15 minutes and described as tightness or pressure with associated nausea, but no diaphoresis or shortness of breath.  No seizure activity was noted with these. Mom reports that when he was less than a year old he had several episodes with shaking which looked like seizure to her with eyes rolling back.  They were not associated with fever.  He was taken to the health department clinic.  No further workup was done.  He has not had this problem since he has been a year old.  After each episode he has been somewhat disoriented, groggy, and sluggish, hard to arouse with some slowing of his thoughts.  PAST MEDICAL HISTORY:  Crohns disease with resection in 1999 and adhesion lysis in 2000, panic attacks, nephrolithiasis.  ALLERGIES:  MORPHINE (rash), p.o. PHENERGAN (hallucinations, but can have IV Phenergan).  MEDICATIONS: 1. Paxil 20 mg q.d. 2. Nexium 40 mg  q.d. 3. Pentasa four p.o. q.i.d. 4. NuLev 0.125 mg q.i.d. p.r.n. 5. Tylenol p.r.n.  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 No alcohol.  No drugs.  FAMILY HISTORY:  Positive for hypertension in both parents and maternal grandmother and maternal uncle.  Mom has borderline diabetes.  Maternal grandmother and maternal uncle both have diabetes.  Father with nephrolithiasis.  Paternal grandmother with stomach CA.  Maternal grandfather with arrhythmia, renal cell CA.  Maternal grandmother, maternal grandfather with leukemia.  MIs in maternal uncles.  Paternal grandfather had diabetes and lung CA.  REVIEW OF SYSTEMS:  Does complain of some headache since falling and hitting his head.  No visual changes.  No sore throat or fever.  Minimal cough.  Some baseline intermittent abdominal pain and nausea, diarrhea about b.i.d., but no melena or hematochezia.  No edema.  Chest pain as above and dizziness.  Has had some low back pain and dysuria over the past month since passing stones.  PHYSICAL EXAMINATION  VITAL SIGNS:  Temperature 98.5, blood pressure 132/72, pulse 60, although does brady into the 40s occasionally in the ER, respirations 22.  GENERAL:  NAD.  Slightly pale.  SKIN:  Two parallel abrasions around 4 cm in his mid forehead that are horizontal.  Slight  brow laceration.  Well healed abdominal scarring from previous surgeries.  HEENT:  TMs are clear.  EOMI.  PERRL.  Fundi without lesions.  No papilledema. Oropharynx is clear.  Moist mucosa.  Good dentition.  NECK:  Supple.  No ______ or adenopathy, JVD, or bruits.  LUNGS:  Clear to auscultation.  No wheezes or crackles.  BACK:  Positive CVAT bilaterally, although right greater than left.  CARDIOVASCULAR:  RRR.  S1, S2.  No MHR.  ABDOMEN:  Positive bowel sounds.  He does have some tenderness to the right upper quadrant, slight in the mid right abdomen.  No HSM or masses.  GENITOURINARY:   NEMG.  Descended testicles.  Circumcised.  No masses.  EXTREMITIES:  No CCE.  Pulses 1+.  NEUROLOGIC:  Oriented to person and place, year, thinks month is February, does not know the date.  Day is Wednesday.  Cranial nerves 2-12 intact.  Motor 5/5.  Knee DTRs 1+, trace biceps DTRs.  ______ touch intact.  LABORATORIES:  EKG shows sinus brady with a rate of 55.  Head CT has a little bit of frontal soft tissue swelling.  No fractures.  C spine:  Normal.  Laboratories pending.  ASSESSMENT AND PLAN:  Recurrent syncopal episodes.  Patient to be admitted for further evaluation, placed on telemetry.  As far as etiologies, feel this is leaning towards organic as opposed to vasovagal with recurrence of the events and the dizziness intermittently throughout the day.  Rule out cardiac cause. May need cardiology and neurology consults.  Check orthostatic blood pressure and pulse.  Patient up with assist t.i.d. while being monitored. DD:  09/17/00 TD:  09/17/00 Job: [id number]

## 2011-01-22 ENCOUNTER — Emergency Department (HOSPITAL_COMMUNITY)
Admission: EM | Admit: 2011-01-22 | Discharge: 2011-01-22 | Discharge status: Against Medical Advice | Payer: Self-pay | Attending: Emergency Medicine | Admitting: Emergency Medicine

## 2011-01-22 DIAGNOSIS — R109 Unspecified abdominal pain: Secondary | ICD-10-CM | POA: Insufficient documentation

## 2011-01-22 LAB — URINALYSIS, ROUTINE W REFLEX MICROSCOPIC
Bilirubin Urine: NEGATIVE
Glucose, UA: NEGATIVE mg/dL
Hgb urine dipstick: NEGATIVE
Ketones, ur: NEGATIVE mg/dL
Protein, ur: NEGATIVE mg/dL
Urobilinogen, UA: 0.2 mg/dL (ref 0.0–1.0)

## 2011-01-22 LAB — CBC
MCH: 26.4 pg (ref 26.0–34.0)
MCHC: 31.5 g/dL (ref 30.0–36.0)
MCV: 83.8 fL (ref 78.0–100.0)
Platelets: 220 10*3/uL (ref 150–400)
RBC: 5.3 MIL/uL (ref 4.22–5.81)
RDW: 12.9 % (ref 11.5–15.5)

## 2011-01-22 LAB — BASIC METABOLIC PANEL
CO2: 24 mEq/L (ref 19–32)
Calcium: 9.7 mg/dL (ref 8.4–10.5)
Creatinine, Ser: 1.01 mg/dL (ref 0.50–1.35)
GFR calc non Af Amer: >60 mL/min (ref >60)
Sodium: 141 mEq/L (ref 135–145)

## 2011-01-22 LAB — DIFFERENTIAL
Basophils Relative: 0 % (ref 0–1)
Eosinophils Absolute: 0.2 10*3/uL (ref 0.0–0.7)
Eosinophils Relative: 3 % (ref 0–5)
Lymphs Abs: 3 10*3/uL (ref 0.7–4.0)
Monocytes Absolute: 0.7 10*3/uL (ref 0.1–1.0)
Monocytes Relative: 8 % (ref 3–12)

## 2011-03-18 LAB — CBC
HCT: 49.7
MCV: 84.1
Platelets: 212
RDW: 11.8

## 2011-03-18 LAB — DIFFERENTIAL
Lymphocytes Relative: 24
Lymphs Abs: 1.7
Monocytes Absolute: 0.6
Monocytes Relative: 9
Neutro Abs: 4.4

## 2011-03-18 LAB — URINALYSIS, ROUTINE W REFLEX MICROSCOPIC
Glucose, UA: NEGATIVE
Hgb urine dipstick: NEGATIVE
Ketones, ur: NEGATIVE
Protein, ur: NEGATIVE
Urobilinogen, UA: 0.2

## 2011-03-18 LAB — COMPREHENSIVE METABOLIC PANEL
Albumin: 4.3
BUN: 13
Creatinine, Ser: 1.01
GFR calc Af Amer: >60
Total Protein: 7

## 2011-03-20 LAB — BASIC METABOLIC PANEL
CO2: 26
Calcium: 9.5
Chloride: 102
GFR calc Af Amer: >60
Potassium: 4
Sodium: 139

## 2011-03-20 LAB — DIFFERENTIAL
Eosinophils Absolute: 0.2
Lymphs Abs: 1.9
Monocytes Relative: 8
Neutrophils Relative %: 76

## 2011-03-20 LAB — POCT TOXICOLOGY PANEL: Opiates: POSITIVE

## 2011-03-20 LAB — CBC
Hemoglobin: 14.8
MCHC: 34.4
MCV: 83.7
RBC: 5.13

## 2011-03-21 LAB — POCT I-STAT, CHEM 8
Calcium, Ion: 1.16
Creatinine, Ser: 1.1
Hemoglobin: 13.9
Sodium: 141
TCO2: 23

## 2011-03-21 LAB — COMPREHENSIVE METABOLIC PANEL
ALT: 17
AST: 14
CO2: 24
Chloride: 107
GFR calc Af Amer: >60
GFR calc non Af Amer: >60
Potassium: 3.9
Sodium: 139
Total Bilirubin: 0.6

## 2011-03-21 LAB — RAPID URINE DRUG SCREEN, HOSP PERFORMED
Amphetamines: POSITIVE — AB
Amphetamines: NOT DETECTED
Barbiturates: NOT DETECTED
Benzodiazepines: NOT DETECTED
Opiates: POSITIVE — AB
Tetrahydrocannabinol: NOT DETECTED
Tetrahydrocannabinol: NOT DETECTED

## 2011-03-21 LAB — POCT CARDIAC MARKERS
CKMB, poc: <1 — ABNORMAL LOW
Myoglobin, poc: 57.6

## 2011-03-21 LAB — CBC
RBC: 4.52
WBC: 6

## 2011-03-21 LAB — DIFFERENTIAL
Basophils Absolute: 0
Eosinophils Absolute: 0.1
Eosinophils Relative: 1

## 2011-03-21 LAB — ETHANOL: Alcohol, Ethyl (B): <5

## 2011-08-06 ENCOUNTER — Emergency Department (HOSPITAL_BASED_OUTPATIENT_CLINIC_OR_DEPARTMENT_OTHER)
Admission: EM | Admit: 2011-08-06 | Discharge: 2011-08-06 | Disposition: A | Discharge status: Home / Self Care | Payer: 59 | Attending: Emergency Medicine | Admitting: Emergency Medicine

## 2011-08-06 ENCOUNTER — Encounter (HOSPITAL_BASED_OUTPATIENT_CLINIC_OR_DEPARTMENT_OTHER): Payer: Self-pay | Admitting: *Deleted

## 2011-08-06 ENCOUNTER — Emergency Department (INDEPENDENT_AMBULATORY_CARE_PROVIDER_SITE_OTHER): Payer: 59

## 2011-08-06 DIAGNOSIS — Z9049 Acquired absence of other specified parts of digestive tract: Secondary | ICD-10-CM

## 2011-08-06 DIAGNOSIS — F17200 Nicotine dependence, unspecified, uncomplicated: Secondary | ICD-10-CM | POA: Insufficient documentation

## 2011-08-06 DIAGNOSIS — R109 Unspecified abdominal pain: Secondary | ICD-10-CM | POA: Insufficient documentation

## 2011-08-06 DIAGNOSIS — K5090 Crohn's disease, unspecified, without complications: Secondary | ICD-10-CM

## 2011-08-06 DIAGNOSIS — R229 Localized swelling, mass and lump, unspecified: Secondary | ICD-10-CM

## 2011-08-06 HISTORY — DX: Crohn's disease, unspecified, without complications: K50.90

## 2011-08-06 LAB — URINE MICROSCOPIC-ADD ON

## 2011-08-06 LAB — COMPREHENSIVE METABOLIC PANEL
ALT: 24 U/L (ref 0–53)
AST: 18 U/L (ref 0–37)
Albumin: 4.6 g/dL (ref 3.5–5.2)
Alkaline Phosphatase: 102 U/L (ref 39–117)
BUN: 13 mg/dL (ref 6–23)
Chloride: 103 mEq/L (ref 96–112)
Potassium: 3.4 mEq/L — ABNORMAL LOW (ref 3.5–5.1)
Sodium: 139 mEq/L (ref 135–145)
Total Bilirubin: 0.3 mg/dL (ref 0.3–1.2)
Total Protein: 7.4 g/dL (ref 6.0–8.3)

## 2011-08-06 LAB — CBC
Hemoglobin: 16.1 g/dL (ref 13.0–17.0)
MCHC: 36.2 g/dL — ABNORMAL HIGH (ref 30.0–36.0)
Platelets: 288 10*3/uL (ref 150–400)

## 2011-08-06 LAB — URINALYSIS, ROUTINE W REFLEX MICROSCOPIC
Bilirubin Urine: NEGATIVE
Glucose, UA: NEGATIVE mg/dL
Ketones, ur: 15 mg/dL — AB
Nitrite: NEGATIVE
Specific Gravity, Urine: 1.043 — ABNORMAL HIGH (ref 1.005–1.030)
pH: 6 (ref 5.0–8.0)

## 2011-08-06 LAB — DIFFERENTIAL
Basophils Absolute: 0 10*3/uL (ref 0.0–0.1)
Basophils Relative: 0 % (ref 0–1)
Eosinophils Absolute: 0.1 10*3/uL (ref 0.0–0.7)
Monocytes Relative: 7 % (ref 3–12)
Neutro Abs: 8.1 10*3/uL — ABNORMAL HIGH (ref 1.7–7.7)
Neutrophils Relative %: 66 % (ref 43–77)

## 2011-08-06 MED ORDER — IOHEXOL 300 MG/ML  SOLN
100.0000 mL | Freq: Once | INTRAMUSCULAR | Status: AC | PRN
  Administered 2011-08-06: 100 mL via INTRAVENOUS

## 2011-08-06 MED ORDER — HYDROMORPHONE HCL PF 1 MG/ML IJ SOLN
1.0000 mg | Freq: Once | INTRAMUSCULAR | Status: AC
  Administered 2011-08-06: 1 mg via INTRAVENOUS
  Filled 2011-08-06: qty 1

## 2011-08-06 MED ORDER — IOHEXOL 300 MG/ML  SOLN
20.0000 mL | Freq: Once | INTRAMUSCULAR | Status: AC | PRN
  Administered 2011-08-06: 20 mL via ORAL

## 2011-08-06 MED ORDER — METRONIDAZOLE 500 MG PO TABS: 500.0000 mg | ORAL_TABLET | Freq: Two times a day (BID) | ORAL | Status: AC

## 2011-08-06 MED ORDER — CIPROFLOXACIN HCL 500 MG PO TABS: 500.0000 mg | ORAL_TABLET | Freq: Two times a day (BID) | ORAL | Status: AC

## 2011-08-06 MED ORDER — ONDANSETRON HCL 4 MG/2ML IJ SOLN
4.0000 mg | Freq: Once | INTRAMUSCULAR | Status: AC
  Administered 2011-08-06: 4 mg via INTRAVENOUS
  Filled 2011-08-06: qty 2

## 2011-08-06 MED ORDER — PREDNISONE 10 MG PO TABS: 20.0000 mg | ORAL_TABLET | Freq: Every day | ORAL | Status: DC

## 2011-08-06 MED ORDER — CIPROFLOXACIN HCL 500 MG PO TABS
500.0000 mg | ORAL_TABLET | Freq: Once | ORAL | Status: AC
  Administered 2011-08-06: 500 mg via ORAL
  Filled 2011-08-06: qty 1

## 2011-08-06 MED ORDER — METRONIDAZOLE 500 MG PO TABS
500.0000 mg | ORAL_TABLET | Freq: Once | ORAL | Status: AC
  Administered 2011-08-06: 500 mg via ORAL
  Filled 2011-08-06: qty 1

## 2011-08-06 MED ORDER — METHYLPREDNISOLONE SODIUM SUCC 125 MG IJ SOLR
125.0000 mg | Freq: Once | INTRAMUSCULAR | Status: AC
  Administered 2011-08-06: 125 mg via INTRAVENOUS
  Filled 2011-08-06: qty 2

## 2011-08-06 MED ORDER — OXYCODONE-ACETAMINOPHEN 5-325 MG PO TABS
1.0000 | ORAL_TABLET | Freq: Once | ORAL | Status: AC
  Administered 2011-08-06: 1 via ORAL
  Filled 2011-08-06: qty 1

## 2011-08-06 MED ORDER — OXYCODONE-ACETAMINOPHEN 5-325 MG PO TABS: 2.0000 | ORAL_TABLET | ORAL | Status: AC | PRN

## 2011-08-06 MED ORDER — SODIUM CHLORIDE 0.9 % IV BOLUS (SEPSIS)
1000.0000 mL | Freq: Once | INTRAVENOUS | Status: AC
  Administered 2011-08-06: 1000 mL via INTRAVENOUS

## 2011-08-06 NOTE — ED Notes (Signed)
Pt being transported to CT at this time.

## 2011-08-06 NOTE — ED Notes (Signed)
For the past three nights has had "belly pain". No appetite and not sleeping well. Denies N/V

## 2011-08-06 NOTE — Discharge Instructions (Signed)
Please take copies of your labs and ct and recheck with your doctor tomorrow.   Crohn's Disease Crohn's disease is a long-term (chronic) soreness and redness (inflammation) of the intestines (bowel). It can affect any portion of the digestive tract, from the mouth to the anus. It can also cause problems outside the digestive tract. Crohn's disease is closely related to a disease called ulcerative colitis (together, these two diseases are called inflammatory bowel disease).  CAUSES  The cause of Crohn's disease is not known. One Link Snuffer is that, in an easily affected (susceptible) person, the immune system is triggered to attack the body's own digestive tissue. Crohn's disease runs in families. It seems to be more common in certain geographic areas and amongst certain races. There are no clear-cut dietary causes.  SYMPTOMS  Crohn's disease can cause many different symptoms since it can affect many different parts of the body. Symptoms include:  Fatigue.   Weight loss.   Chronic diarrhea, sometime bloody.   Abdominal pain and cramps.   Fever.   Ulcers or canker sores in the mouth or rectum.   Anemia (low red blood cells).   Arthritis, skin problems, and eye problems may occur.  Complications of Crohn's disease can include:  Series of holes (perforation) of the bowel.   Portions of the intestines sticking to each other (adhesions).   Obstruction of the bowel.   Fistula formation, typically in the rectal area but also in other areas. A fistula is an opening between the bowels and the outside, or between the bowels and another organ.   A painful crack in the mucous membrane of the anus (rectal fissure).  DIAGNOSIS  Your caregiver may suspect Crohn's disease based on your symptoms and an exam. Blood tests may confirm that there is a problem. You may be asked to submit a stool specimen for examination. X-rays and CT scans may be necessary. Ultimately, the diagnosis is usually made after  a procedure that uses a flexible tube that is inserted via your mouth or your anus. This is done under sedation and is called either an upper endoscopy or colonoscopy. With these tests, the specialist can take tiny tissue samples and remove them from the inside of the bowel (biopsy). Examination of this biopsy tissue under a microscope can reveal Crohn's disease as the cause of your symptoms. Due to the many different forms that Crohn's disease can take, symptoms may be present for several years before a diagnosis is made. HOME CARE INSTRUCTIONS   There is no cure for Crohn's disease. The best treatment is frequent checkups with your caregiver.   Symptoms such as diarrhea can be controlled with medications. Avoid foods that have a laxative effect such as fresh fruit, vegetables and dairy products. During flare ups, you can rest your bowel by refraining from solid foods. Drink clear liquids frequently during the day (electrolyte or re-hydrating fluids are best. Your caregiver can help you with suggestions). Drink often to prevent loss of body fluids (dehydration). When diarrhea has cleared, eat small meals and more frequently. Avoid food additives and stimulants such as caffeine (coffee, tea, or chocolate). Enzyme supplements may help if you develop intolerance to a sugar in dairy products (lactose). Ask your caregiver or dietitian about specific dietary instructions.   Try to maintain a positive attitude. Learn relaxation techniques such as self hypnosis, mental imaging, and muscle relaxation.   If possible, avoid stresses which can aggravate your condition.   Exercise regularly.   Follow your diet.  Always get plenty of rest.  SEEK MEDICAL CARE IF:   Your symptoms fail to improve after a week or two of new treatment.   You experience continued weight loss.   You have ongoing crampy digestion or loose bowels.   You develop a new skin rash, skin sores, or eye problems.  SEEK IMMEDIATE  MEDICAL CARE IF:   You have worsening of your symptoms or develop new symptoms.   You have a fever.   You develop bloody diarrhea.   You develop severe abdominal pain.  MAKE SURE YOU:   Understand these instructions.   Will watch your condition.   Will get help right away if you are not doing well or get worse.  Document Released: 03/19/2005 Document Revised: 02/19/2011 Document Reviewed: 02/15/2007 [facility] Patient Information 2012 [place].

## 2011-08-06 NOTE — ED Provider Notes (Signed)
History     CSN: [id number]  Arrival date & time 08/06/11  3825   First MD Initiated Contact with Patient 08/06/11 2016      Chief Complaint  Patient presents with  . Abdominal Pain    (Consider location/radiation/quality/duration/timing/severity/associated sxs/prior treatment) HPI Patient with history of Crohn's disease s/p bowel resection 2001.  Patient states he has not had Crohn's problems for several years.  He has has had pain for 2-3 days across upper abdomen with no increasing or decreasing factors.  Patient did not eat today due to increased pain.  Patient had watery diarrhea yesterday no signs of blood, today loose but less frequent.  PMD-Dr. Chaya Jan, [facility].   Past Medical History  Diagnosis Date  . Renal disorder   . Crohn's disease     Past Surgical History  Procedure Date  . Bowel resection   . Cholecystectomy   . Tonsillectomy   . Appendectomy     No family history on file.  History  Substance Use Topics  . Smoking status: Current Everyday Smoker  . Smokeless tobacco: Not on file  . Alcohol Use: Yes      Review of Systems  Constitutional: Positive for chills and appetite change. Negative for fever.  All other systems reviewed and are negative.    Allergies  Prednisone; Morphine and related; and Vicodin  Home Medications   Current Outpatient Rx  Name Route Sig Dispense Refill  . IBUPROFEN 200 MG PO TABS Oral Take 800 mg by mouth every 6 (six) hours as needed. For pain    . LISINOPRIL 10 MG PO TABS Oral Take 10 mg by mouth daily.    Marland Kitchen OMEPRAZOLE MAGNESIUM 20 MG PO TBEC Oral Take 40 mg by mouth daily.    Marland Kitchen VITAMIN D (ERGOCALCIFEROL) 50000 UNITS PO CAPS Oral Take 50,000 Units by mouth every 7 (seven) days. Take on Sunday      BP 145/102  Pulse 99  Temp(Src) 98.4 F (36.9 C) (Oral)  Resp 20  SpO2 100%  Physical Exam  Nursing note and vitals reviewed. Constitutional: He is oriented to person, place, and time. He appears  well-developed and well-nourished.  HENT:  Head: Normocephalic and atraumatic.  Eyes: Conjunctivae and EOM are normal. Pupils are equal, round, and reactive to light.  Neck: Normal range of motion. Neck supple.  Cardiovascular: Normal rate, regular rhythm, normal heart sounds and intact distal pulses.   Pulmonary/Chest: Effort normal and breath sounds normal.  Abdominal: Soft. Bowel sounds are normal. There is tenderness.       Mild diffuse tenderness  Musculoskeletal: Normal range of motion.  Neurological: He is alert and oriented to person, place, and time. He has normal reflexes.  Skin: Skin is warm and dry.  Psychiatric: He has a normal mood and affect.    ED Course  Procedures (including critical care time)  Labs Reviewed - No data to display No results found.   No diagnosis found.   Results for orders placed during the hospital encounter of 08/06/11  CBC      Component Value Range   WBC 12.4 (*) 4.0 - 10.5 (K/uL)   RBC 5.44  4.22 - 5.81 (MIL/uL)   Hemoglobin 16.1  13.0 - 17.0 (g/dL)   HCT 44.5  39.0 - 52.0 (%)   MCV 81.8  78.0 - 100.0 (fL)   MCH 29.6  26.0 - 34.0 (pg)   MCHC 36.2 (*) 30.0 - 36.0 (g/dL)   RDW 12.5  11.5 - 15.5 (%)   Platelets 288  150 - 400 (K/uL)  DIFFERENTIAL      Component Value Range   Neutrophils Relative 66  43 - 77 (%)   Neutro Abs 8.1 (*) 1.7 - 7.7 (K/uL)   Lymphocytes Relative 26  12 - 46 (%)   Lymphs Abs 3.2  0.7 - 4.0 (K/uL)   Monocytes Relative 7  3 - 12 (%)   Monocytes Absolute 0.9  0.1 - 1.0 (K/uL)   Eosinophils Relative 1  0 - 5 (%)   Eosinophils Absolute 0.1  0.0 - 0.7 (K/uL)   Basophils Relative 0  0 - 1 (%)   Basophils Absolute 0.0  0.0 - 0.1 (K/uL)  COMPREHENSIVE METABOLIC PANEL      Component Value Range   Sodium 139  135 - 145 (mEq/L)   Potassium 3.4 (*) 3.5 - 5.1 (mEq/L)   Chloride 103  96 - 112 (mEq/L)   CO2 24  19 - 32 (mEq/L)   Glucose, Bld 99  70 - 99 (mg/dL)   BUN 13  6 - 23 (mg/dL)   Creatinine, Ser 1.20  0.50  - 1.35 (mg/dL)   Calcium 10.4  8.4 - 10.5 (mg/dL)   Total Protein 7.4  6.0 - 8.3 (g/dL)   Albumin 4.6  3.5 - 5.2 (g/dL)   AST 18  0 - 37 (U/L)   ALT 24  0 - 53 (U/L)   Alkaline Phosphatase 102  39 - 117 (U/L)   Total Bilirubin 0.3  0.3 - 1.2 (mg/dL)   GFR calc non Af Amer 79 (*) >90 (mL/min)   GFR calc Af Amer >90  >90 (mL/min)  LIPASE, BLOOD      Component Value Range   Lipase 35  11 - 59 (U/L)  URINALYSIS, ROUTINE W REFLEX MICROSCOPIC      Component Value Range   Color, Urine AMBER (*) YELLOW    APPearance CLEAR  CLEAR    Specific Gravity, Urine 1.043 (*) 1.005 - 1.030    pH 6.0  5.0 - 8.0    Glucose, UA NEGATIVE  NEGATIVE (mg/dL)   Hgb urine dipstick NEGATIVE  NEGATIVE    Bilirubin Urine NEGATIVE  NEGATIVE    Ketones, ur 15 (*) NEGATIVE (mg/dL)   Protein, ur 30 (*) NEGATIVE (mg/dL)   Urobilinogen, UA 0.2  0.0 - 1.0 (mg/dL)   Nitrite NEGATIVE  NEGATIVE    Leukocytes, UA NEGATIVE  NEGATIVE   URINE MICROSCOPIC-ADD ON      Component Value Range   Squamous Epithelial / LPF FEW (*) RARE    WBC, UA 0-2  <3 (WBC/hpf)   RBC / HPF 0-2  <3 (RBC/hpf)   Bacteria, UA MANY (*) RARE    Urine-Other MUCOUS PRESENT      MDM  Patient feels better after pain meds.  No fever or vomiting.   Patient had been on prednisone multiple times and was on chronic treatment when he had throat tightness one time and prednisone since listed as allergy.  Solumedrol here with no problems.  Advised regarding need to seek medical treatment if any symptoms of allergic reaction.         Shaune Pollack, MD 08/06/11 2209

## 2011-09-24 ENCOUNTER — Emergency Department (HOSPITAL_BASED_OUTPATIENT_CLINIC_OR_DEPARTMENT_OTHER)
Admission: EM | Admit: 2011-09-24 | Discharge: 2011-09-24 | Disposition: A | Discharge status: Home / Self Care | Payer: 59 | Attending: Emergency Medicine | Admitting: Emergency Medicine

## 2011-09-24 ENCOUNTER — Emergency Department (INDEPENDENT_AMBULATORY_CARE_PROVIDER_SITE_OTHER): Payer: 59

## 2011-09-24 ENCOUNTER — Encounter (HOSPITAL_BASED_OUTPATIENT_CLINIC_OR_DEPARTMENT_OTHER): Payer: Self-pay | Admitting: *Deleted

## 2011-09-24 DIAGNOSIS — R05 Cough: Secondary | ICD-10-CM

## 2011-09-24 DIAGNOSIS — R509 Fever, unspecified: Secondary | ICD-10-CM

## 2011-09-24 DIAGNOSIS — Z79899 Other long term (current) drug therapy: Secondary | ICD-10-CM | POA: Insufficient documentation

## 2011-09-24 DIAGNOSIS — R059 Cough, unspecified: Secondary | ICD-10-CM

## 2011-09-24 DIAGNOSIS — J3489 Other specified disorders of nose and nasal sinuses: Secondary | ICD-10-CM | POA: Insufficient documentation

## 2011-09-24 DIAGNOSIS — K5090 Crohn's disease, unspecified, without complications: Secondary | ICD-10-CM | POA: Insufficient documentation

## 2011-09-24 DIAGNOSIS — IMO0001 Reserved for inherently not codable concepts without codable children: Secondary | ICD-10-CM | POA: Insufficient documentation

## 2011-09-24 DIAGNOSIS — J069 Acute upper respiratory infection, unspecified: Secondary | ICD-10-CM

## 2011-09-24 DIAGNOSIS — F17200 Nicotine dependence, unspecified, uncomplicated: Secondary | ICD-10-CM | POA: Insufficient documentation

## 2011-09-24 DIAGNOSIS — I10 Essential (primary) hypertension: Secondary | ICD-10-CM | POA: Insufficient documentation

## 2011-09-24 HISTORY — DX: Essential (primary) hypertension: I10

## 2011-09-24 MED ORDER — ALBUTEROL SULFATE HFA 108 (90 BASE) MCG/ACT IN AERS
2.0000 | INHALATION_SPRAY | Freq: Once | RESPIRATORY_TRACT | Status: AC
  Administered 2011-09-24: 2 via RESPIRATORY_TRACT
  Filled 2011-09-24: qty 6.7

## 2011-09-24 MED ORDER — ALBUTEROL SULFATE (5 MG/ML) 0.5% IN NEBU
5.0000 mg | INHALATION_SOLUTION | Freq: Once | RESPIRATORY_TRACT | Status: AC
  Administered 2011-09-24: 5 mg via RESPIRATORY_TRACT
  Filled 2011-09-24: qty 1

## 2011-09-24 MED ORDER — IPRATROPIUM BROMIDE 0.02 % IN SOLN
0.5000 mg | Freq: Once | RESPIRATORY_TRACT | Status: AC
  Administered 2011-09-24: 0.5 mg via RESPIRATORY_TRACT
  Filled 2011-09-24: qty 2.5

## 2011-09-24 MED ORDER — METHYLPREDNISOLONE 4 MG PO KIT: PACK | ORAL | Status: DC

## 2011-09-24 NOTE — ED Notes (Signed)
Pt c/o cold  Symptoms x 5 days

## 2011-09-24 NOTE — ED Provider Notes (Signed)
Medical screening examination/treatment/procedure(s) were performed by non-physician practitioner and as supervising physician I was immediately available for consultation/collaboration.   Sharyon Cable, MD 09/24/11 [phone]

## 2011-09-24 NOTE — Discharge Instructions (Signed)

## 2011-09-24 NOTE — ED Provider Notes (Signed)
History     CSN: [id number]  Arrival date & time 09/24/11  1801   First MD Initiated Contact with Patient 09/24/11 1804     6:24 PM HPI Patient reports upper respiratory tract infection for 5 days. States he has had intermittent fevers better controlled with Tylenol and ibuprofen. Reports cough is most significant finding. Describes cough is nonproductive stemming from his chest. Also has associated symptoms of rhinorrhea, nasal congestion, body aches. Patient is a 31 y.o. male presenting with URI. The history is provided by the patient.  URI The primary symptoms include fever, cough and myalgias. Primary symptoms do not include headaches, ear pain, sore throat, wheezing, abdominal pain, nausea or vomiting. The current episode started 3 to 5 days ago. This is a new problem. The problem has not changed since onset. The cough began 3 to 5 days ago. The cough is new. The cough is non-productive.  Symptoms associated with the illness include chills, congestion and rhinorrhea. The illness is not associated with sinus pressure. The following treatments were addressed: A decongestant was ineffective.    Past Medical History  Diagnosis Date  . Renal disorder   . Crohn's disease   . Hypertension     Past Surgical History  Procedure Date  . Bowel resection   . Cholecystectomy   . Tonsillectomy   . Appendectomy     History reviewed. No pertinent family history.  History  Substance Use Topics  . Smoking status: Current Everyday Smoker -- 1.0 packs/day  . Smokeless tobacco: Not on file  . Alcohol Use: Yes      Review of Systems  Constitutional: Positive for fever and chills.  HENT: Positive for congestion and rhinorrhea. Negative for ear pain, sore throat and sinus pressure.   Eyes: Negative for photophobia and pain.  Respiratory: Positive for cough. Negative for wheezing.   Gastrointestinal: Negative for nausea, vomiting and abdominal pain.  Musculoskeletal: Positive for myalgias.   Neurological: Negative for headaches.  All other systems reviewed and are negative.    Allergies  Prednisone; Morphine and related; and Vicodin  Home Medications   Current Outpatient Rx  Name Route Sig Dispense Refill  . IBUPROFEN 200 MG PO TABS Oral Take 800 mg by mouth every 6 (six) hours as needed. For pain    . LISINOPRIL 10 MG PO TABS Oral Take 10 mg by mouth daily.    Marland Kitchen OMEPRAZOLE MAGNESIUM 20 MG PO TBEC Oral Take 40 mg by mouth daily.    Marland Kitchen PREDNISONE 10 MG PO TABS Oral Take 2 tablets (20 mg total) by mouth daily. 15 tablet 0  . VITAMIN D (ERGOCALCIFEROL) 50000 UNITS PO CAPS Oral Take 50,000 Units by mouth every 7 (seven) days. Take on Sunday      BP 158/91  Pulse 100  Temp 98.9 F (37.2 C)  Resp 18  Ht 6' (1.829 m)  Wt 185 lb (83.915 kg)  BMI 25.09 kg/m2  SpO2 100%  Physical Exam  Vitals reviewed. Constitutional: He is oriented to person, place, and time. He appears well-developed and well-nourished.  HENT:  Head: Normocephalic and atraumatic.  Right Ear: External ear normal.  Left Ear: External ear normal.  Nose: Nose normal.  Mouth/Throat: Oropharynx is clear and moist. No oropharyngeal exudate.  Eyes: Conjunctivae are normal. Pupils are equal, round, and reactive to light.  Neck: Normal range of motion. Neck supple.  Cardiovascular: Normal rate and regular rhythm.  Exam reveals no gallop and no friction rub.   No murmur  heard. Pulmonary/Chest: Effort normal and breath sounds normal. He has no wheezes. He has no rales. He exhibits no tenderness.  Abdominal: Soft. Bowel sounds are normal.  Lymphadenopathy:    He has no cervical adenopathy.  Neurological: He is alert and oriented to person, place, and time.  Skin: Skin is warm and dry. No rash noted. No erythema. No pallor.  Psychiatric: He has a normal mood and affect. His behavior is normal.    ED Course  Procedures  Dg Chest 2 View  09/24/2011  *RADIOLOGY REPORT*  Clinical Data: Cough, fever  CHEST - 2  VIEW  Comparison: 01/25/2008  Findings: Cardiomediastinal silhouette is stable.  No acute infiltrate or pleural effusion.  No pulmonary edema. Mild infrahilar increased bronchial markings without focal consolidation.  IMPRESSION: No acute infiltrate or pulmonary edema.  Mild infrahilar increased bronchial markings without focal consolidation.  Original Report Authenticated By: Lahoma Crocker, M.D.     MDM    7:10 PM Patient does not have pneumonia on chest x-ray. We'll treat upper respiratory tract infection with albuterol inhaler and methylprednisolone. Patient states he is allergic to prednisone but has been able to tolerate methylprednisolone. Advised return to emergency department for worsening symptoms. Patient agrees to plan and is ready for discharge   Sheliah Mends, Hershal Coria 09/24/11 1913

## 2012-01-01 ENCOUNTER — Encounter (HOSPITAL_BASED_OUTPATIENT_CLINIC_OR_DEPARTMENT_OTHER): Payer: Self-pay

## 2012-01-01 ENCOUNTER — Emergency Department (HOSPITAL_BASED_OUTPATIENT_CLINIC_OR_DEPARTMENT_OTHER): Payer: 59

## 2012-01-01 ENCOUNTER — Emergency Department (HOSPITAL_BASED_OUTPATIENT_CLINIC_OR_DEPARTMENT_OTHER)
Admission: EM | Admit: 2012-01-01 | Discharge: 2012-01-02 | Disposition: A | Discharge status: Home / Self Care | Payer: 59 | Attending: Emergency Medicine | Admitting: Emergency Medicine

## 2012-01-01 DIAGNOSIS — K5090 Crohn's disease, unspecified, without complications: Secondary | ICD-10-CM | POA: Insufficient documentation

## 2012-01-01 DIAGNOSIS — R197 Diarrhea, unspecified: Secondary | ICD-10-CM | POA: Insufficient documentation

## 2012-01-01 DIAGNOSIS — F17200 Nicotine dependence, unspecified, uncomplicated: Secondary | ICD-10-CM | POA: Insufficient documentation

## 2012-01-01 DIAGNOSIS — I10 Essential (primary) hypertension: Secondary | ICD-10-CM | POA: Insufficient documentation

## 2012-01-01 DIAGNOSIS — R112 Nausea with vomiting, unspecified: Secondary | ICD-10-CM | POA: Insufficient documentation

## 2012-01-01 DIAGNOSIS — R1013 Epigastric pain: Secondary | ICD-10-CM | POA: Insufficient documentation

## 2012-01-01 DIAGNOSIS — R109 Unspecified abdominal pain: Secondary | ICD-10-CM

## 2012-01-01 LAB — COMPREHENSIVE METABOLIC PANEL
AST: 19 U/L (ref 0–37)
BUN: 10 mg/dL (ref 6–23)
CO2: 25 mEq/L (ref 19–32)
Calcium: 9.6 mg/dL (ref 8.4–10.5)
Chloride: 103 mEq/L (ref 96–112)
Creatinine, Ser: 1.1 mg/dL (ref 0.50–1.35)
GFR calc Af Amer: >90 mL/min (ref >90)
GFR calc non Af Amer: 87 mL/min — ABNORMAL LOW (ref >90)
Glucose, Bld: 132 mg/dL — ABNORMAL HIGH (ref 70–99)
Total Bilirubin: 0.5 mg/dL (ref 0.3–1.2)

## 2012-01-01 LAB — CBC WITH DIFFERENTIAL/PLATELET
Basophils Absolute: 0.1 10*3/uL (ref 0.0–0.1)
Eosinophils Relative: 2 % (ref 0–5)
HCT: 40.6 % (ref 39.0–52.0)
Hemoglobin: 14.5 g/dL (ref 13.0–17.0)
Lymphocytes Relative: 25 % (ref 12–46)
Lymphs Abs: 2.8 10*3/uL (ref 0.7–4.0)
MCV: 83.7 fL (ref 78.0–100.0)
Monocytes Absolute: 0.7 10*3/uL (ref 0.1–1.0)
Monocytes Relative: 6 % (ref 3–12)
Neutro Abs: 7.3 10*3/uL (ref 1.7–7.7)
RBC: 4.85 MIL/uL (ref 4.22–5.81)
WBC: 11.1 10*3/uL — ABNORMAL HIGH (ref 4.0–10.5)

## 2012-01-01 LAB — URINALYSIS, ROUTINE W REFLEX MICROSCOPIC
Glucose, UA: NEGATIVE mg/dL
Hgb urine dipstick: NEGATIVE
Leukocytes, UA: NEGATIVE
Protein, ur: NEGATIVE mg/dL
pH: 6 (ref 5.0–8.0)

## 2012-01-01 MED ORDER — SODIUM CHLORIDE 0.9 % IV BOLUS (SEPSIS)
250.0000 mL | Freq: Once | INTRAVENOUS | Status: AC
  Administered 2012-01-01: 23:00:00 via INTRAVENOUS

## 2012-01-01 MED ORDER — HYDROMORPHONE HCL PF 1 MG/ML IJ SOLN
1.0000 mg | Freq: Once | INTRAMUSCULAR | Status: AC
  Administered 2012-01-01: 1 mg via INTRAVENOUS
  Filled 2012-01-01: qty 1

## 2012-01-01 MED ORDER — ONDANSETRON HCL 4 MG/2ML IJ SOLN
4.0000 mg | Freq: Once | INTRAMUSCULAR | Status: AC
  Administered 2012-01-01: 4 mg via INTRAVENOUS
  Filled 2012-01-01: qty 2

## 2012-01-01 MED ORDER — SODIUM CHLORIDE 0.9 % IV SOLN
INTRAVENOUS | Status: DC
  Administered 2012-01-02: via INTRAVENOUS

## 2012-01-01 NOTE — ED Notes (Signed)
Pt wanting to leave. Dr. Rogene Houston made aware and in to evaluate pt now.

## 2012-01-01 NOTE — ED Notes (Signed)
Pt reports abdominal pain and diarrhea x 3 days.

## 2012-01-02 MED ORDER — IOHEXOL 300 MG/ML  SOLN
20.0000 mL | Freq: Once | INTRAMUSCULAR | Status: AC | PRN
  Administered 2012-01-02: 20 mL via ORAL

## 2012-01-02 MED ORDER — OXYCODONE-ACETAMINOPHEN 5-325 MG PO TABS: 1.0000 | ORAL_TABLET | Freq: Four times a day (QID) | ORAL | Status: AC | PRN

## 2012-01-02 MED ORDER — IOHEXOL 300 MG/ML  SOLN
100.0000 mL | Freq: Once | INTRAMUSCULAR | Status: AC | PRN
  Administered 2012-01-02: 100 mL via INTRAVENOUS

## 2012-01-02 NOTE — Discharge Instructions (Signed)
Followup with your regular Dr. in [place] and her GI doctor as needed. Workup tonight without any significant findings no evidence of exacerbation or flare of Crohn's. Continue your current medications.

## 2012-01-02 NOTE — ED Notes (Signed)
Pt returned from CT °

## 2012-01-02 NOTE — ED Provider Notes (Signed)
History     CSN: [id number]  Arrival date & time 01/01/12  4742   First MD Initiated Contact with Patient 01/01/12 2234      Chief Complaint  Patient presents with  . Abdominal Pain  . Diarrhea    (Consider location/radiation/quality/duration/timing/severity/associated sxs/prior treatment) Patient is a 31 y.o. male presenting with abdominal pain and diarrhea. The history is provided by the patient.  Abdominal Pain The primary symptoms of the illness include abdominal pain, nausea, vomiting and diarrhea. The primary symptoms of the illness do not include fever, fatigue, shortness of breath, hematemesis, hematochezia or dysuria. The current episode started more than 2 days ago (Ongoing for 3 days.). The onset of the illness was sudden. The problem has not changed since onset. The abdominal pain is located in the epigastric region. The abdominal pain does not radiate. The severity of the abdominal pain is 10/10. The abdominal pain is relieved by nothing.  Symptoms associated with the illness do not include back pain.  Diarrhea The primary symptoms include abdominal pain, nausea, vomiting and diarrhea. Primary symptoms do not include fever, fatigue, hematemesis, hematochezia, dysuria or rash.  The illness does not include back pain.   patient with a history of Crohn's disease this reminds him of an accessory duration of the upper lobe or severe little different location spelled by GI medicine at [facility] and followed by primary care Dr. in [place]. Pain is mostly epigastric area the diarrhea has been maybe 3-4 stools a day for the past 3 days abdominal pain has gotten worse last night. Associated with vomiting no blood in vomit or diarrhea.  Past Medical History  Diagnosis Date  . Renal disorder   . Crohn's disease   . Hypertension     Past Surgical History  Procedure Date  . Bowel resection   . Cholecystectomy   . Tonsillectomy   . Appendectomy     No family history on  file.  History  Substance Use Topics  . Smoking status: Current Everyday Smoker -- 1.0 packs/day  . Smokeless tobacco: Not on file  . Alcohol Use: Yes     occasional      Review of Systems  Constitutional: Negative for fever and fatigue.  HENT: Negative for neck pain.   Eyes: Negative for redness.  Respiratory: Negative for shortness of breath.   Gastrointestinal: Positive for nausea, vomiting, abdominal pain and diarrhea. Negative for hematochezia and hematemesis.  Genitourinary: Negative for dysuria.  Musculoskeletal: Negative for back pain.  Skin: Negative for rash.  Neurological: Negative for headaches.  Hematological: Does not bruise/bleed easily.    Allergies  Prednisone; Morphine and related; and Vicodin  Home Medications   Current Outpatient Rx  Name Route Sig Dispense Refill  . IBUPROFEN 200 MG PO TABS Oral Take 800 mg by mouth 3 (three) times daily as needed. For pain    . OMEPRAZOLE MAGNESIUM 20 MG PO TBEC Oral Take 40 mg by mouth daily.    . OXYCODONE-ACETAMINOPHEN 5-325 MG PO TABS Oral Take 1-2 tablets by mouth every 6 (six) hours as needed for pain. 15 tablet 0    BP 136/90  Pulse 65  Temp 98.2 F (36.8 C) (Oral)  Resp 16  Ht 6' (1.829 m)  Wt 189 lb (85.73 kg)  BMI 25.63 kg/m2  SpO2 100%  Physical Exam  Nursing note and vitals reviewed. Constitutional: He is oriented to person, place, and time. He appears well-developed and well-nourished. No distress.  HENT:  Head: Normocephalic and atraumatic.  Mouth/Throat: Oropharynx is clear and moist.  Eyes: Conjunctivae and EOM are normal. Pupils are equal, round, and reactive to light.  Neck: Normal range of motion. Neck supple.  Cardiovascular: Normal rate, regular rhythm and normal heart sounds.   No murmur heard. Pulmonary/Chest: Effort normal and breath sounds normal.  Abdominal: Soft. Bowel sounds are normal. There is no tenderness.  Musculoskeletal: Normal range of motion.  Neurological: He is  alert and oriented to person, place, and time. No cranial nerve deficit. He exhibits normal muscle tone. Coordination normal.  Skin: Skin is warm. No rash noted.    ED Course  Procedures (including critical care time)  Labs Reviewed  CBC WITH DIFFERENTIAL - Abnormal; Notable for the following:    WBC 11.1 (*)     All other components within normal limits  COMPREHENSIVE METABOLIC PANEL - Abnormal; Notable for the following:    Potassium 3.4 (*)     Glucose, Bld 132 (*)     GFR calc non Af Amer 87 (*)     All other components within normal limits  URINALYSIS, ROUTINE W REFLEX MICROSCOPIC  LIPASE, BLOOD   Ct Abdomen Pelvis W Contrast  01/02/2012  *RADIOLOGY REPORT*  Clinical Data: Abdominal pain and diarrhea  CT ABDOMEN AND PELVIS WITH CONTRAST  Technique:  Multidetector CT imaging of the abdomen and pelvis was performed following the standard protocol during bolus administration of intravenous contrast.  Contrast: 49m OMNIPAQUE IOHEXOL 300 MG/ML  SOLN, 1039mOMNIPAQUE IOHEXOL 300 MG/ML  SOLN  Comparison: 08/06/2011  Findings: Post cholecystectomy.  Diffuse hepatic steatosis.  Spleen, pancreas, adrenal glands, kidneys are within normal limits.  Circumaortic left renal vein anatomy.  Postoperative changes from the ileocolic anastomosis in the right lower quadrant.  No free fluid.  No abnormal adenopathy.  Bladder and prostate are within normal limits.  Unilateral right L5 pars defect.  IMPRESSION: No acute intra-abdominal pathology.  Original Report Authenticated By: ARJamas LavM.D.     1. Abdominal pain       MDM   The patient with history of Crohn's disease with CT scan and lab workup without any significant findings. We'll have patient followup with primary care Dr. and GI doctor but no evidence of a Crohn exacerbation. Patient feels better now in ED will discharge home       ScMervin KungMD 01/02/12 [phone]

## 2012-03-25 ENCOUNTER — Ambulatory Visit: Payer: 59

## 2012-03-25 ENCOUNTER — Ambulatory Visit (INDEPENDENT_AMBULATORY_CARE_PROVIDER_SITE_OTHER): Payer: 59 | Admitting: Family Medicine

## 2012-03-25 VITALS — BP 144/96 | HR 87 | Temp 98.2°F | Resp 16 | Ht 72.25 in | Wt 177.0 lb

## 2012-03-25 DIAGNOSIS — K029 Dental caries, unspecified: Secondary | ICD-10-CM

## 2012-03-25 DIAGNOSIS — J309 Allergic rhinitis, unspecified: Secondary | ICD-10-CM

## 2012-03-25 DIAGNOSIS — Z716 Tobacco abuse counseling: Secondary | ICD-10-CM

## 2012-03-25 DIAGNOSIS — Z23 Encounter for immunization: Secondary | ICD-10-CM

## 2012-03-25 DIAGNOSIS — Z7189 Other specified counseling: Secondary | ICD-10-CM

## 2012-03-25 DIAGNOSIS — I10 Essential (primary) hypertension: Secondary | ICD-10-CM

## 2012-03-25 LAB — POCT CBC
HCT, POC: 48.2 % (ref 43.5–53.7)
Hemoglobin: 15.7 g/dL (ref 14.1–18.1)
MCH, POC: 29.7 pg (ref 27–31.2)
MPV: 8.4 fL (ref 0–99.8)
POC MID %: 4.9 %M (ref 0–12)
RBC: 5.28 M/uL (ref 4.69–6.13)
WBC: 11.9 10*3/uL — AB (ref 4.6–10.2)

## 2012-03-25 LAB — COMPREHENSIVE METABOLIC PANEL
ALT: 24 U/L (ref 0–53)
CO2: 21 mEq/L (ref 19–32)
Calcium: 9.9 mg/dL (ref 8.4–10.5)
Chloride: 104 mEq/L (ref 96–112)
Creat: 0.95 mg/dL (ref 0.50–1.35)
Glucose, Bld: 90 mg/dL (ref 70–99)
Sodium: 138 mEq/L (ref 135–145)
Total Protein: 6.7 g/dL (ref 6.0–8.3)

## 2012-03-25 MED ORDER — LISINOPRIL 20 MG PO TABS: 20.0000 mg | ORAL_TABLET | Freq: Every day | ORAL | Status: DC

## 2012-03-25 MED ORDER — TRAMADOL HCL 50 MG PO TABS: ORAL_TABLET | ORAL | Status: DC

## 2012-03-25 MED ORDER — FLUTICASONE PROPIONATE 50 MCG/ACT NA SUSP: NASAL | Status: DC

## 2012-03-25 NOTE — Patient Instructions (Addendum)
1. Essential hypertension, benign  POCT CBC, Comprehensive metabolic panel, lisinopril (PRINIVIL,ZESTRIL) 20 MG tablet  2. Allergic rhinitis  POCT CBC, fluticasone (FLONASE) 50 MCG/ACT nasal spray  3. Tobacco abuse counseling    4. Dental caries  traMADol (ULTRAM) 50 MG tablet  5. Need for prophylactic vaccination and inoculation against influenza       CALL DENTIST THIS WEEK FOR APPOINTMENT. SCHEDULE FOLLOW-UP VISIT WITH DR. Tamala Julian IN 3 MONTHS (06/2012).

## 2012-03-25 NOTE — Progress Notes (Signed)
[place], [place]  [id number]   [phone]  Subjective:    Patient ID: Leroy Herrera, male    DOB: 25-Sep-1980, 31 y.o.   MRN: [id number]  HPIThis 31 y.o. male presents to establish care and for follow-up:    1.  Sinus congestion:  Onset one year ago with worsening in past two weeks.  No fever/chills/sweats.  +coughing; +PND.  No headache; +sinus pressure mild.  +rhinorrhea unknown color.  +nseezing a lot.  +nasal congestion.  No sputum production.  No SOB; no wheezing.  No v/d.  Has taken nothing for sinus congestion; wants to know if Claritin is a good choice.  2.  HTN:  Went down on dose from 60m to 137mLisinopril.  Trying to get pregnant; having problem with ED on 2041misinopril.  Denies CP/palp/SOB/leg swelling.  Denies headaches, dizziness, focal weakness, paresthesias.  Checks blood pressure at pharmacy and usually runs 120/70s.  Compliance with Lisinopril 50m70mily.    3.  Requesting flu vaccine.    4.  Tobacco abuse:  Smoking 1.5 ppd.  Ready to quit.  No smoking in house.  Smokes mainly at work and car ride.   Curious about electronic cigarette.  5.  Dental caries: no dental care in past 4-5 years.  taking Ibuprofen 800 five times per day.  Still waking up at night to take medication.  Requesting something stronger for pain.  History of heroine addiction.  Plans to contact dentist for full teeth extraction this month.    Review of Systems  Constitutional: Negative for fever, chills, diaphoresis and fatigue.  HENT: Positive for congestion, rhinorrhea, sneezing, dental problem, voice change and postnasal drip. Negative for ear pain, sore throat, mouth sores and trouble swallowing.   Respiratory: Positive for cough. Negative for choking, chest tightness, shortness of breath, wheezing and stridor.   Cardiovascular: Negative for chest pain, palpitations and leg swelling.  Gastrointestinal: Negative for nausea, vomiting, abdominal pain, diarrhea and constipation.    Neurological: Negative for dizziness, syncope, facial asymmetry, speech difficulty, weakness, light-headedness, numbness and headaches.    Past Medical History  Diagnosis Date  . Crohn's disease   . Hypertension   . Allergy   . Renal disorder     Nephrolithiasis  . Heroin addiction 06/23/2009    Remission since 2011.    Past Surgical History  Procedure Date  . Bowel resection   . Cholecystectomy   . Tonsillectomy   . Appendectomy     Prior to Admission medications   Medication Sig Start Date End Date Taking? Authorizing Provider  lisinopril (PRINIVIL,ZESTRIL) 20 MG tablet Take 1 tablet (20 mg total) by mouth daily. 03/25/12  Yes KrisWardell Honour  omeprazole (PRILOSEC) 10 MG capsule Take 10 mg by mouth daily.   Yes Historical Provider, MD  fluticasone (FLOAsencion Islam MCG/ACT nasal spray 2 sprays into each nostril daily for allergies 03/25/12   KrisWardell Honour  traMADol (ULTVeatrice Bourbon MG tablet 1-2 tablets qhs for dental pain 03/25/12   KrisWardell Honour    Allergies  Allergen Reactions  . Prednisone Anaphylaxis    High dose allergy  . Morphine And Related Hives  . Vicodin (Hydrocodone-Acetaminophen) Hives and Itching    History   Social History  . Marital Status: Married    Spouse Name: N/A    Number of Children: N/A  . Years of Education: N/A   Occupational History  . Not on file.   Social  History Main Topics  . Smoking status: Current Every Day Smoker -- 1.0 packs/day  . Smokeless tobacco: Never Used  . Alcohol Use: Yes     occasional  . Drug Use: No     PREVIOUS HEROIN ADDICTION 2011; RECOVERY SINCE 2011.  Marland Kitchen Sexually Active: Yes   Other Topics Concern  . Not on file   Social History Narrative   Marital status: Married x 1 year; second marriage. Divorced from first marriage due to heroin addiction.   Children: one daughter who lives with mother.   Lives: with wife.   Employment: auto repairs   Tobacco:  1.5 ppd   Alcohol: weekends   Drugs: recovering heroin  addict since 2011.  Narcotic abuse/overuse/misuse in past.   Exercise: none     No family history on file.     Objective:   Physical Exam  Constitutional: He is oriented to person, place, and time. He appears well-developed and well-nourished. No distress.  HENT:  Head: Normocephalic and atraumatic.  Right Ear: External ear normal.  Left Ear: External ear normal.  Nose: Nose normal.  Mouth/Throat: Oropharynx is clear and moist and mucous membranes are normal. No oral lesions. Abnormal dentition. Dental caries present. No dental abscesses, uvula swelling or lacerations. No oropharyngeal exudate.  Eyes: Conjunctivae normal are normal. Pupils are equal, round, and reactive to light.  Neck: Normal range of motion. Neck supple. No thyromegaly present.  Cardiovascular: Normal rate, regular rhythm, normal heart sounds and intact distal pulses.   No murmur heard. Pulmonary/Chest: Effort normal and breath sounds normal. No respiratory distress. He has no wheezes. He has no rales.  Abdominal: Soft. Bowel sounds are normal. He exhibits no distension. There is no tenderness. There is no rebound.  Lymphadenopathy:    He has no cervical adenopathy.  Neurological: He is alert and oriented to person, place, and time.  Skin: Skin is warm and dry. He is not diaphoretic.  Psychiatric: He has a normal mood and affect. His behavior is normal. Judgment and thought content normal.   INFLUENZA VACCINE ADMINISTERED.    Assessment & Plan:   1. Essential hypertension, benign  POCT CBC, Comprehensive metabolic panel  2. Allergic rhinitis  POCT CBC  3. Tobacco abuse counseling    4. Dental caries    5. Need for prophylactic vaccination and inoculation against influenza    6.  Heroine addiction, in remission.    1. HTN: uncontrolled; increase Lisinopril back up to 56m daily; obtain labs. F/u 3 months. 2.  Allergic Rhinitis: uncontrolled; start Claritin OTC 139mdaily; rx for Flonase provided to use  daily. 3.  Tobacco abuse counseling: contemplative.  Wean to 1 ppd and then consider nicotine patch. 4.  Dental caries: worsening.  Rx for Ultram provided.  Will not provide with narcotic due to history of heroin addiction; pt expressed understanding.  To contact dentist this week to have all teeth removed. 5.  S/p flu vaccine. 6. Heroine Addiction, in remission.  Narcotics contraindicated for patient; he expressed understanding.  Meds ordered this encounter  Medications  . DISCONTD: lisinopril (PRINIVIL,ZESTRIL) 10 MG tablet    Sig: Take 10 mg by mouth daily.  . Marland Kitchenmeprazole (PRILOSEC) 10 MG capsule    Sig: Take 10 mg by mouth daily.  . fluticasone (FLONASE) 50 MCG/ACT nasal spray    Sig: 2 sprays into each nostril daily for allergies    Dispense:  16 g    Refill:  11  . lisinopril (PRINIVIL,ZESTRIL) 20 MG tablet  Sig: Take 1 tablet (20 mg total) by mouth daily.    Dispense:  30 tablet    Refill:  5  . traMADol (ULTRAM) 50 MG tablet    Sig: 1-2 tablets qhs for dental pain    Dispense:  60 tablet    Refill:  0

## 2012-03-31 ENCOUNTER — Encounter: Payer: Self-pay | Admitting: Family Medicine

## 2012-03-31 NOTE — Progress Notes (Signed)
Reviewed and agree.

## 2012-04-01 ENCOUNTER — Encounter: Payer: Self-pay | Admitting: Radiology

## 2012-04-07 ENCOUNTER — Emergency Department (HOSPITAL_BASED_OUTPATIENT_CLINIC_OR_DEPARTMENT_OTHER)
Admission: EM | Admit: 2012-04-07 | Discharge: 2012-04-07 | Disposition: A | Discharge status: Home / Self Care | Payer: 59 | Attending: Emergency Medicine | Admitting: Emergency Medicine

## 2012-04-07 ENCOUNTER — Emergency Department (HOSPITAL_BASED_OUTPATIENT_CLINIC_OR_DEPARTMENT_OTHER): Payer: 59

## 2012-04-07 ENCOUNTER — Encounter (HOSPITAL_BASED_OUTPATIENT_CLINIC_OR_DEPARTMENT_OTHER): Payer: Self-pay | Admitting: *Deleted

## 2012-04-07 DIAGNOSIS — Z9089 Acquired absence of other organs: Secondary | ICD-10-CM | POA: Insufficient documentation

## 2012-04-07 DIAGNOSIS — I10 Essential (primary) hypertension: Secondary | ICD-10-CM | POA: Insufficient documentation

## 2012-04-07 DIAGNOSIS — K5090 Crohn's disease, unspecified, without complications: Secondary | ICD-10-CM | POA: Insufficient documentation

## 2012-04-07 LAB — URINALYSIS, ROUTINE W REFLEX MICROSCOPIC
Bilirubin Urine: NEGATIVE
Glucose, UA: NEGATIVE mg/dL
Hgb urine dipstick: NEGATIVE
Ketones, ur: NEGATIVE mg/dL
Leukocytes, UA: NEGATIVE
Nitrite: NEGATIVE
Protein, ur: NEGATIVE mg/dL
Specific Gravity, Urine: 1.028 (ref 1.005–1.030)
Urobilinogen, UA: 0.2 mg/dL (ref 0.0–1.0)
pH: 6 (ref 5.0–8.0)

## 2012-04-07 LAB — COMPREHENSIVE METABOLIC PANEL WITH GFR
ALT: 21 U/L (ref 0–53)
AST: 19 U/L (ref 0–37)
Albumin: 4.8 g/dL (ref 3.5–5.2)
Alkaline Phosphatase: 106 U/L (ref 39–117)
BUN: 13 mg/dL (ref 6–23)
CO2: 25 meq/L (ref 19–32)
Calcium: 9.8 mg/dL (ref 8.4–10.5)
Chloride: 98 meq/L (ref 96–112)
Creatinine, Ser: 1 mg/dL (ref 0.50–1.35)
GFR calc Af Amer: >90 mL/min (ref >90)
GFR calc non Af Amer: >90 mL/min (ref >90)
Glucose, Bld: 95 mg/dL (ref 70–99)
Potassium: 3.4 meq/L — ABNORMAL LOW (ref 3.5–5.1)
Sodium: 137 meq/L (ref 135–145)
Total Bilirubin: 0.5 mg/dL (ref 0.3–1.2)
Total Protein: 7.6 g/dL (ref 6.0–8.3)

## 2012-04-07 LAB — CBC WITH DIFFERENTIAL/PLATELET
Basophils Absolute: 0.1 K/uL (ref 0.0–0.1)
Basophils Relative: 1 % (ref 0–1)
Eosinophils Absolute: 0.1 K/uL (ref 0.0–0.7)
Eosinophils Relative: 1 % (ref 0–5)
HCT: 44.5 % (ref 39.0–52.0)
Hemoglobin: 16 g/dL (ref 13.0–17.0)
Lymphocytes Relative: 22 % (ref 12–46)
Lymphs Abs: 2.6 K/uL (ref 0.7–4.0)
MCH: 29.7 pg (ref 26.0–34.0)
MCHC: 36 g/dL (ref 30.0–36.0)
MCV: 82.6 fL (ref 78.0–100.0)
Monocytes Absolute: 0.8 K/uL (ref 0.1–1.0)
Monocytes Relative: 7 % (ref 3–12)
Neutro Abs: 8.1 K/uL — ABNORMAL HIGH (ref 1.7–7.7)
Neutrophils Relative %: 70 % (ref 43–77)
Platelets: 244 K/uL (ref 150–400)
RBC: 5.39 MIL/uL (ref 4.22–5.81)
RDW: 12.1 % (ref 11.5–15.5)
WBC: 11.7 K/uL — ABNORMAL HIGH (ref 4.0–10.5)

## 2012-04-07 MED ORDER — SODIUM CHLORIDE 0.9 % IV BOLUS (SEPSIS)
1000.0000 mL | Freq: Once | INTRAVENOUS | Status: AC
  Administered 2012-04-07: 1000 mL via INTRAVENOUS

## 2012-04-07 MED ORDER — OXYCODONE-ACETAMINOPHEN 5-325 MG PO TABS: 1.0000 | ORAL_TABLET | Freq: Four times a day (QID) | ORAL | Status: DC | PRN

## 2012-04-07 MED ORDER — PREDNISONE 10 MG PO TABS: 20.0000 mg | ORAL_TABLET | Freq: Two times a day (BID) | ORAL | Status: DC

## 2012-04-07 MED ORDER — HYDROMORPHONE HCL PF 1 MG/ML IJ SOLN
1.0000 mg | Freq: Once | INTRAMUSCULAR | Status: AC
  Administered 2012-04-07: 1 mg via INTRAVENOUS
  Filled 2012-04-07: qty 1

## 2012-04-07 MED ORDER — METHYLPREDNISOLONE SODIUM SUCC 125 MG IJ SOLR
125.0000 mg | Freq: Once | INTRAMUSCULAR | Status: AC
  Administered 2012-04-07: 125 mg via INTRAVENOUS
  Filled 2012-04-07: qty 2

## 2012-04-07 NOTE — ED Notes (Signed)
Pt c/o abd pain n/vd x 5 days

## 2012-04-07 NOTE — ED Provider Notes (Signed)
History     CSN: [id number]  Arrival date & time 04/07/12  1908   First MD Initiated Contact with Patient 04/07/12 2000      Chief Complaint  Patient presents with  . Abdominal Pain    (Consider location/radiation/quality/duration/timing/severity/associated sxs/prior treatment) HPI Comments: Patient with history of Crohn's disease followed by GI at [facility].  Has not had problems in 5 years and takes no medications.  Presents with complaints of upper abdominal pain for the past 5 days.  He denies any bloody stool but has had diarrhea.  There has been no fever or urinary complaints.    Patient is a 31 y.o. male presenting with abdominal pain. The history is provided by the patient.  Abdominal Pain The primary symptoms of the illness include abdominal pain, nausea and diarrhea. The primary symptoms of the illness do not include fever, vomiting or dysuria. Episode onset: 5 days ago. The onset of the illness was gradual. The problem has been gradually worsening.  The patient has had a change in bowel habit. Symptoms associated with the illness do not include chills, constipation or frequency.    Past Medical History  Diagnosis Date  . Crohn's disease   . Hypertension   . Allergy   . Renal disorder     Nephrolithiasis  . Heroin addiction 06/23/2009    Remission since 2011.    Past Surgical History  Procedure Date  . Bowel resection   . Cholecystectomy   . Tonsillectomy   . Appendectomy     History reviewed. No pertinent family history.  History  Substance Use Topics  . Smoking status: Current Every Day Smoker -- 1.0 packs/day  . Smokeless tobacco: Never Used  . Alcohol Use: Yes     occasional      Review of Systems  Constitutional: Negative for fever and chills.  Gastrointestinal: Positive for nausea, abdominal pain and diarrhea. Negative for vomiting and constipation.  Genitourinary: Negative for dysuria and frequency.  All other systems reviewed and are  negative.    Allergies  Prednisone; Morphine and related; and Vicodin  Home Medications   Current Outpatient Rx  Name Route Sig Dispense Refill  . FLUTICASONE PROPIONATE 50 MCG/ACT NA SUSP  2 sprays into each nostril daily for allergies 16 g 11  . LISINOPRIL 20 MG PO TABS Oral Take 1 tablet (20 mg total) by mouth daily. 30 tablet 5  . OMEPRAZOLE 10 MG PO CPDR Oral Take 10 mg by mouth daily.    . TRAMADOL HCL 50 MG PO TABS  1-2 tablets qhs for dental pain 60 tablet 0    BP 144/97  Pulse 99  Temp 98.8 F (37.1 C) (Oral)  Resp 16  Ht 6' (1.829 m)  Wt 185 lb (83.915 kg)  BMI 25.09 kg/m2  SpO2 100%  Physical Exam  Nursing note and vitals reviewed. Constitutional: He is oriented to person, place, and time. He appears well-developed and well-nourished. No distress.  HENT:  Head: Normocephalic and atraumatic.  Mouth/Throat: Oropharynx is clear and moist.  Neck: Normal range of motion. Neck supple.  Cardiovascular: Normal rate and regular rhythm.   No murmur heard. Pulmonary/Chest: Effort normal and breath sounds normal.  Abdominal: Soft. Bowel sounds are normal.       There is mild ttp in the epigastrium and luq.  There is no rebound or guarding.  The surgical is noted in the midline in the lower abdomen from prior bowel resections.  Musculoskeletal: Normal range of motion. He  exhibits no edema.  Neurological: He is alert and oriented to person, place, and time.  Skin: Skin is warm and dry. He is not diaphoretic.    ED Course  Procedures (including critical care time)  Labs Reviewed  CBC WITH DIFFERENTIAL - Abnormal; Notable for the following:    WBC 11.7 (*)     Neutro Abs 8.1 (*)     All other components within normal limits  URINALYSIS, ROUTINE W REFLEX MICROSCOPIC  COMPREHENSIVE METABOLIC PANEL   No results found.   No diagnosis found.    MDM  The labs and xrays reveal a mild wbc but obstruction or signs of perforation.  He is feeling better with medications  given in the ED.  I suspect this is a flare of his Crohn's, but have also considered gastroenteritis.  He has had both his appendix and gallbladder removed, thus removing them from the differential.  He will be discharged with prednisone and percocet.  He has been advised to follow up with his Gastroenterologist in the near future.        Veryl Speak, MD 04/07/12 2134

## 2012-04-07 NOTE — ED Notes (Signed)
Dr. Stark Jock at bedside.

## 2012-06-02 ENCOUNTER — Emergency Department (HOSPITAL_BASED_OUTPATIENT_CLINIC_OR_DEPARTMENT_OTHER): Payer: 59

## 2012-06-02 ENCOUNTER — Emergency Department (HOSPITAL_BASED_OUTPATIENT_CLINIC_OR_DEPARTMENT_OTHER)
Admission: EM | Admit: 2012-06-02 | Discharge: 2012-06-02 | Disposition: A | Discharge status: Home / Self Care | Payer: 59 | Attending: Emergency Medicine | Admitting: Emergency Medicine

## 2012-06-02 ENCOUNTER — Encounter (HOSPITAL_BASED_OUTPATIENT_CLINIC_OR_DEPARTMENT_OTHER): Payer: Self-pay | Admitting: *Deleted

## 2012-06-02 DIAGNOSIS — Z87448 Personal history of other diseases of urinary system: Secondary | ICD-10-CM | POA: Insufficient documentation

## 2012-06-02 DIAGNOSIS — K5090 Crohn's disease, unspecified, without complications: Secondary | ICD-10-CM | POA: Insufficient documentation

## 2012-06-02 DIAGNOSIS — F17200 Nicotine dependence, unspecified, uncomplicated: Secondary | ICD-10-CM | POA: Insufficient documentation

## 2012-06-02 DIAGNOSIS — I10 Essential (primary) hypertension: Secondary | ICD-10-CM | POA: Insufficient documentation

## 2012-06-02 DIAGNOSIS — R319 Hematuria, unspecified: Secondary | ICD-10-CM

## 2012-06-02 DIAGNOSIS — N390 Urinary tract infection, site not specified: Secondary | ICD-10-CM | POA: Insufficient documentation

## 2012-06-02 LAB — BASIC METABOLIC PANEL
BUN: 12 mg/dL (ref 6–23)
Calcium: 9.7 mg/dL (ref 8.4–10.5)
GFR calc Af Amer: >90 mL/min (ref >90)
GFR calc non Af Amer: 87 mL/min — ABNORMAL LOW (ref >90)
Potassium: 3.8 mEq/L (ref 3.5–5.1)

## 2012-06-02 LAB — URINALYSIS, ROUTINE W REFLEX MICROSCOPIC
Bilirubin Urine: NEGATIVE
Nitrite: NEGATIVE
Specific Gravity, Urine: 1.026 (ref 1.005–1.030)
Urobilinogen, UA: 0.2 mg/dL (ref 0.0–1.0)

## 2012-06-02 LAB — URINE MICROSCOPIC-ADD ON

## 2012-06-02 MED ORDER — OXYCODONE-ACETAMINOPHEN 5-325 MG PO TABS: 2.0000 | ORAL_TABLET | ORAL | Status: DC | PRN

## 2012-06-02 MED ORDER — HYDROMORPHONE HCL PF 1 MG/ML IJ SOLN
1.0000 mg | Freq: Once | INTRAMUSCULAR | Status: AC
  Administered 2012-06-02: 1 mg via INTRAVENOUS
  Filled 2012-06-02: qty 1

## 2012-06-02 MED ORDER — ONDANSETRON HCL 4 MG/2ML IJ SOLN
4.0000 mg | Freq: Once | INTRAMUSCULAR | Status: AC
  Administered 2012-06-02: 4 mg via INTRAVENOUS
  Filled 2012-06-02: qty 2

## 2012-06-02 MED ORDER — SODIUM CHLORIDE 0.9 % IV SOLN
Freq: Once | INTRAVENOUS | Status: AC
  Administered 2012-06-02: 18:00:00 via INTRAVENOUS

## 2012-06-02 NOTE — ED Provider Notes (Signed)
Medical screening examination/treatment/procedure(s) were performed by non-physician practitioner and as supervising physician I was immediately available for consultation/collaboration.   Ezequiel Essex, MD 06/02/12 [phone]

## 2012-06-02 NOTE — ED Notes (Signed)
Patient transported to CT 

## 2012-06-02 NOTE — ED Provider Notes (Signed)
History     CSN: [id number]  Arrival date & time 06/02/12  1651   First MD Initiated Contact with Patient 06/02/12 1712      Chief Complaint  Patient presents with  . Flank Pain    (Consider location/radiation/quality/duration/timing/severity/associated sxs/prior treatment) HPI Comments: Pt state that he is having right flank pain radiating to his groin consistent with previous stones:pt denies fevers:denies fever, diarrhea:pt had one episode of vomiting today  Patient is a 31 y.o. male presenting with flank pain. The history is provided by the patient. No language interpreter was used.  Flank Pain This is a new problem. The current episode started yesterday. The problem occurs constantly. The problem has been unchanged. Pertinent negatives include no fever. Nothing aggravates the symptoms. He has tried nothing for the symptoms.    Past Medical History  Diagnosis Date  . Crohn's disease   . Hypertension   . Allergy   . Renal disorder     Nephrolithiasis  . Heroin addiction 06/23/2009    Remission since 2011.    Past Surgical History  Procedure Date  . Bowel resection   . Cholecystectomy   . Tonsillectomy   . Appendectomy     History reviewed. No pertinent family history.  History  Substance Use Topics  . Smoking status: Current Every Day Smoker -- 1.0 packs/day  . Smokeless tobacco: Never Used  . Alcohol Use: Yes     Comment: occasional      Review of Systems  Constitutional: Negative for fever.  Respiratory: Negative.   Cardiovascular: Negative.   Genitourinary: Positive for flank pain.    Allergies  Prednisone; Morphine and related; and Vicodin  Home Medications   Current Outpatient Rx  Name  Route  Sig  Dispense  Refill  . FLUTICASONE PROPIONATE 50 MCG/ACT NA SUSP      2 sprays into each nostril daily for allergies   16 g   11   . LISINOPRIL 20 MG PO TABS   Oral   Take 1 tablet (20 mg total) by mouth daily.   30 tablet   5   .  OMEPRAZOLE 10 MG PO CPDR   Oral   Take 10 mg by mouth daily.         . OXYCODONE-ACETAMINOPHEN 5-325 MG PO TABS   Oral   Take 1-2 tablets by mouth every 6 (six) hours as needed for pain.   20 tablet   0   . PREDNISONE 10 MG PO TABS   Oral   Take 2 tablets (20 mg total) by mouth 2 (two) times daily.   20 tablet   0   . TRAMADOL HCL 50 MG PO TABS      1-2 tablets qhs for dental pain   60 tablet   0     BP 171/94  Pulse 102  Temp 98.6 F (37 C)  Resp 16  Ht 6' (1.829 m)  Wt 185 lb (83.915 kg)  BMI 25.09 kg/m2  SpO2 98%  Physical Exam  Nursing note and vitals reviewed. Constitutional: He appears well-developed and well-nourished.  HENT:  Head: Normocephalic and atraumatic.  Eyes: Conjunctivae normal and EOM are normal.  Neck: Neck supple.  Cardiovascular: Normal rate and regular rhythm.   Pulmonary/Chest: Effort normal and breath sounds normal.  Abdominal: Soft. Bowel sounds are normal. There is no tenderness.  Musculoskeletal: Normal range of motion.  Neurological: He is alert.  Skin: Skin is warm and dry.  Psychiatric: He has a normal  mood and affect.    ED Course  Procedures (including critical care time)  Labs Reviewed  URINALYSIS, ROUTINE W REFLEX MICROSCOPIC - Abnormal; Notable for the following:    Hgb urine dipstick LARGE (*)     All other components within normal limits  URINE MICROSCOPIC-ADD ON  BASIC METABOLIC PANEL   Ct Abdomen Pelvis Wo Contrast  06/02/2012  *RADIOLOGY REPORT*  Clinical Data: Right flank pain.  Right lower quadrant pain. History of Crohn's disease.  CT ABDOMEN AND PELVIS WITHOUT CONTRAST  Technique:  Multidetector CT imaging of the abdomen and pelvis was performed following the standard protocol without intravenous contrast.  Comparison: CT abdomen 01/02/2012.  Findings: Lung Bases: Normal.  Liver:  Unenhanced CT was performed per clinician order.  Lack of IV contrast limits sensitivity and specificity, especially for  evaluation of abdominal/pelvic solid viscera.  Normal.  Spleen:  Normal.  Gallbladder:  Cholecystectomy.  Common bile duct:  Normal.  Pancreas:  Normal.  Adrenal glands:  Normal.  Kidneys:  No renal calculi.  No inflammatory changes or hydronephrosis.  Both ureters are within normal limits.  Stomach:  Distended with food.  No inflammatory changes.  Small bowel:  No mural thickening or evidence of obstruction.  No mesenteric adenopathy.  Colon:   Fecalization of the terminal ileum.  Appendix not identified.  Ileocolonic anastomosis in the right lower quadrant. Ascending colectomy.  Pelvic Genitourinary:  No free fluid.  Urinary bladder is decompressed.  No adenopathy.  Bones:  No aggressive osseous lesions.  Vasculature: Grossly normal.  IMPRESSION: 1.  No acute abnormality. 2.  Postoperative changes in the right lower quadrant, with resection of the cecum and a portion of the ascending colon. 3.  No inflammatory changes of bowel in this patient with history of Crohn's disease. 4.  Cholecystectomy.   Original Report Authenticated By: Dereck Ligas, M.D.      1. Hematuria       MDM  No stone noted:pt told to follow up on hematuria:urine sent for culture:pt afebrile and abdomen is benign:will treat with percocet:Pt is okay to go home        Glendell Docker, NP 06/02/12 1848

## 2012-06-02 NOTE — ED Notes (Signed)
Pt c/o right flank pain x 2 days HX kidney stone

## 2012-06-03 ENCOUNTER — Ambulatory Visit (INDEPENDENT_AMBULATORY_CARE_PROVIDER_SITE_OTHER): Payer: 59 | Admitting: Family Medicine

## 2012-06-03 VITALS — BP 129/87 | HR 75 | Temp 98.6°F | Resp 16 | Ht 72.5 in | Wt 177.4 lb

## 2012-06-03 DIAGNOSIS — R109 Unspecified abdominal pain: Secondary | ICD-10-CM

## 2012-06-03 DIAGNOSIS — K0889 Other specified disorders of teeth and supporting structures: Secondary | ICD-10-CM

## 2012-06-03 DIAGNOSIS — K029 Dental caries, unspecified: Secondary | ICD-10-CM

## 2012-06-03 DIAGNOSIS — K5090 Crohn's disease, unspecified, without complications: Secondary | ICD-10-CM

## 2012-06-03 DIAGNOSIS — I10 Essential (primary) hypertension: Secondary | ICD-10-CM

## 2012-06-03 DIAGNOSIS — N200 Calculus of kidney: Secondary | ICD-10-CM

## 2012-06-03 MED ORDER — TRAMADOL HCL 50 MG PO TABS: ORAL_TABLET | ORAL | Status: DC

## 2012-06-03 NOTE — Progress Notes (Signed)
[place], [place]  [id number]   [id number]  Subjective:    Patient ID: Leroy Herrera, male    DOB: 07/21/1980, 31 y.o.   MRN: [id number]  HPIThis 31 y.o. male presents for two month follow-up:  1.  R flank pain, severe suprapubic pain: onset two days ago.  Presented to ED yesterday; blood on urine micro; no gross hematuria.  S/p CT abd/pelvis negative.  Fifteen minutes after returning home, passed kidney stone.  Tiny stone.  Dysuria mild now; usually burns for two days after passing kidney stones.    2.  HTN:  Two month follow-up; Increased Lisinopril to 69m daily.  Tolerating increase in medication without side effects; blood pressure readings have improved.  Reports good tolerance to treatment, good compliance with treatment; good symptom control.  Denies CP/palp/SOB/leg swelling; denies HA/dizziness/focal weakness/paresthesias.  3.  Abdominal pain:  Recurrent issues; associated with nausea.  [facility] but has not been scheduled.  S/p CT scan with possible inflammation.  No medication prescribed.   +n/v.  Unable to eat.  Typical of Crohn's exacerbation.  No stomach issues in past several weeks.    4.  Allergic Rhinitis:  Stable.  Not as bad; using Flonase PRN.    5.  Dental caries:  Cannot afford retraction at this time.  First of the year will be better; will be getting taxes back and will use money for retraction.  Tramadol worked well; requesting refill; took two tablets at bedtime.  Review of Systems  Constitutional: Negative for fever, chills, diaphoresis and fatigue.  HENT: Positive for congestion, rhinorrhea, sneezing and postnasal drip. Negative for ear pain.   Respiratory: Negative for shortness of breath and wheezing.   Cardiovascular: Negative for chest pain, palpitations and leg swelling.  Gastrointestinal: Positive for nausea, abdominal pain and abdominal distention. Negative for vomiting,  constipation, blood in stool, anal bleeding and rectal pain.  Genitourinary: Positive for frequency and flank pain. Negative for urgency, hematuria, penile swelling, scrotal swelling, genital sores, penile pain and testicular pain.  Skin: Negative for rash.        Past Medical History  Diagnosis Date  . Crohn's disease   . Hypertension   . Allergy   . Renal disorder     Nephrolithiasis  . Heroin addiction 06/23/2009    Remission since 2011.    Past Surgical History  Procedure Date  . Bowel resection   . Cholecystectomy   . Tonsillectomy   . Appendectomy   . Colon surgery     Prior to Admission medications   Medication Sig Start Date End Date Taking? Authorizing Provider  lisinopril (PRINIVIL,ZESTRIL) 20 MG tablet Take 1 tablet (20 mg total) by mouth daily. 03/25/12  Yes KWardell Honour MD  omeprazole (PRILOSEC) 10 MG capsule Take 10 mg by mouth daily.   Yes Historical Provider, MD  oxyCODONE-acetaminophen (PERCOCET/ROXICET) 5-325 MG per tablet Take 2 tablets by mouth every 4 (four) hours as needed for pain. 06/02/12  Yes VGlendell Docker NP  fluticasone (FLONASE) 50 MCG/ACT nasal spray 2 sprays into each nostril daily for allergies 03/25/12   KWardell Honour MD  oxyCODONE-acetaminophen (PERCOCET/ROXICET) 5-325 MG per tablet Take 1-2 tablets by mouth every 6 (six) hours as needed for pain. 04/07/12   DVeryl Speak MD  predniSONE (DELTASONE) 10 MG tablet Take 2 tablets (20 mg total) by mouth 2 (two) times daily. 04/07/12   DVeryl Speak MD  traMADol (ULTRAM) 50 MG tablet 1-2 tablets qhs for dental pain 06/03/12   Wardell Honour, MD    Allergies  Allergen Reactions  . Prednisone Anaphylaxis    High dose allergy  . Morphine And Related Hives  . Vicodin (Hydrocodone-Acetaminophen) Hives and Itching    History   Social History  . Marital Status: Married    Spouse Name: N/A    Number of Children: N/A  . Years of Education: N/A   Occupational History  . Not on file.    Social History Main Topics  . Smoking status: Current Every Day Smoker -- 1.5 packs/day  . Smokeless tobacco: Never Used  . Alcohol Use: No     Comment: occasional  . Drug Use: No     Comment: PREVIOUS HEROIN ADDICTION 2011; RECOVERY SINCE 2011.  Marland Kitchen Sexually Active: Yes   Other Topics Concern  . Not on file   Social History Narrative   Marital status: Married x 1 year; second marriage. Divorced from first marriage due to heroin addiction.   Children: one daughter who lives with mother.   Lives: with wife.   Employment: auto repairs   Tobacco:  1.5 ppd   Alcohol: weekends   Drugs: recovering heroin addict since 2011.  Narcotic abuse/overuse/misuse in past.   Exercise: none     No family history on file.  Objective:   Physical Exam  Nursing note and vitals reviewed. Constitutional: He is oriented to person, place, and time. He appears well-developed and well-nourished. No distress.  Eyes: Conjunctivae normal and EOM are normal. Pupils are equal, round, and reactive to light.  Neck: Normal range of motion. Neck supple. No thyromegaly present.  Cardiovascular: Normal rate, regular rhythm, normal heart sounds and intact distal pulses.  Exam reveals no gallop and no friction rub.   No murmur heard. Pulmonary/Chest: Effort normal and breath sounds normal. He has no wheezes. He has no rales.  Abdominal: Soft. Bowel sounds are normal. He exhibits no distension and no mass. There is no tenderness. There is no rebound and no guarding.  Lymphadenopathy:    He has no cervical adenopathy.  Neurological: He is alert and oriented to person, place, and time. No cranial nerve deficit. He exhibits normal muscle tone. Coordination normal.  Skin: No rash noted. He is not diaphoretic.  Psychiatric: He has a normal mood and affect. His behavior is normal. Judgment and thought content normal.   UNABLE TO PROVIDE URINE SPECIMEN.    Assessment & Plan:   1. Essential hypertension, benign    2.  Nephrolithiasis    3. Abdominal pain    4. Crohn's disease    5. Tooth pain  traMADol (ULTRAM) 50 MG tablet, DISCONTINUED: traMADol (ULTRAM) 50 MG tablet  6. Dental caries  traMADol (ULTRAM) 50 MG tablet, DISCONTINUED: traMADol (ULTRAM) 50 MG tablet

## 2012-06-03 NOTE — Patient Instructions (Addendum)
1. Essential hypertension, benign   2. Nephrolithiasis   3. Abdominal pain   4. Crohn's disease      FOLLOW-UP IN SIX MONTHS.

## 2012-06-04 LAB — URINE CULTURE
Colony Count: NO GROWTH
Culture: NO GROWTH

## 2012-06-07 DIAGNOSIS — K029 Dental caries, unspecified: Secondary | ICD-10-CM | POA: Insufficient documentation

## 2012-06-07 DIAGNOSIS — R109 Unspecified abdominal pain: Secondary | ICD-10-CM | POA: Insufficient documentation

## 2012-06-07 DIAGNOSIS — K5090 Crohn's disease, unspecified, without complications: Secondary | ICD-10-CM | POA: Insufficient documentation

## 2012-06-07 DIAGNOSIS — I10 Essential (primary) hypertension: Secondary | ICD-10-CM | POA: Insufficient documentation

## 2012-06-07 DIAGNOSIS — N200 Calculus of kidney: Secondary | ICD-10-CM | POA: Insufficient documentation

## 2012-06-07 NOTE — Assessment & Plan Note (Signed)
Persistent; plans to undergo retraction after first of year with return of income tax money; agreeable to refill of Ultram for nighttime pain.

## 2012-06-07 NOTE — Assessment & Plan Note (Signed)
Recurrent episode in past week; presented to ED with flank pain with large blood on u/a.  Urine culture pending.  Identified stone after returning home from ED.  To contact urology regarding the need for analysis of stone.

## 2012-06-07 NOTE — Assessment & Plan Note (Signed)
New.  Recent abdominal pain similar to previous Crohn's exacerbations; now clinically improved; has followed up recently with GI. To undergo repeat colonoscopy if symptoms recur.

## 2012-06-07 NOTE — Assessment & Plan Note (Signed)
Possible worsening recently; s/p evaluation by GI at [facility]; to undergo colonoscopy if symptoms worsen/recur.

## 2012-06-07 NOTE — Assessment & Plan Note (Signed)
Improved; reviewed recent BMET obtained in ED for flank pain.  No changes to management at this time.  Follow-up in six months.

## 2012-06-08 NOTE — Progress Notes (Signed)
Reviewed and agree.

## 2012-07-03 ENCOUNTER — Other Ambulatory Visit: Payer: Self-pay | Admitting: Family Medicine

## 2012-08-02 ENCOUNTER — Other Ambulatory Visit: Payer: Self-pay | Admitting: Physician Assistant

## 2012-08-02 NOTE — Telephone Encounter (Signed)
When is patient planning on seeing a dentist.

## 2012-08-24 ENCOUNTER — Telehealth: Payer: Self-pay | Admitting: *Deleted

## 2012-08-24 NOTE — Telephone Encounter (Signed)
Requested Medications    Medication name:  Name from pharmacy:  traMADol (ULTRAM) 50 MG tablet  TRAMADOL HCL 50 MG TABLET   Sig: TAKE 1 OR 2 TABLETS BY MOUTH AT BEDTIME FOR DENTAL PAIN   Dispense: 60 tablet Refills: 0 Start: 08/02/2012  Class: Normal   Requested on: 07/04/2012   Originally ordered on: 03/25/2012 Last refill: 07/04/2012 Order History and Details         Call Documentation    Mancel Bale, PA-C at 08/02/2012 6:29 PM    Status: Signed             When is patient planning on seeing a dentist.

## 2012-08-24 NOTE — Telephone Encounter (Signed)
Pt has seen dentist and is having teeth pulled on April 1st

## 2012-09-12 ENCOUNTER — Emergency Department (HOSPITAL_COMMUNITY): Payer: 59

## 2012-09-12 ENCOUNTER — Emergency Department (HOSPITAL_COMMUNITY)
Admission: EM | Admit: 2012-09-12 | Discharge: 2012-09-12 | Disposition: A | Discharge status: Home / Self Care | Payer: 59 | Attending: Emergency Medicine | Admitting: Emergency Medicine

## 2012-09-12 ENCOUNTER — Encounter (HOSPITAL_COMMUNITY): Payer: Self-pay

## 2012-09-12 DIAGNOSIS — Z87442 Personal history of urinary calculi: Secondary | ICD-10-CM | POA: Insufficient documentation

## 2012-09-12 DIAGNOSIS — Z79899 Other long term (current) drug therapy: Secondary | ICD-10-CM | POA: Insufficient documentation

## 2012-09-12 DIAGNOSIS — K5090 Crohn's disease, unspecified, without complications: Secondary | ICD-10-CM | POA: Insufficient documentation

## 2012-09-12 DIAGNOSIS — F17200 Nicotine dependence, unspecified, uncomplicated: Secondary | ICD-10-CM | POA: Insufficient documentation

## 2012-09-12 DIAGNOSIS — I10 Essential (primary) hypertension: Secondary | ICD-10-CM | POA: Insufficient documentation

## 2012-09-12 DIAGNOSIS — K859 Acute pancreatitis without necrosis or infection, unspecified: Secondary | ICD-10-CM | POA: Insufficient documentation

## 2012-09-12 DIAGNOSIS — R109 Unspecified abdominal pain: Secondary | ICD-10-CM | POA: Insufficient documentation

## 2012-09-12 DIAGNOSIS — F1121 Opioid dependence, in remission: Secondary | ICD-10-CM | POA: Insufficient documentation

## 2012-09-12 HISTORY — DX: Acute pancreatitis without necrosis or infection, unspecified: K85.90

## 2012-09-12 LAB — CBC WITH DIFFERENTIAL/PLATELET
Basophils Absolute: 0.1 10*3/uL (ref 0.0–0.1)
Basophils Relative: 1 % (ref 0–1)
Eosinophils Absolute: 0.2 10*3/uL (ref 0.0–0.7)
MCHC: 35.2 g/dL (ref 30.0–36.0)
Neutro Abs: 6.6 10*3/uL (ref 1.7–7.7)
Neutrophils Relative %: 63 % (ref 43–77)
Platelets: 222 10*3/uL (ref 150–400)
RDW: 12.3 % (ref 11.5–15.5)

## 2012-09-12 LAB — COMPREHENSIVE METABOLIC PANEL
AST: 16 U/L (ref 0–37)
Albumin: 4 g/dL (ref 3.5–5.2)
Alkaline Phosphatase: 110 U/L (ref 39–117)
Chloride: 101 mEq/L (ref 96–112)
Potassium: 3.6 mEq/L (ref 3.5–5.1)
Total Bilirubin: 0.2 mg/dL — ABNORMAL LOW (ref 0.3–1.2)
Total Protein: 7.2 g/dL (ref 6.0–8.3)

## 2012-09-12 LAB — URINALYSIS, ROUTINE W REFLEX MICROSCOPIC
Bilirubin Urine: NEGATIVE
Hgb urine dipstick: NEGATIVE
Nitrite: NEGATIVE
Specific Gravity, Urine: 1.026 (ref 1.005–1.030)
Urobilinogen, UA: 0.2 mg/dL (ref 0.0–1.0)
pH: 5.5 (ref 5.0–8.0)

## 2012-09-12 LAB — LIPASE, BLOOD: Lipase: 25 U/L (ref 11–59)

## 2012-09-12 MED ORDER — ONDANSETRON HCL 4 MG/2ML IJ SOLN
4.0000 mg | Freq: Once | INTRAMUSCULAR | Status: AC
  Administered 2012-09-12: 4 mg via INTRAVENOUS
  Filled 2012-09-12: qty 2

## 2012-09-12 MED ORDER — PROMETHAZINE HCL 25 MG PO TABS: 25.0000 mg | ORAL_TABLET | Freq: Four times a day (QID) | ORAL | Status: DC | PRN

## 2012-09-12 MED ORDER — TRAMADOL HCL 50 MG PO TABS: 50.0000 mg | ORAL_TABLET | Freq: Four times a day (QID) | ORAL | Status: DC | PRN

## 2012-09-12 MED ORDER — HYDROMORPHONE HCL PF 1 MG/ML IJ SOLN
1.0000 mg | Freq: Once | INTRAMUSCULAR | Status: AC
  Administered 2012-09-12: 1 mg via INTRAVENOUS
  Filled 2012-09-12: qty 1

## 2012-09-12 MED ORDER — IOHEXOL 300 MG/ML  SOLN
50.0000 mL | Freq: Once | INTRAMUSCULAR | Status: AC | PRN
  Administered 2012-09-12: 50 mL via ORAL

## 2012-09-12 MED ORDER — SODIUM CHLORIDE 0.9 % IV SOLN
20.0000 mL | INTRAVENOUS | Status: DC
  Administered 2012-09-12 (×2): 20 mL via INTRAVENOUS

## 2012-09-12 MED ORDER — IOHEXOL 300 MG/ML  SOLN
100.0000 mL | Freq: Once | INTRAMUSCULAR | Status: AC | PRN
  Administered 2012-09-12: 100 mL via INTRAVENOUS

## 2012-09-12 NOTE — ED Notes (Signed)
Patient started having sharp abdominal pain last night. Had pinto beans last night-he thought it was gas from beans however, pain did not subside. Rates pain 7/10 now but 10/10 pain. Not pass flatus or burping.Pain appears to be localized right above umbilicus. Hx crohns disease. Diarrhea on 3/21 but normal BM on 3/22. Nauseous constantly. No vomiting. Hyperactive bowels sounds, soft.

## 2012-09-12 NOTE — ED Notes (Signed)
MD at bedside. 

## 2012-09-12 NOTE — ED Notes (Signed)
Pt presents with NAD- upper stomach pain started this am denies fever N/V/D. HX of pancreatitis and Chron's.  Feels more like pancreatitis. No ETOH use recently

## 2012-09-18 NOTE — ED Provider Notes (Signed)
History     CSN: [id number]  Arrival date & time 09/12/12  67   First MD Initiated Contact with Patient 09/12/12 1746      Chief Complaint  Patient presents with  . Abdominal Pain    (Consider location/radiation/quality/duration/timing/severity/associated sxs/prior treatment) HPI.... generalized abdominal pain since last night. No radiation. Associated with nausea. No vomiting or diarrhea.  Nothing makes symptoms better or worse. Severity is moderate. Patient allegedly has history of pancreatitis and Crohn's disease  Past Medical History  Diagnosis Date  . Crohn's disease   . Hypertension   . Allergy   . Renal disorder     Nephrolithiasis  . Heroin addiction 06/23/2009    Remission since 2011.  Marland Kitchen Pancreatitis     Past Surgical History  Procedure Laterality Date  . Bowel resection    . Cholecystectomy    . Tonsillectomy    . Appendectomy    . Colon surgery      No family history on file.  History  Substance Use Topics  . Smoking status: Current Every Day Smoker -- 1.50 packs/day  . Smokeless tobacco: Never Used  . Alcohol Use: Yes     Comment: occasional      Review of Systems  All other systems reviewed and are negative.    Allergies  Prednisone; Morphine and related; and Vicodin  Home Medications   Current Outpatient Rx  Name  Route  Sig  Dispense  Refill  . ibuprofen (ADVIL,MOTRIN) 200 MG tablet   Oral   Take 800 mg by mouth every 6 (six) hours as needed for pain.         Marland Kitchen lisinopril (PRINIVIL,ZESTRIL) 20 MG tablet   Oral   Take 1 tablet (20 mg total) by mouth daily.   30 tablet   5   . omeprazole (PRILOSEC) 20 MG capsule   Oral   Take 20 mg by mouth 2 (two) times daily.         . promethazine (PHENERGAN) 25 MG tablet   Oral   Take 1 tablet (25 mg total) by mouth every 6 (six) hours as needed for nausea.   20 tablet   0   . traMADol (ULTRAM) 50 MG tablet   Oral   Take 1 tablet (50 mg total) by mouth every 6 (six) hours as  needed for pain.   20 tablet   0     BP 121/83  Pulse 84  Temp(Src) 98.3 F (36.8 C) (Oral)  Resp 18  Ht 6' (1.829 m)  Wt 180 lb (81.647 kg)  BMI 24.41 kg/m2  SpO2 95%  Physical Exam  Nursing note and vitals reviewed. Constitutional: He is oriented to person, place, and time. He appears well-developed and well-nourished.  HENT:  Head: Normocephalic and atraumatic.  Eyes: Conjunctivae and EOM are normal. Pupils are equal, round, and reactive to light.  Neck: Normal range of motion. Neck supple.  Cardiovascular: Normal rate, regular rhythm and normal heart sounds.   Pulmonary/Chest: Effort normal and breath sounds normal.  Abdominal: Soft. Bowel sounds are normal.  Minimal diffuse tenderness  Musculoskeletal: Normal range of motion.  Neurological: He is alert and oriented to person, place, and time.  Skin: Skin is warm and dry.  Psychiatric: He has a normal mood and affect.    ED Course  Procedures (including critical care time)  Labs Reviewed  COMPREHENSIVE METABOLIC PANEL - Abnormal; Notable for the following:    Total Bilirubin 0.2 (*)    All  other components within normal limits  CBC WITH DIFFERENTIAL  AMYLASE  LIPASE, BLOOD  URINALYSIS, ROUTINE W REFLEX MICROSCOPIC   No results found.   1. Abdominal pain       MDM  Patient feels better after IV fluids and pain management.  White count and lipase normal.  CT abdomen/pelvis show no acute findings.        Nat Christen, MD 09/18/12 [phone]

## 2012-09-28 ENCOUNTER — Telehealth: Payer: Self-pay

## 2012-09-28 NOTE — Telephone Encounter (Signed)
Patient is calling to see if he can get A refill on his acid reflux meds [phone]

## 2012-09-28 NOTE — Telephone Encounter (Signed)
Omeprazole 20 mg

## 2012-09-29 MED ORDER — OMEPRAZOLE 20 MG PO CPDR: 20.0000 mg | DELAYED_RELEASE_CAPSULE | Freq: Two times a day (BID) | ORAL | Status: DC

## 2012-09-29 NOTE — Telephone Encounter (Signed)
Is he taking BID or daily?

## 2012-09-29 NOTE — Telephone Encounter (Signed)
Called him, he states he takes bid, once a day does not help. Leroy Herrera has advised okay to send, this is done.

## 2012-10-04 ENCOUNTER — Ambulatory Visit: Payer: 59 | Admitting: Family Medicine

## 2012-10-27 ENCOUNTER — Emergency Department (HOSPITAL_BASED_OUTPATIENT_CLINIC_OR_DEPARTMENT_OTHER)
Admission: EM | Admit: 2012-10-27 | Discharge: 2012-10-27 | Disposition: A | Discharge status: Home / Self Care | Payer: 59 | Attending: Emergency Medicine | Admitting: Emergency Medicine

## 2012-10-27 ENCOUNTER — Encounter (HOSPITAL_BASED_OUTPATIENT_CLINIC_OR_DEPARTMENT_OTHER): Payer: Self-pay | Admitting: *Deleted

## 2012-10-27 DIAGNOSIS — R197 Diarrhea, unspecified: Secondary | ICD-10-CM | POA: Insufficient documentation

## 2012-10-27 DIAGNOSIS — Z87442 Personal history of urinary calculi: Secondary | ICD-10-CM | POA: Insufficient documentation

## 2012-10-27 DIAGNOSIS — Z8719 Personal history of other diseases of the digestive system: Secondary | ICD-10-CM | POA: Insufficient documentation

## 2012-10-27 DIAGNOSIS — F17200 Nicotine dependence, unspecified, uncomplicated: Secondary | ICD-10-CM | POA: Insufficient documentation

## 2012-10-27 DIAGNOSIS — Z79899 Other long term (current) drug therapy: Secondary | ICD-10-CM | POA: Insufficient documentation

## 2012-10-27 DIAGNOSIS — R1110 Vomiting, unspecified: Secondary | ICD-10-CM | POA: Insufficient documentation

## 2012-10-27 DIAGNOSIS — R1084 Generalized abdominal pain: Secondary | ICD-10-CM | POA: Insufficient documentation

## 2012-10-27 DIAGNOSIS — I10 Essential (primary) hypertension: Secondary | ICD-10-CM | POA: Insufficient documentation

## 2012-10-27 DIAGNOSIS — E876 Hypokalemia: Secondary | ICD-10-CM | POA: Insufficient documentation

## 2012-10-27 DIAGNOSIS — R109 Unspecified abdominal pain: Secondary | ICD-10-CM

## 2012-10-27 LAB — CBC WITH DIFFERENTIAL/PLATELET
Basophils Relative: 0 % (ref 0–1)
Eosinophils Absolute: 0 10*3/uL (ref 0.0–0.7)
HCT: 42.3 % (ref 39.0–52.0)
Hemoglobin: 15.1 g/dL (ref 13.0–17.0)
MCH: 29.6 pg (ref 26.0–34.0)
MCHC: 35.7 g/dL (ref 30.0–36.0)
MCV: 82.9 fL (ref 78.0–100.0)
Monocytes Absolute: 0.7 10*3/uL (ref 0.1–1.0)
Monocytes Relative: 6 % (ref 3–12)

## 2012-10-27 LAB — URINALYSIS, ROUTINE W REFLEX MICROSCOPIC
Ketones, ur: NEGATIVE mg/dL
Leukocytes, UA: NEGATIVE
Nitrite: NEGATIVE
Protein, ur: NEGATIVE mg/dL

## 2012-10-27 LAB — COMPREHENSIVE METABOLIC PANEL
AST: 18 U/L (ref 0–37)
CO2: 24 mEq/L (ref 19–32)
Calcium: 10 mg/dL (ref 8.4–10.5)
Creatinine, Ser: 1 mg/dL (ref 0.50–1.35)
GFR calc non Af Amer: >90 mL/min (ref >90)

## 2012-10-27 MED ORDER — ONDANSETRON HCL 4 MG/2ML IJ SOLN
4.0000 mg | Freq: Once | INTRAMUSCULAR | Status: AC
  Administered 2012-10-27: 4 mg via INTRAVENOUS
  Filled 2012-10-27: qty 2

## 2012-10-27 MED ORDER — HYDROMORPHONE HCL PF 1 MG/ML IJ SOLN
1.0000 mg | Freq: Once | INTRAMUSCULAR | Status: AC
  Administered 2012-10-27: 1 mg via INTRAVENOUS
  Filled 2012-10-27: qty 1

## 2012-10-27 MED ORDER — POTASSIUM CHLORIDE CRYS ER 20 MEQ PO TBCR
20.0000 meq | EXTENDED_RELEASE_TABLET | Freq: Once | ORAL | Status: AC
  Administered 2012-10-27: 20 meq via ORAL
  Filled 2012-10-27: qty 1

## 2012-10-27 MED ORDER — SODIUM CHLORIDE 0.9 % IV BOLUS (SEPSIS)
1000.0000 mL | Freq: Once | INTRAVENOUS | Status: AC
  Administered 2012-10-27: 1000 mL via INTRAVENOUS

## 2012-10-27 NOTE — ED Notes (Signed)
Pt c/o diffuse abd pain with n/v/d x 2 days

## 2012-10-27 NOTE — ED Provider Notes (Signed)
History     CSN: [id number]  Arrival date & time 10/27/12  1702   First MD Initiated Contact with Patient 10/27/12 1717      Chief Complaint  Patient presents with  . Abdominal Pain    (Consider location/radiation/quality/duration/timing/severity/associated sxs/prior treatment) HPI Comments: Pt states that he had diarrhea yesterday and vomiting and diarrhea today:pt unsure if there was blood in stool:pt states that he hasn't had a problem with his crohns in the last couple of years:pt   Patient is a 32 y.o. male presenting with abdominal pain. The history is provided by the patient. No language interpreter was used.  Abdominal Pain Pain location:  Generalized Pain quality: aching   Pain radiates to:  Back Pain severity:  Moderate Onset quality:  Sudden Duration:  2 days Timing:  Constant Progression:  Unchanged Chronicity:  Recurrent Context: previous surgery   Relieved by:  Nothing Worsened by:  Nothing tried Ineffective treatments:  None tried Associated symptoms: diarrhea and vomiting   Associated symptoms: no constipation, no fever and no nausea     Past Medical History  Diagnosis Date  . Crohn's disease   . Hypertension   . Allergy   . Renal disorder     Nephrolithiasis  . Heroin addiction 06/23/2009    Remission since 2011.  Marland Kitchen Pancreatitis     Past Surgical History  Procedure Laterality Date  . Bowel resection    . Cholecystectomy    . Tonsillectomy    . Appendectomy    . Colon surgery      History reviewed. No pertinent family history.  History  Substance Use Topics  . Smoking status: Current Every Day Smoker -- 1.50 packs/day  . Smokeless tobacco: Never Used  . Alcohol Use: Yes     Comment: occasional      Review of Systems  Constitutional: Negative for fever.  Respiratory: Negative.   Cardiovascular: Negative.   Gastrointestinal: Positive for vomiting, abdominal pain and diarrhea. Negative for nausea and constipation.    Allergies   Prednisone; Morphine and related; and Vicodin  Home Medications   Current Outpatient Rx  Name  Route  Sig  Dispense  Refill  . ibuprofen (ADVIL,MOTRIN) 200 MG tablet   Oral   Take 800 mg by mouth every 6 (six) hours as needed for pain.         Marland Kitchen lisinopril (PRINIVIL,ZESTRIL) 20 MG tablet   Oral   Take 1 tablet (20 mg total) by mouth daily.   30 tablet   5   . omeprazole (PRILOSEC) 20 MG capsule   Oral   Take 1 capsule (20 mg total) by mouth 2 (two) times daily.   60 capsule   2   . promethazine (PHENERGAN) 25 MG tablet   Oral   Take 1 tablet (25 mg total) by mouth every 6 (six) hours as needed for nausea.   20 tablet   0   . traMADol (ULTRAM) 50 MG tablet   Oral   Take 1 tablet (50 mg total) by mouth every 6 (six) hours as needed for pain.   20 tablet   0     BP 152/90  Pulse 96  Temp(Src) 98.4 F (36.9 C) (Oral)  Resp 16  Ht 6' (1.829 m)  Wt 175 lb (79.379 kg)  BMI 23.73 kg/m2  SpO2 100%  Physical Exam  Nursing note and vitals reviewed. Constitutional: He is oriented to person, place, and time. He appears well-developed and well-nourished.  HENT:  Head: Atraumatic.  Eyes: Conjunctivae and EOM are normal.  Neck: Normal range of motion.  Cardiovascular: Normal rate and regular rhythm.   Pulmonary/Chest: Effort normal and breath sounds normal.  Abdominal: Soft. Bowel sounds are normal.  Genitourinary:  Stool color in normal  Musculoskeletal: Normal range of motion.  Neurological: He is alert and oriented to person, place, and time.  Skin: Skin is warm and dry.  Psychiatric: He has a normal mood and affect.    ED Course  Procedures (including critical care time)  Labs Reviewed  COMPREHENSIVE METABOLIC PANEL - Abnormal; Notable for the following:    Potassium 3.2 (*)    All other components within normal limits  CBC WITH DIFFERENTIAL - Abnormal; Notable for the following:    WBC 11.5 (*)    Neutro Abs 8.6 (*)    All other components within  normal limits  URINALYSIS, ROUTINE W REFLEX MICROSCOPIC  LIPASE, BLOOD  OCCULT BLOOD X 1 CARD TO LAB, STOOL   No results found.   1. Vomiting and diarrhea   2. Abdominal pain       MDM  Pt feeling better at this time:pt is tolerating po;don't think pt needs to have a ct at this time as pt has had 4 in the last year:pt states that he has antiemetic at home        Glendell Docker, NP 10/27/12 [phone]

## 2012-10-28 NOTE — ED Provider Notes (Signed)
Medical screening examination/treatment/procedure(s) were performed by non-physician practitioner and as supervising physician I was immediately available for consultation/collaboration.  Veryl Speak, MD 10/28/12 [phone]

## 2013-01-11 ENCOUNTER — Other Ambulatory Visit: Payer: Self-pay | Admitting: Physician Assistant

## 2013-03-12 DIAGNOSIS — K219 Gastro-esophageal reflux disease without esophagitis: Secondary | ICD-10-CM | POA: Insufficient documentation

## 2013-03-16 ENCOUNTER — Telehealth: Payer: Self-pay | Admitting: *Deleted

## 2013-03-16 NOTE — Telephone Encounter (Signed)
Pt pharmacy sent prior auth over for omeprazole.  Ins denied and stated he must try over the counter meds first.  Pt notified and he will make an appt with Dr. Tamala Julian and call his ins.

## 2013-03-21 ENCOUNTER — Other Ambulatory Visit: Payer: Self-pay

## 2013-03-21 MED ORDER — OMEPRAZOLE 20 MG PO CPDR: DELAYED_RELEASE_CAPSULE | ORAL | Status: DC

## 2013-03-28 ENCOUNTER — Encounter: Payer: Self-pay | Admitting: Family Medicine

## 2013-03-28 ENCOUNTER — Ambulatory Visit (INDEPENDENT_AMBULATORY_CARE_PROVIDER_SITE_OTHER): Payer: 59 | Admitting: Family Medicine

## 2013-03-28 VITALS — BP 150/108 | HR 74 | Temp 98.6°F | Resp 16 | Ht 72.0 in | Wt 180.0 lb

## 2013-03-28 DIAGNOSIS — I10 Essential (primary) hypertension: Secondary | ICD-10-CM

## 2013-03-28 DIAGNOSIS — K029 Dental caries, unspecified: Secondary | ICD-10-CM

## 2013-03-28 DIAGNOSIS — K5090 Crohn's disease, unspecified, without complications: Secondary | ICD-10-CM

## 2013-03-28 DIAGNOSIS — J309 Allergic rhinitis, unspecified: Secondary | ICD-10-CM

## 2013-03-28 DIAGNOSIS — Z23 Encounter for immunization: Secondary | ICD-10-CM

## 2013-03-28 DIAGNOSIS — K219 Gastro-esophageal reflux disease without esophagitis: Secondary | ICD-10-CM

## 2013-03-28 LAB — COMPREHENSIVE METABOLIC PANEL
AST: 15 U/L (ref 0–37)
Alkaline Phosphatase: 97 U/L (ref 39–117)
BUN: 12 mg/dL (ref 6–23)
Calcium: 9.5 mg/dL (ref 8.4–10.5)
Chloride: 107 mEq/L (ref 96–112)
Creat: 0.89 mg/dL (ref 0.50–1.35)
Glucose, Bld: 85 mg/dL (ref 70–99)

## 2013-03-28 LAB — CBC
HCT: 41.6 % (ref 39.0–52.0)
Hemoglobin: 14.5 g/dL (ref 13.0–17.0)
MCHC: 34.9 g/dL (ref 30.0–36.0)
MCV: 83.2 fL (ref 78.0–100.0)
RDW: 13.7 % (ref 11.5–15.5)

## 2013-03-28 MED ORDER — LISINOPRIL-HYDROCHLOROTHIAZIDE 20-12.5 MG PO TABS: 1.0000 | ORAL_TABLET | Freq: Every day | ORAL | Status: DC

## 2013-03-28 MED ORDER — FLUTICASONE PROPIONATE 50 MCG/ACT NA SUSP: 2.0000 | Freq: Every day | NASAL | Status: DC

## 2013-03-28 NOTE — Progress Notes (Signed)
[place], [place]  [address]   [phone]  Subjective:    Patient ID: Leroy Herrera, male    DOB: December 27, 1980, 32 y.o.   MRN: [id number]  HPI This 32 y.o. male presents for nine month follow-up of the following:  "Doing all right." 1. GERD:  Insurance denied omeprazole. Tried to take 1 a day, didn't control symptoms. Occasionally uses zantac at night.  2. HTN:  Taking BP medicine every day. Occasionally takes BP at home. Tried amlodipine in past, had side effects. Tobacco use up to 2 ppd. Not trying to quit currently, but is interested in looking at [facility] smoking cessation program- wife and daughter want him to quit. Got teeth fixed in April; wasn't as bad as he thought it would be. Had all teeth extracted under anesthesia. Wears upper and lower dentures.  Work very physical. More so than at [employer]. Likes money, but it is very hard on his body and he works long hours. Back starting to bother him more. He is hoping to transition to doing paperwork/admin.  Does not see GI regularly.  Wife had miscarriage (second) last week. Sees daughter frequently. "Everything going good."   3.  Had flu shot today.  4.  Allergic Rhinitis: requesting refill of Flonase; staying congested daily.     Review of Systems  Constitutional: Negative for fever, chills, diaphoresis and fatigue.  HENT: Positive for congestion. Negative for ear pain, sore throat, rhinorrhea, trouble swallowing and voice change.   Respiratory: Negative for shortness of breath, wheezing and stridor.   Cardiovascular: Negative for chest pain, palpitations and leg swelling.  Gastrointestinal: Positive for constipation. Negative for nausea, vomiting, abdominal pain and diarrhea.  Musculoskeletal: Positive for myalgias and back pain. Negative for joint swelling, arthralgias and gait problem.  Skin: Negative for rash.  Neurological: Negative for dizziness, tremors, seizures, syncope, facial asymmetry, speech difficulty,  weakness, light-headedness, numbness and headaches.  Psychiatric/Behavioral: Negative for self-injury and dysphoric mood. The patient is not nervous/anxious.    No CP, no headache, no SOB, morning cough, no swelling. Occasional constipation.   Past Medical History  Diagnosis Date  . Crohn's disease   . Hypertension   . Allergy   . Renal disorder     Nephrolithiasis  . Heroin addiction 06/23/2009    Remission since 2011.  Marland Kitchen Pancreatitis    Past Surgical History  Procedure Laterality Date  . Bowel resection    . Cholecystectomy    . Tonsillectomy    . Appendectomy    . Colon surgery     Allergies  Allergen Reactions  . Prednisone Anaphylaxis    High dose allergy  . Morphine And Related Hives  . Vicodin [Hydrocodone-Acetaminophen] Hives and Itching   Current Outpatient Prescriptions on File Prior to Visit  Medication Sig Dispense Refill  . ibuprofen (ADVIL,MOTRIN) 200 MG tablet Take 800 mg by mouth every 6 (six) hours as needed for pain.      Marland Kitchen lisinopril (PRINIVIL,ZESTRIL) 20 MG tablet Take 1 tablet (20 mg total) by mouth daily.  30 tablet  5  . omeprazole (PRILOSEC) 20 MG capsule TAKE ONE CAPSULE BY MOUTH TWICE A DAY  180 capsule  0  . promethazine (PHENERGAN) 25 MG tablet Take 1 tablet (25 mg total) by mouth every 6 (six) hours as needed for nausea.  20 tablet  0  . traMADol (ULTRAM) 50 MG tablet Take 1 tablet (50 mg total) by mouth every 6 (six) hours as needed for  pain.  20 tablet  0   No current facility-administered medications on file prior to visit.   History   Social History  . Marital Status: Married    Spouse Name: N/A    Number of Children: N/A  . Years of Education: N/A   Occupational History  . Not on file.   Social History Main Topics  . Smoking status: Current Every Day Smoker -- 1.50 packs/day  . Smokeless tobacco: Never Used  . Alcohol Use: Yes     Comment: occasional  . Drug Use: No     Comment: PREVIOUS HEROIN ADDICTION 2011; RECOVERY SINCE  2011.  Marland Kitchen Sexual Activity: Yes   Other Topics Concern  . Not on file   Social History Narrative   Marital status: Married x 1 year; second marriage. Divorced from first marriage due to heroin addiction.      Children: one daughter who lives with mother.      Lives: with wife.      Employment: auto repairs      Tobacco:  1.5 ppd      Alcohol: weekends      Drugs: recovering heroin addict since 2011.  Narcotic abuse/overuse/misuse in past.      Exercise: none           Objective:   Physical Exam  Nursing note and vitals reviewed. Constitutional: He is oriented to person, place, and time. He appears well-developed and well-nourished. No distress.  HENT:  Head: Normocephalic and atraumatic.  Mouth/Throat: Oropharynx is clear and moist.  Eyes: Conjunctivae and EOM are normal. Pupils are equal, round, and reactive to light.  Neck: Normal range of motion. Neck supple. No thyromegaly present.  Cardiovascular: Normal rate, regular rhythm, normal heart sounds and intact distal pulses.  Exam reveals no gallop and no friction rub.   No murmur heard. Pulmonary/Chest: Effort normal and breath sounds normal. He has no wheezes. He has no rales.  Abdominal: Soft. Bowel sounds are normal. He exhibits no distension and no mass. There is no tenderness. There is no rebound and no guarding.  Musculoskeletal:       Lumbar back: Normal.  Lymphadenopathy:    He has no cervical adenopathy.  Neurological: He is alert and oriented to person, place, and time.  Skin: Skin is warm and dry. He is not diaphoretic.  Psychiatric: He has a normal mood and affect. His behavior is normal. Judgment and thought content normal.   INFLUENZA VACCINE ADMINISTERED.    Assessment & Plan:  Need for prophylactic vaccination and inoculation against influenza - Plan: Flu Vaccine QUAD 36+ mos IM, CBC, Comprehensive metabolic panel, TSH  Essential hypertension, benign - Plan: CBC, Comprehensive metabolic panel,  TSH  Allergic rhinitis, cause unspecified  Crohn's disease  Dental caries  GERD (gastroesophageal reflux disease)   1.  HTN: uncontrolled; change to Lisinopril/HCTZ 20/12.5 daily.  Obtain labs; follow-up in two months. 2.  Allergic Rhinitis: uncontrolled; rx for Flonase provided. 3.  GERD: controlled moderately with bid Omeprazole; using Ranitidine qhs PRN. 4.  Crohn's disease: stable; no recent issues in past six months.  No recent follow-up with GI. 5.  Dental caries: improved; s/p complete teeth extraction with dentures. 6. S/p flu vaccine.  Meds ordered this encounter  Medications  . lisinopril-hydrochlorothiazide (PRINZIDE,ZESTORETIC) 20-12.5 MG per tablet    Sig: Take 1 tablet by mouth daily.    Dispense:  30 tablet    Refill:  5  . fluticasone (FLONASE) 50 MCG/ACT nasal spray  Sig: Place 2 sprays into the nose daily.    Dispense:  16 g    Refill:  6

## 2013-04-01 NOTE — Progress Notes (Signed)
PA approved for omeprazole through 03/30/14. Pharm notified.

## 2013-05-30 ENCOUNTER — Ambulatory Visit: Payer: 59 | Admitting: Family Medicine

## 2013-06-13 ENCOUNTER — Encounter: Payer: Self-pay | Admitting: Family Medicine

## 2013-06-13 ENCOUNTER — Ambulatory Visit (INDEPENDENT_AMBULATORY_CARE_PROVIDER_SITE_OTHER): Payer: 59 | Admitting: Family Medicine

## 2013-06-13 VITALS — BP 136/84 | HR 93 | Temp 98.5°F | Resp 16 | Ht 72.0 in | Wt 178.4 lb

## 2013-06-13 DIAGNOSIS — Z716 Tobacco abuse counseling: Secondary | ICD-10-CM

## 2013-06-13 DIAGNOSIS — Z7189 Other specified counseling: Secondary | ICD-10-CM

## 2013-06-13 DIAGNOSIS — I10 Essential (primary) hypertension: Secondary | ICD-10-CM

## 2013-06-13 DIAGNOSIS — R51 Headache: Secondary | ICD-10-CM

## 2013-06-13 DIAGNOSIS — R519 Headache, unspecified: Secondary | ICD-10-CM

## 2013-06-13 LAB — COMPLETE METABOLIC PANEL WITH GFR
AST: 18 U/L (ref 0–37)
Albumin: 4.5 g/dL (ref 3.5–5.2)
BUN: 12 mg/dL (ref 6–23)
Calcium: 9.2 mg/dL (ref 8.4–10.5)
Chloride: 104 mEq/L (ref 96–112)
Potassium: 3.4 mEq/L — ABNORMAL LOW (ref 3.5–5.3)
Sodium: 138 mEq/L (ref 135–145)
Total Protein: 6.5 g/dL (ref 6.0–8.3)

## 2013-06-13 MED ORDER — LISINOPRIL-HYDROCHLOROTHIAZIDE 20-25 MG PO TABS: 1.0000 | ORAL_TABLET | Freq: Every day | ORAL | Status: DC

## 2013-06-13 NOTE — Progress Notes (Signed)
Subjective:   This chart was scribed for Leroy Honour, MD by Leroy Herrera, ED Scribe. This patient was seen in room Room 22 and the patient's care was started 4:25 PM.    Patient ID: Leroy Herrera, male    DOB: 05/22/1981, 32 y.o.   MRN: [id number]  HPI  HPI Comments: Leroy Herrera is a 32 y.o. male with a h/o crohn's disease, HTN, and renal disorder who presents to [facility] seeking follow up for his hypertension today. Pt states the changes to his BP medication are effective. He says his BP is now running normally. He denies any side effects. He denies any sexual side effects. He denies ever getting 440'N for his systolic pressure.   Pt states his stomach is doing okay, and is taking something OTC regularly. He denies recently switching insurance companies.   He reports intermittent soreness to corner of his lips bilaterally, which is says it baseline for Herrera for the last couple of years. He states he has been taking a multivitamin for the last 6 months and has not noticed a change.   Emotionally he says he is doing fine. He says work is going well. He plans to stop smoking next year. He says he currently smokes 2 packs of cigarettes a day. He reports getting a Flu vaccination this year.  He reports severe intermittent left sided HAs associated with photophobia and nausea onset early summer 2014. He says he experiences them once every month to 2 months. When the episodes come, he says they are extremely severe. He says his last episode was last night, and says he is experiencing soreness to his head today. He says he tried Excedrin Migraine with no relief. He reports a family h/o Migraines. He denies a family h/o of aneurysms. He denies visual disturbances, blurred vision, numbness, tingling, or dizziness when the episodes occur.  Pt states his wife recently had 2 miscarriages and is currently pregnant again.  Past Medical History  Diagnosis Date  . Crohn's disease   . Hypertension     . Allergy   . Renal disorder     Nephrolithiasis  . Heroin addiction 06/23/2009    Remission since 2011.  Marland Kitchen Pancreatitis     History   Social History  . Marital Status: Married    Spouse Name: N/A    Number of Children: N/A  . Years of Education: N/A   Occupational History  . Not on file.   Social History Main Topics  . Smoking status: Current Every Day Smoker -- 1.50 packs/day  . Smokeless tobacco: Never Used  . Alcohol Use: Yes     Comment: occasional  . Drug Use: No     Comment: PREVIOUS HEROIN ADDICTION 2011; RECOVERY SINCE 2011.  Marland Kitchen Sexual Activity: Yes   Other Topics Concern  . Not on file   Social History Narrative   Marital status: Married x 1 year; second marriage. Divorced from first marriage due to heroin addiction.      Children: one daughter who lives with mother.      Lives: with wife.      Employment: auto repairs      Tobacco:  1.5 ppd      Alcohol: weekends      Drugs: recovering heroin addict since 2011.  Narcotic abuse/overuse/misuse in past.      Exercise: none        Past Surgical History  Procedure Laterality Date  . Bowel resection    .  Cholecystectomy    . Tonsillectomy    . Appendectomy    . Colon surgery      Review of Systems  Constitutional: Negative for fever, chills, diaphoresis, activity change, appetite change and fatigue.  Eyes: Positive for photophobia. Negative for visual disturbance.  Respiratory: Negative for cough, shortness of breath, wheezing and stridor.   Cardiovascular: Negative for chest pain, palpitations and leg swelling.  Gastrointestinal: Negative for nausea, vomiting, abdominal pain, diarrhea, constipation, blood in stool, abdominal distention and rectal pain.  Neurological: Positive for headaches. Negative for dizziness, tremors, seizures, syncope, facial asymmetry, speech difficulty, weakness, light-headedness and numbness.  Psychiatric/Behavioral: Negative for dysphoric mood. The patient is not  nervous/anxious.   All other systems reviewed and are negative.       Objective:   Physical Exam  Nursing note and vitals reviewed. Constitutional: He is oriented to person, place, and time. He appears well-developed and well-nourished. No distress.  HENT:  Head: Normocephalic and atraumatic.  Right Ear: External ear normal.  Left Ear: External ear normal.  Nose: Nose normal.  Mouth/Throat: Oropharynx is clear and moist.  Eyes: Conjunctivae and EOM are normal. Pupils are equal, round, and reactive to light.  Neck: Normal range of motion. Neck supple. Carotid bruit is not present. No thyromegaly present.  Cardiovascular: Normal rate, regular rhythm, normal heart sounds and intact distal pulses.  Exam reveals no gallop and no friction rub.   No murmur heard. Pulmonary/Chest: Effort normal and breath sounds normal. No respiratory distress. He has no wheezes. He has no rales.  Abdominal: Soft. Bowel sounds are normal. He exhibits no distension and no mass. There is no tenderness. There is no rebound and no guarding.  Musculoskeletal: Normal range of motion.  Lymphadenopathy:    He has no cervical adenopathy.  Neurological: He is alert and oriented to person, place, and time. No cranial nerve deficit. He exhibits normal muscle tone. Coordination normal.  Skin: Skin is warm and dry. No rash noted. He is not diaphoretic.  Psychiatric: He has a normal mood and affect. His behavior is normal. Judgment normal.       Assessment & Plan:   Essential hypertension, benign - Plan: COMPLETE METABOLIC PANEL WITH GFR, DISCONTINUED: lisinopril-hydrochlorothiazide (PRINZIDE,ZESTORETIC) 20-25 MG per tablet  Frequent headaches - Plan: CANCELED: MR MRA Head/Brain Wo Cm, CANCELED: MR Brain W Wo Contrast  Tobacco abuse counseling  1. HTN: controlled; obtain labs; continue current medications. 2.  Headaches: new onset. Suggestive of migraines however unusual to start in thirties; obtain MRI brain to  rule out aneurysm or intracranial process. Normal neurological exam in office.   3. Tobacco abuse: contemplative at this time; encourage cessation.  Pt declined rx at this time.  Meds ordered this encounter  Medications  . DISCONTD: lisinopril-hydrochlorothiazide (PRINZIDE,ZESTORETIC) 20-25 MG per tablet    Sig: Take 1 tablet by mouth daily.    Dispense:  30 tablet    Refill:  5   I personally performed the services described in this documentation, which was scribed in my presence.  The recorded information has been reviewed and is accurate.  Reginia Forts, M.D.  [facility] [place] [address] [place], [place]  [address] [phone] phone [phone] fax

## 2013-06-21 ENCOUNTER — Telehealth: Payer: Self-pay

## 2013-06-21 ENCOUNTER — Telehealth: Payer: Self-pay | Admitting: Radiology

## 2013-06-21 ENCOUNTER — Emergency Department (HOSPITAL_BASED_OUTPATIENT_CLINIC_OR_DEPARTMENT_OTHER)
Admission: EM | Admit: 2013-06-21 | Discharge: 2013-06-21 | Disposition: A | Discharge status: Home / Self Care | Payer: 59 | Attending: Emergency Medicine | Admitting: Emergency Medicine

## 2013-06-21 ENCOUNTER — Encounter (HOSPITAL_BASED_OUTPATIENT_CLINIC_OR_DEPARTMENT_OTHER): Payer: Self-pay | Admitting: Emergency Medicine

## 2013-06-21 DIAGNOSIS — K5900 Constipation, unspecified: Secondary | ICD-10-CM | POA: Insufficient documentation

## 2013-06-21 DIAGNOSIS — K5090 Crohn's disease, unspecified, without complications: Secondary | ICD-10-CM | POA: Insufficient documentation

## 2013-06-21 DIAGNOSIS — Z87442 Personal history of urinary calculi: Secondary | ICD-10-CM | POA: Insufficient documentation

## 2013-06-21 DIAGNOSIS — Z79899 Other long term (current) drug therapy: Secondary | ICD-10-CM | POA: Insufficient documentation

## 2013-06-21 DIAGNOSIS — Z9089 Acquired absence of other organs: Secondary | ICD-10-CM | POA: Insufficient documentation

## 2013-06-21 DIAGNOSIS — F17200 Nicotine dependence, unspecified, uncomplicated: Secondary | ICD-10-CM | POA: Insufficient documentation

## 2013-06-21 DIAGNOSIS — Z9889 Other specified postprocedural states: Secondary | ICD-10-CM | POA: Insufficient documentation

## 2013-06-21 DIAGNOSIS — Z8659 Personal history of other mental and behavioral disorders: Secondary | ICD-10-CM | POA: Insufficient documentation

## 2013-06-21 DIAGNOSIS — I10 Essential (primary) hypertension: Secondary | ICD-10-CM | POA: Insufficient documentation

## 2013-06-21 DIAGNOSIS — IMO0002 Reserved for concepts with insufficient information to code with codable children: Secondary | ICD-10-CM | POA: Insufficient documentation

## 2013-06-21 LAB — CBC WITH DIFFERENTIAL/PLATELET
Basophils Absolute: 0.1 10*3/uL (ref 0.0–0.1)
Basophils Relative: 0 % (ref 0–1)
Eosinophils Absolute: 0.1 10*3/uL (ref 0.0–0.7)
Eosinophils Relative: 0 % (ref 0–5)
HCT: 48.8 % (ref 39.0–52.0)
MCH: 29.4 pg (ref 26.0–34.0)
MCHC: 35 g/dL (ref 30.0–36.0)
MCV: 84 fL (ref 78.0–100.0)
Monocytes Absolute: 1 10*3/uL (ref 0.1–1.0)
Platelets: 247 10*3/uL (ref 150–400)
RDW: 12.7 % (ref 11.5–15.5)
WBC: 15.7 10*3/uL — ABNORMAL HIGH (ref 4.0–10.5)

## 2013-06-21 LAB — URINALYSIS, ROUTINE W REFLEX MICROSCOPIC
Bilirubin Urine: NEGATIVE
Hgb urine dipstick: NEGATIVE
Nitrite: NEGATIVE
Specific Gravity, Urine: 1.018 (ref 1.005–1.030)
Urobilinogen, UA: 0.2 mg/dL (ref 0.0–1.0)
pH: 7 (ref 5.0–8.0)

## 2013-06-21 LAB — COMPREHENSIVE METABOLIC PANEL
ALT: 76 U/L — ABNORMAL HIGH (ref 0–53)
AST: 32 U/L (ref 0–37)
Calcium: 10.3 mg/dL (ref 8.4–10.5)
Creatinine, Ser: 1 mg/dL (ref 0.50–1.35)
GFR calc non Af Amer: >90 mL/min (ref >90)
Sodium: 136 mEq/L — ABNORMAL LOW (ref 137–147)
Total Protein: 8.2 g/dL (ref 6.0–8.3)

## 2013-06-21 LAB — LIPASE, BLOOD: Lipase: 46 U/L (ref 11–59)

## 2013-06-21 MED ORDER — PREDNISONE 20 MG PO TABS
20.0000 mg | ORAL_TABLET | Freq: Once | ORAL | Status: AC
  Administered 2013-06-21: 20 mg via ORAL

## 2013-06-21 MED ORDER — SODIUM CHLORIDE 0.9 % IV BOLUS (SEPSIS)
1000.0000 mL | Freq: Once | INTRAVENOUS | Status: AC
  Administered 2013-06-21: 1000 mL via INTRAVENOUS

## 2013-06-21 MED ORDER — PREDNISONE 20 MG PO TABS
ORAL_TABLET | ORAL | Status: AC
  Filled 2013-06-21: qty 1

## 2013-06-21 MED ORDER — HYDROMORPHONE HCL PF 1 MG/ML IJ SOLN
1.0000 mg | Freq: Once | INTRAMUSCULAR | Status: AC
  Administered 2013-06-21: 1 mg via INTRAVENOUS
  Filled 2013-06-21: qty 1

## 2013-06-21 MED ORDER — OXYCODONE-ACETAMINOPHEN 5-325 MG PO TABS: 1.0000 | ORAL_TABLET | Freq: Four times a day (QID) | ORAL | Status: DC | PRN

## 2013-06-21 MED ORDER — PREDNISONE 20 MG PO TABS: 20.0000 mg | ORAL_TABLET | Freq: Every day | ORAL | Status: DC

## 2013-06-21 MED ORDER — ONDANSETRON HCL 4 MG/2ML IJ SOLN
4.0000 mg | Freq: Once | INTRAMUSCULAR | Status: AC
  Administered 2013-06-21: 4 mg via INTRAVENOUS
  Filled 2013-06-21: qty 2

## 2013-06-21 NOTE — ED Notes (Signed)
Upper abd pain, vomiting this morning

## 2013-06-21 NOTE — ED Provider Notes (Signed)
CSN: [id number]     Arrival date & time 06/21/13  5462 History   First MD Initiated Contact with Patient 06/21/13 0919     Chief Complaint  Patient presents with  . Abdominal Pain   (Consider location/radiation/quality/duration/timing/severity/associated sxs/prior Treatment) Patient is a 32 y.o. male presenting with abdominal pain.  Abdominal Pain  Pt with hsitory of crohn's disease who is s/p ilectomy, appendectomy and cholecystectomy has been doing well until last night when he began to have moderate aching upper abdominal pain, waxing and waning, associated with vomiting. He had been constipated lately, but had good results with enema done yesterday. No blood in stool. No blood in vomit. No fever.   Past Medical History  Diagnosis Date  . Crohn's disease   . Hypertension   . Allergy   . Renal disorder     Nephrolithiasis  . Heroin addiction 06/23/2009    Remission since 2011.  Marland Kitchen Pancreatitis    Past Surgical History  Procedure Laterality Date  . Bowel resection    . Cholecystectomy    . Tonsillectomy    . Appendectomy    . Colon surgery     No family history on file. History  Substance Use Topics  . Smoking status: Current Every Day Smoker -- 1.50 packs/day  . Smokeless tobacco: Never Used  . Alcohol Use: Yes     Comment: occasional    Review of Systems  Gastrointestinal: Positive for abdominal pain.   All other systems reviewed and are negative except as noted in HPI.   Allergies  Prednisone; Morphine and related; and Vicodin  Home Medications   Current Outpatient Rx  Name  Route  Sig  Dispense  Refill  . fluticasone (FLONASE) 50 MCG/ACT nasal spray   Nasal   Place 2 sprays into the nose daily.   16 g   6   . ibuprofen (ADVIL,MOTRIN) 200 MG tablet   Oral   Take 800 mg by mouth every 6 (six) hours as needed for pain.         Marland Kitchen lisinopril (PRINIVIL,ZESTRIL) 20 MG tablet   Oral   Take 1 tablet (20 mg total) by mouth daily.   30 tablet   5   .  lisinopril-hydrochlorothiazide (PRINZIDE,ZESTORETIC) 20-25 MG per tablet   Oral   Take 1 tablet by mouth daily.   30 tablet   5   . omeprazole (PRILOSEC) 20 MG capsule      TAKE ONE CAPSULE BY MOUTH TWICE A DAY   180 capsule   0   . promethazine (PHENERGAN) 25 MG tablet   Oral   Take 1 tablet (25 mg total) by mouth every 6 (six) hours as needed for nausea.   20 tablet   0   . traMADol (ULTRAM) 50 MG tablet   Oral   Take 1 tablet (50 mg total) by mouth every 6 (six) hours as needed for pain.   20 tablet   0    BP 152/107  Pulse 114  Temp(Src) 98.2 F (36.8 C) (Oral)  Resp 20  Ht 6' (1.829 m)  Wt 180 lb (81.647 kg)  BMI 24.41 kg/m2  SpO2 100% Physical Exam  Nursing note and vitals reviewed. Constitutional: He is oriented to person, place, and time. He appears well-developed and well-nourished.  HENT:  Head: Normocephalic and atraumatic.  Eyes: EOM are normal. Pupils are equal, round, and reactive to light.  Neck: Normal range of motion. Neck supple.  Cardiovascular: Normal rate, normal heart  sounds and intact distal pulses.   Pulmonary/Chest: Effort normal and breath sounds normal.  Abdominal: Bowel sounds are normal. He exhibits no distension. There is tenderness (RLQ, epigastric). There is no rebound and no guarding.  Musculoskeletal: Normal range of motion. He exhibits no edema and no tenderness.  Neurological: He is alert and oriented to person, place, and time. He has normal strength. No cranial nerve deficit or sensory deficit.  Skin: Skin is warm and dry. No rash noted.  Psychiatric: He has a normal mood and affect.    ED Course  Procedures (including critical care time) Labs Review Labs Reviewed  CBC WITH DIFFERENTIAL - Abnormal; Notable for the following:    WBC 15.7 (*)    Hemoglobin 17.1 (*)    Neutrophils Relative % 81 (*)    Neutro Abs 12.8 (*)    All other components within normal limits  COMPREHENSIVE METABOLIC PANEL  URINALYSIS, ROUTINE W  REFLEX MICROSCOPIC  LIPASE, BLOOD   Imaging Review No results found.  EKG Interpretation   None       MDM   1. Exacerbation of Crohn's disease     Pt is afebrile, s/o appendectomy. RLQ pain in setting of Crohn's but has had numerous prior CTs of abdomen. Will check labs and treat symptomatically.   10:40 AM Mild leukocytosis but otherwise unremarkable labs. Pt reports allergy to Prednisone but only in 'high dose'. Has taken Prednisone for years <9m without any issues. Will give 224mPO and monitor for signs of reaction in the ED prior to discharge.   Charles B. ShKarle StarchMD 06/21/13 [phone]

## 2013-06-21 NOTE — Telephone Encounter (Signed)
Peer to peer needed by 06/23/13 for pt's MRA Head w/o contrast. Case # [id number], ID #[id number]. Fax is in the PA box if needed.

## 2013-06-21 NOTE — Telephone Encounter (Signed)
MRI /MRA Needs peer to peer discussion, case # [id number]  Phone number is [phone] option 3  Patient ID [id number]

## 2013-06-22 NOTE — Telephone Encounter (Signed)
This is why I sent request to Dr Tamala Julian yesterday.

## 2013-06-22 NOTE — Telephone Encounter (Signed)
I am unable to do a PA because the note has not been finished.  Please contact the insurance company and tell them the Dr Tamala Julian is out of town.

## 2013-06-30 NOTE — Telephone Encounter (Signed)
Dr Tamala Julian, you still have prev message concerning this precert in your in-basket. Have you done this or do I need to get back in touch w/ ins to see if it is still possible?

## 2013-07-01 ENCOUNTER — Telehealth: Payer: Self-pay | Admitting: Radiology

## 2013-07-01 DIAGNOSIS — R51 Headache: Principal | ICD-10-CM

## 2013-07-01 DIAGNOSIS — R519 Headache, unspecified: Secondary | ICD-10-CM

## 2013-07-01 NOTE — Telephone Encounter (Signed)
Spoke to Dr Tamala Julian about the MRI/MRA we can cancel the MRA and do the MRI scan, this is reordered for him. He is having headaches and needs MRI scan to evaluate

## 2013-07-05 NOTE — Telephone Encounter (Signed)
Changed order for scan. To MRI brain without this should not require precert.

## 2013-07-08 NOTE — Telephone Encounter (Signed)
Changed order to MRI only.

## 2013-09-12 ENCOUNTER — Ambulatory Visit: Payer: 59 | Admitting: Family Medicine

## 2013-10-18 ENCOUNTER — Ambulatory Visit (INDEPENDENT_AMBULATORY_CARE_PROVIDER_SITE_OTHER): Payer: 59 | Admitting: Family Medicine

## 2013-10-18 ENCOUNTER — Encounter: Payer: Self-pay | Admitting: Family Medicine

## 2013-10-18 VITALS — BP 110/76 | HR 66 | Temp 98.8°F | Resp 16 | Ht 69.5 in | Wt 176.2 lb

## 2013-10-18 DIAGNOSIS — K219 Gastro-esophageal reflux disease without esophagitis: Secondary | ICD-10-CM

## 2013-10-18 DIAGNOSIS — I10 Essential (primary) hypertension: Secondary | ICD-10-CM

## 2013-10-18 DIAGNOSIS — J309 Allergic rhinitis, unspecified: Secondary | ICD-10-CM

## 2013-10-18 DIAGNOSIS — G43909 Migraine, unspecified, not intractable, without status migrainosus: Secondary | ICD-10-CM

## 2013-10-18 LAB — CBC WITH DIFFERENTIAL/PLATELET
BASOS PCT: 1 % (ref 0–1)
Basophils Absolute: 0.1 10*3/uL (ref 0.0–0.1)
EOS ABS: 0.1 10*3/uL (ref 0.0–0.7)
EOS PCT: 1 % (ref 0–5)
HCT: 42.6 % (ref 39.0–52.0)
HEMOGLOBIN: 14.7 g/dL (ref 13.0–17.0)
Lymphocytes Relative: 21 % (ref 12–46)
Lymphs Abs: 1.7 10*3/uL (ref 0.7–4.0)
MCH: 29.5 pg (ref 26.0–34.0)
MCHC: 34.5 g/dL (ref 30.0–36.0)
MCV: 85.4 fL (ref 78.0–100.0)
MONO ABS: 0.8 10*3/uL (ref 0.1–1.0)
MONOS PCT: 10 % (ref 3–12)
NEUTROS PCT: 67 % (ref 43–77)
Neutro Abs: 5.4 10*3/uL (ref 1.7–7.7)
Platelets: 193 10*3/uL (ref 150–400)
RBC: 4.99 MIL/uL (ref 4.22–5.81)
RDW: 12.7 % (ref 11.5–15.5)
WBC: 8 10*3/uL (ref 4.0–10.5)

## 2013-10-18 LAB — COMPLETE METABOLIC PANEL WITH GFR
ALK PHOS: 102 U/L (ref 39–117)
ALT: 50 U/L (ref 0–53)
AST: 26 U/L (ref 0–37)
Albumin: 4.6 g/dL (ref 3.5–5.2)
BILIRUBIN TOTAL: 0.4 mg/dL (ref 0.2–1.2)
BUN: 13 mg/dL (ref 6–23)
CO2: 28 mEq/L (ref 19–32)
CREATININE: 1.07 mg/dL (ref 0.50–1.35)
Calcium: 9.6 mg/dL (ref 8.4–10.5)
Chloride: 100 mEq/L (ref 96–112)
GFR, Est African American: >89 mL/min
GLUCOSE: 86 mg/dL (ref 70–99)
Potassium: 4.3 mEq/L (ref 3.5–5.3)
SODIUM: 134 meq/L — AB (ref 135–145)
Total Protein: 6.6 g/dL (ref 6.0–8.3)

## 2013-10-18 MED ORDER — FLUTICASONE PROPIONATE 50 MCG/ACT NA SUSP: 2.0000 | Freq: Every day | NASAL | Status: DC

## 2013-10-18 MED ORDER — SUMATRIPTAN SUCCINATE 100 MG PO TABS: 100.0000 mg | ORAL_TABLET | ORAL | Status: DC | PRN

## 2013-10-18 MED ORDER — RABEPRAZOLE SODIUM 20 MG PO TBEC: 20.0000 mg | DELAYED_RELEASE_TABLET | Freq: Every day | ORAL | Status: DC

## 2013-10-18 NOTE — Progress Notes (Signed)
Subjective:    Patient ID: Leroy Herrera, male    DOB: 1981/02/06, 33 y.o.   MRN: [id number] This chart was scribed for Leroy Honour, MD by Anastasia Pall, ED Scribe. This patient was seen in room 24 and the patient's care was started at 11:05 AM.  10/18/2013  Follow-up and Medication Refill 3-4 month follow up of HTN. Increased Lisinopril-HTZ 20-25 mg. Last office visit 05/2013. He was complaining of headaches at that time. His wife is pregnant. Ordered MRI brain for the headaches but patient did not undergo MRI brain due to deductible.   Pt was in ED 06/21/13 for abdominal pain and vomiting. He received Prednisone for possible Crohn's disease exacerbation. ALT liver function test was elevated at ED visit. Pt has h/o smoking.   HPI ARBY Herrera is a 33 y.o. male who presents to the [facility] for a follow up of HTN.   Pt reports checking his BP at home, every morning, states it usually runs around 110s/80s. He reports at baseline he has occasional dizziness. He denies any symptoms out of the ordinary. He denies any side effects from his medication. He reports feeling good about his BP.   He reports not being able to go for his brain MRI due to not meeting deductible. He reports his headaches have been the same since last visit. He reports his headaches are mostly on his left side. He reports he will have residual soreness after headaches. He reports associated nausea with his headaches, states his symptoms can last 24 hours. He denies missing work. He reports after resting his symptoms will subside the next day. He denies the Excedrin Migraine giving him relief. He denies his headaches waking up at night. He denies numbness and tingling with his headaches. He reports associated intermittent dizziness with his headaches.   He reports intermittent sneezing due to his environmental allergies. He has been taking Flonase PRN. He reports his smoking has not decreased. He smokes 1-2 PPD. He reports  noticing bloody discharge from his nose, but denies true epistaxis. He reports he has started lighting weights, but denies doing cardio exercises due to smoking. He would like to cut back on smoking, and requests an appointment   He states his Omeprazole has not been successful. He states he has tried Protonix and Nexium a few years ago without relief.   With his ED visit, he states he was constipated. He hadn't had a bowel movement in several days. He reports taking laxatives around 5:00 pm. He reports waves of abdominal pain later that evening, that would debilitate him. He states his abdominal pain lasted 24 hours. He states he had a bowel movement the next day which produced enlarged stool. He states he thinks his pain was due to his bowels trying to push out the enlarged stool vs his h/o Crohn's disease.   He reports having to urinate once every night, onset 3-4 months ago, occasionally 2. He denies difficulty urinating, decreased urine output, dysuria. He denies a change in his caffeine consumption.   He denies abdominal pain, chest pain, and any other associated symptoms. He reports his wife is doing well with her pregnancy.  Review of Systems  Constitutional: Negative for fever, chills, diaphoresis, activity change, appetite change and fatigue.  HENT: Positive for congestion, postnasal drip, rhinorrhea and sneezing. Negative for ear pain, nosebleeds, sinus pressure and sore throat.   Eyes: Negative for visual disturbance.  Respiratory: Negative for cough and shortness of breath.  Cardiovascular: Negative for chest pain, palpitations and leg swelling.  Gastrointestinal: Positive for nausea (with headaches), abdominal pain (RLQ at baseline) and constipation. Negative for vomiting, diarrhea, blood in stool, anal bleeding and rectal pain.  Endocrine: Negative for cold intolerance, heat intolerance, polydipsia, polyphagia and polyuria.  Skin: Negative for rash.  Allergic/Immunologic: Positive  for environmental allergies.  Neurological: Positive for dizziness (with headaches) and headaches. Negative for tremors, seizures, syncope, facial asymmetry, speech difficulty, weakness, light-headedness and numbness.  Psychiatric/Behavioral: Negative for sleep disturbance and dysphoric mood. The patient is not nervous/anxious.     Past Medical History  Diagnosis Date  . Crohn's disease   . Hypertension   . Allergy   . Renal disorder     Nephrolithiasis  . Heroin addiction 06/23/2009    Remission since 2011.  Marland Kitchen Pancreatitis    Allergies  Allergen Reactions  . Prednisone Anaphylaxis    High dose allergy  . Morphine And Related Hives  . Vicodin [Hydrocodone-Acetaminophen] Hives and Itching   Current Outpatient Prescriptions  Medication Sig Dispense Refill  . fluticasone (FLONASE) 50 MCG/ACT nasal spray Place 2 sprays into both nostrils daily. 16 g 11  . dexlansoprazole (DEXILANT) 60 MG capsule Take 1 capsule (60 mg total) by mouth daily. 30 capsule 5  . ibuprofen (ADVIL,MOTRIN) 200 MG tablet Take 800 mg by mouth every 6 (six) hours as needed for pain.    Marland Kitchen lisinopril-hydrochlorothiazide (PRINZIDE,ZESTORETIC) 20-25 MG per tablet Take 1 tablet by mouth daily. 30 tablet 5  . lisinopril-hydrochlorothiazide (PRINZIDE,ZESTORETIC) 20-25 MG per tablet TAKE 1 TABLET BY MOUTH DAILY. 30 tablet 5  . omeprazole (PRILOSEC) 40 MG capsule Take 1 capsule (40 mg total) by mouth daily. 90 capsule 3  . oxyCODONE-acetaminophen (PERCOCET/ROXICET) 5-325 MG per tablet Take 1 tablet by mouth every 4 (four) hours as needed for moderate pain or severe pain. 10 tablet 0  . RABEprazole (ACIPHEX) 20 MG tablet Take 1 tablet (20 mg total) by mouth daily. 30 tablet 11  . SUMAtriptan (IMITREX) 100 MG tablet Take 1 tablet (100 mg total) by mouth every 2 (two) hours as needed for migraine or headache. May repeat in 2 hours if headache persists or recurs. 8 tablet 4   No current facility-administered medications for this  visit.       Objective:    BP 110/76  Pulse 66  Temp(Src) 98.8 F (37.1 C) (Oral)  Resp 16  Ht 5' 9.5" (1.765 m)  Wt 176 lb 3.2 oz (79.924 kg)  BMI 25.66 kg/m2  SpO2 99% Physical Exam  Constitutional: He is oriented to person, place, and time. He appears well-developed and well-nourished. No distress.  HENT:  Head: Normocephalic and atraumatic.  Right Ear: Tympanic membrane, external ear and ear canal normal.  Left Ear: Tympanic membrane, external ear and ear canal normal.  Nose: Nose normal.  Mouth/Throat: Uvula is midline, oropharynx is clear and moist and mucous membranes are normal. No oropharyngeal exudate or posterior oropharyngeal erythema.  Eyes: Conjunctivae and EOM are normal. Pupils are equal, round, and reactive to light.  Neck: Normal range of motion. Neck supple. Carotid bruit is not present. No thyromegaly present.  Cardiovascular: Normal rate, regular rhythm, normal heart sounds and intact distal pulses.  Exam reveals no gallop and no friction rub.   No murmur heard. Pulmonary/Chest: Effort normal and breath sounds normal. No respiratory distress. He has no wheezes. He has no rales. He exhibits no tenderness.  Abdominal: Soft. Bowel sounds are normal. He exhibits no distension and  no mass. There is no tenderness. There is no rebound and no guarding.  Musculoskeletal: Normal range of motion.  Lymphadenopathy:    He has no cervical adenopathy.  Neurological: He is alert and oriented to person, place, and time. No cranial nerve deficit. He exhibits normal muscle tone. Coordination normal.  Skin: Skin is warm and dry. No rash noted. He is not diaphoretic.  Psychiatric: He has a normal mood and affect. His behavior is normal. Judgment and thought content normal.  Nursing note and vitals reviewed.  Results for orders placed or performed in visit on 10/18/13  CBC with Differential  Result Value Ref Range   WBC 8.0 4.0 - 10.5 K/uL   RBC 4.99 4.22 - 5.81 MIL/uL    Hemoglobin 14.7 13.0 - 17.0 g/dL   HCT 42.6 39.0 - 52.0 %   MCV 85.4 78.0 - 100.0 fL   MCH 29.5 26.0 - 34.0 pg   MCHC 34.5 30.0 - 36.0 g/dL   RDW 12.7 11.5 - 15.5 %   Platelets 193 150 - 400 K/uL   Neutrophils Relative % 67 43 - 77 %   Neutro Abs 5.4 1.7 - 7.7 K/uL   Lymphocytes Relative 21 12 - 46 %   Lymphs Abs 1.7 0.7 - 4.0 K/uL   Monocytes Relative 10 3 - 12 %   Monocytes Absolute 0.8 0.1 - 1.0 K/uL   Eosinophils Relative 1 0 - 5 %   Eosinophils Absolute 0.1 0.0 - 0.7 K/uL   Basophils Relative 1 0 - 1 %   Basophils Absolute 0.1 0.0 - 0.1 K/uL   Smear Review Criteria for review not met   COMPLETE METABOLIC PANEL WITH GFR  Result Value Ref Range   Sodium 134 (L) 135 - 145 mEq/L   Potassium 4.3 3.5 - 5.3 mEq/L   Chloride 100 96 - 112 mEq/L   CO2 28 19 - 32 mEq/L   Glucose, Bld 86 70 - 99 mg/dL   BUN 13 6 - 23 mg/dL   Creat 1.07 0.50 - 1.35 mg/dL   Total Bilirubin 0.4 0.2 - 1.2 mg/dL   Alkaline Phosphatase 102 39 - 117 U/L   AST 26 0 - 37 U/L   ALT 50 0 - 53 U/L   Total Protein 6.6 6.0 - 8.3 g/dL   Albumin 4.6 3.5 - 5.2 g/dL   Calcium 9.6 8.4 - 10.5 mg/dL   GFR, Est African American >89 mL/min   GFR, Est Non African American >89 mL/min      Assessment & Plan:  Essential hypertension, benign - Plan: CBC with Differential, COMPLETE METABOLIC PANEL WITH GFR  Allergic rhinitis, cause unspecified - Plan: fluticasone (FLONASE) 50 MCG/ACT nasal spray  Migraine  GERD (gastroesophageal reflux disease) - Plan: RABEprazole (ACIPHEX) 20 MG tablet   1. HTN: controlled; obtain labs; continue current medications. 2.  Allergic Rhinitis:  Worsening; increase Flonase to daily; run humidifier qhs. 3.  Migraines: persistent; did not undergo MRI; currently no red flags or concerning features to migraines; rx for Imitrex provided.  Educated on use of Imitrex. 4.  GERD: uncontrolled with Omeprazole; best results in the past with Aciphex; rx for Aciphex provided. 5. Tobacco abuse:  contemplative; RTC two months to discuss smoking cessation considering upcoming birth of new baby.   Meds ordered this encounter  Medications  . fluticasone (FLONASE) 50 MCG/ACT nasal spray    Sig: Place 2 sprays into both nostrils daily.    Dispense:  16 g  Refill:  11  . DISCONTD: SUMAtriptan (IMITREX) 100 MG tablet    Sig: Take 1 tablet (100 mg total) by mouth every 2 (two) hours as needed for migraine or headache. May repeat in 2 hours if headache persists or recurs.    Dispense:  8 tablet    Refill:  4  . RABEprazole (ACIPHEX) 20 MG tablet    Sig: Take 1 tablet (20 mg total) by mouth daily.    Dispense:  30 tablet    Refill:  11  . DISCONTD: SUMAtriptan (IMITREX) 100 MG tablet    Sig: Take 1 tablet (100 mg total) by mouth every 2 (two) hours as needed for migraine or headache. May repeat in 2 hours if headache persists or recurs.    Dispense:  8 tablet    Refill:  4    Return in about 2 months (around 12/18/2013) for headaches, smoking.   I personally performed the services described in this documentation, which was scribed in my presence.  The recorded information has been reviewed and is accurate.  Reginia Forts, M.D.  [facility] [address] [place], [place]  [address] [phone] phone [phone] fax

## 2013-10-20 ENCOUNTER — Telehealth: Payer: Self-pay

## 2013-10-20 NOTE — Telephone Encounter (Signed)
PA needed for rabeprazole (Aciphex). Called pt verified he has tried/failed omeprazole, prilosec and protonix in the past. He prev had successful tx of GERD w/Aciphex but had to DC bc lost his ins and could not afford. Completed form on covermymeds.

## 2013-10-20 NOTE — Telephone Encounter (Signed)
Received denial of PA because medication is "a plan exclusion for this member, so there is no coverage criteria to review and apply". Dr Tamala Julian, do you want to try a different med for pt. See his report of tried/failed meds below.

## 2013-10-21 MED ORDER — DEXLANSOPRAZOLE 60 MG PO CPDR: 60.0000 mg | DELAYED_RELEASE_CAPSULE | Freq: Every day | ORAL | Status: DC

## 2013-10-21 NOTE — Telephone Encounter (Signed)
Spoke with patient advised that dexilant was sent to patient's pharmacy.

## 2013-10-21 NOTE — Telephone Encounter (Signed)
Rx for Dexilant sent to pharmacy. Thanks.

## 2013-10-21 NOTE — Telephone Encounter (Signed)
Spoke to patient.  He has tried nexium in the past but not dexilant.

## 2013-10-21 NOTE — Telephone Encounter (Signed)
Pended higher strength of Dexilant for review since he has failed so many other medications.

## 2013-10-21 NOTE — Telephone Encounter (Signed)
The only other options are Nexium and Dexilant.  Can you call the patient to see if he has tried either of these in the past?

## 2013-10-28 ENCOUNTER — Telehealth: Payer: Self-pay

## 2013-10-28 NOTE — Telephone Encounter (Signed)
PA needed for Dexilant. Completed form on covermymeds.

## 2013-11-25 ENCOUNTER — Telehealth: Payer: Self-pay | Admitting: *Deleted

## 2013-11-25 MED ORDER — OMEPRAZOLE 40 MG PO CPDR: 40.0000 mg | DELAYED_RELEASE_CAPSULE | Freq: Every day | ORAL | Status: DC

## 2013-11-25 NOTE — Telephone Encounter (Signed)
Spoke to pharmacy about Dexilant prior-auth approval. Pt has a co pay of $225 for this medication- pharmacy is going to hold medication until further notice. Pharmacy suggested Omeprazole 3m  Spoke to pt- he has been taking 267momeprazole OTC- He sometimes has to take an additional one or a Zantac after dinner. Pt agrees that he would like to try to get the 4016mrescribed instead of OTC. Can we send this script in instead for the pt?

## 2013-11-25 NOTE — Telephone Encounter (Signed)
Approved rx for Omeprazole 53m daily; sent to local pharmacy.

## 2013-11-26 NOTE — Telephone Encounter (Signed)
LMOM that rx was sent to pharm .

## 2013-12-21 ENCOUNTER — Ambulatory Visit (INDEPENDENT_AMBULATORY_CARE_PROVIDER_SITE_OTHER): Payer: 59 | Admitting: Family Medicine

## 2013-12-21 ENCOUNTER — Encounter: Payer: Self-pay | Admitting: Family Medicine

## 2013-12-21 VITALS — BP 138/78 | HR 84 | Temp 98.9°F | Resp 18 | Wt 176.0 lb

## 2013-12-21 DIAGNOSIS — K219 Gastro-esophageal reflux disease without esophagitis: Secondary | ICD-10-CM

## 2013-12-21 DIAGNOSIS — R109 Unspecified abdominal pain: Secondary | ICD-10-CM

## 2013-12-21 DIAGNOSIS — G43909 Migraine, unspecified, not intractable, without status migrainosus: Secondary | ICD-10-CM

## 2013-12-21 DIAGNOSIS — F17200 Nicotine dependence, unspecified, uncomplicated: Secondary | ICD-10-CM

## 2013-12-21 DIAGNOSIS — I10 Essential (primary) hypertension: Secondary | ICD-10-CM

## 2013-12-21 DIAGNOSIS — K5090 Crohn's disease, unspecified, without complications: Secondary | ICD-10-CM

## 2013-12-21 LAB — COMPREHENSIVE METABOLIC PANEL
ALBUMIN: 4.4 g/dL (ref 3.5–5.2)
ALT: 31 U/L (ref 0–53)
AST: 19 U/L (ref 0–37)
Alkaline Phosphatase: 93 U/L (ref 39–117)
BUN: 12 mg/dL (ref 6–23)
CALCIUM: 9.2 mg/dL (ref 8.4–10.5)
CHLORIDE: 105 meq/L (ref 96–112)
CO2: 24 mEq/L (ref 19–32)
Creat: 1 mg/dL (ref 0.50–1.35)
GLUCOSE: 94 mg/dL (ref 70–99)
POTASSIUM: 4 meq/L (ref 3.5–5.3)
SODIUM: 138 meq/L (ref 135–145)
TOTAL PROTEIN: 6.4 g/dL (ref 6.0–8.3)
Total Bilirubin: 0.4 mg/dL (ref 0.2–1.2)

## 2013-12-21 LAB — CBC WITH DIFFERENTIAL/PLATELET
BASOS ABS: 0 10*3/uL (ref 0.0–0.1)
Basophils Relative: 0 % (ref 0–1)
Eosinophils Absolute: 0.1 10*3/uL (ref 0.0–0.7)
Eosinophils Relative: 1 % (ref 0–5)
HEMATOCRIT: 41.7 % (ref 39.0–52.0)
HEMOGLOBIN: 14.6 g/dL (ref 13.0–17.0)
LYMPHS PCT: 24 % (ref 12–46)
Lymphs Abs: 1.9 10*3/uL (ref 0.7–4.0)
MCH: 29.4 pg (ref 26.0–34.0)
MCHC: 35 g/dL (ref 30.0–36.0)
MCV: 83.9 fL (ref 78.0–100.0)
MONO ABS: 0.5 10*3/uL (ref 0.1–1.0)
MONOS PCT: 6 % (ref 3–12)
NEUTROS ABS: 5.6 10*3/uL (ref 1.7–7.7)
NEUTROS PCT: 69 % (ref 43–77)
Platelets: 217 10*3/uL (ref 150–400)
RBC: 4.97 MIL/uL (ref 4.22–5.81)
RDW: 12.9 % (ref 11.5–15.5)
WBC: 8.1 10*3/uL (ref 4.0–10.5)

## 2013-12-21 LAB — POCT URINALYSIS DIPSTICK
Bilirubin, UA: NEGATIVE
GLUCOSE UA: NEGATIVE
Ketones, UA: NEGATIVE
Leukocytes, UA: NEGATIVE
NITRITE UA: NEGATIVE
PROTEIN UA: NEGATIVE
RBC UA: NEGATIVE
SPEC GRAV UA: 1.025
UROBILINOGEN UA: 0.2
pH, UA: 6

## 2013-12-21 LAB — SEDIMENTATION RATE: Sed Rate: 4 mm/hr (ref 0–16)

## 2013-12-21 MED ORDER — LISINOPRIL-HYDROCHLOROTHIAZIDE 20-25 MG PO TABS: 1.0000 | ORAL_TABLET | Freq: Every day | ORAL | Status: DC

## 2013-12-21 MED ORDER — SUMATRIPTAN SUCCINATE 100 MG PO TABS: 100.0000 mg | ORAL_TABLET | ORAL | Status: DC | PRN

## 2013-12-21 NOTE — Progress Notes (Signed)
Subjective:  This chart was scribed for  Reginia Forts, MD  by Stacy Gardner, [facility] Scribe. The patient was seen in room and the patient's care was started at 4:28 PM.   Patient ID: Leroy Herrera, male    DOB: 06-17-1981, 33 y.o.   MRN: [id number]  12/21/2013  Hypertension   HPI HPI Comments: Leroy Herrera is a 33 y.o. male with chronic hx of HTN who arrives to the [facility] for a f/u of headaches, HTN and smoking . Pt was seen two months ago for the same complaint.  Pt was having migraines and was given Imitrex. He is having 3-4 migraines a day that is remains unresolved. He has photophobia during event and muscle tightness through out his head.  Denies numbness and tingling. Denies dizziness and light headedness.  Insurance would not pay for Imitrex so patient did not get medication filled. Has not missed work due to headache.  Pt's wife is having a baby 8/29 and  He would like help to stop smoking. He is smoking two packs a day ( "I take three puff off of the cigarettes") and he  only smokes at work.    Recently, pt had left-sided, non-radiating, burning, back pain, hip and left testicle pain while driving. The pain lasted for two days and was worse with certain positions or movements. He  explains that he has been doing strenuous work for may years. No urinary incontinence. No bowel incontinence. Denies any urinary symptoms.  Denies saddle paresthesias. Patient performs a lot of manual labor; he had a similar episode several months ago.  Low back pain seems to becoming more frequent. He would like recommendations regarding prevention of recurrent back injury.  He had an episode of severe upper abdominal cramping five days ago. This is the second time that this has occurred.  Pt was eating prior to when the event occurred. He had vomiting and no diarrhea. Pt had diaphoresis. He was unsure of fever. Denies hematochezia. Denies hematuria.  No  recent constipation. Pt is concerned about recurrent Crohn's exacerbation; it has been years since he had issues with Crohn's disease; he is requesting blood work to evaluate for Crohn's exacerbation.  Pt is pain free today.   He is using Flonase. Pt goes to the gym four times a week.     Review of Systems  Constitutional: Positive for diaphoresis. Negative for fever, chills and fatigue.  Eyes: Positive for photophobia. Negative for visual disturbance.  Respiratory: Negative for cough and shortness of breath.   Cardiovascular: Negative for chest pain, palpitations and leg swelling.  Gastrointestinal: Positive for abdominal pain and diarrhea. Negative for nausea, vomiting, constipation, blood in stool, anal bleeding and rectal pain.  Genitourinary: Negative for dysuria, urgency, frequency, hematuria, decreased urine volume, penile swelling, scrotal swelling, genital sores, penile pain and testicular pain.  Musculoskeletal: Positive for arthralgias, back pain and myalgias. Negative for gait problem.  Neurological: Positive for headaches. Negative for dizziness, syncope, facial asymmetry, light-headedness and numbness.    Past Medical History  Diagnosis Date  . Crohn's disease   . Hypertension   . Allergy   . Renal disorder     Nephrolithiasis  . Heroin addiction 06/23/2009    Remission since 2011.  Marland Kitchen Pancreatitis    Past Surgical History  Procedure Laterality Date  . Bowel resection    . Cholecystectomy    . Tonsillectomy    . Appendectomy    .  Colon surgery      Allergies  Allergen Reactions  . Prednisone Anaphylaxis    High dose allergy  . Morphine And Related Hives  . Vicodin [Hydrocodone-Acetaminophen] Hives and Itching   Current Outpatient Prescriptions  Medication Sig Dispense Refill  . fluticasone (FLONASE) 50 MCG/ACT nasal spray Place 2 sprays into both nostrils daily.  16 g  11  . ibuprofen (ADVIL,MOTRIN) 200 MG tablet Take 800 mg by mouth every 6 (six) hours as  needed for pain.      Marland Kitchen lisinopril-hydrochlorothiazide (PRINZIDE,ZESTORETIC) 20-25 MG per tablet Take 1 tablet by mouth daily.  30 tablet  5  . omeprazole (PRILOSEC) 40 MG capsule Take 1 capsule (40 mg total) by mouth daily.  90 capsule  3  . dexlansoprazole (DEXILANT) 60 MG capsule Take 1 capsule (60 mg total) by mouth daily.  30 capsule  5  . lisinopril-hydrochlorothiazide (PRINZIDE,ZESTORETIC) 20-25 MG per tablet TAKE 1 TABLET BY MOUTH DAILY.  30 tablet  5  . RABEprazole (ACIPHEX) 20 MG tablet Take 1 tablet (20 mg total) by mouth daily.  30 tablet  11  . SUMAtriptan (IMITREX) 100 MG tablet Take 1 tablet (100 mg total) by mouth every 2 (two) hours as needed for migraine or headache. May repeat in 2 hours if headache persists or recurs.  8 tablet  4   No current facility-administered medications for this visit.   History   Social History  . Marital Status: Married    Spouse Name: N/A    Number of Children: N/A  . Years of Education: N/A   Occupational History  . Not on file.   Social History Main Topics  . Smoking status: Current Every Day Smoker -- 1.50 packs/day  . Smokeless tobacco: Never Used  . Alcohol Use: Yes     Comment: occasional  . Drug Use: No     Comment: PREVIOUS HEROIN ADDICTION 2011; RECOVERY SINCE 2011.  Marland Kitchen Sexual Activity: Yes   Other Topics Concern  . Not on file   Social History Narrative   Marital status: Married x 1 year; second marriage. Divorced from first marriage due to heroin addiction.      Children: one daughter who lives with mother.      Lives: with wife.      Employment: auto repairs      Tobacco:  1.5 ppd      Alcohol: weekends      Drugs: recovering heroin addict since 2011.  Narcotic abuse/overuse/misuse in past.      Exercise: none       No family history on file.      Objective:    BP 138/78  Pulse 84  Temp(Src) 98.9 F (37.2 C)  Resp 18  Wt 176 lb (79.833 kg)  SpO2 99% Physical Exam  Nursing note and vitals  reviewed. Constitutional: He is oriented to person, place, and time. He appears well-developed and well-nourished. No distress.  HENT:  Head: Normocephalic and atraumatic.  Right Ear: External ear normal.  Left Ear: External ear normal.  Nose: Nose normal.  Mouth/Throat: Oropharynx is clear and moist.  Eyes: Conjunctivae and EOM are normal. Pupils are equal, round, and reactive to light.  Neck: Normal range of motion. Neck supple. Carotid bruit is not present. No thyromegaly present.  Cardiovascular: Normal rate, regular rhythm, normal heart sounds and intact distal pulses.  Exam reveals no gallop and no friction rub.   No murmur heard. Pulmonary/Chest: Effort normal and breath sounds normal. He has  no wheezes. He has no rales.  Abdominal: Soft. Bowel sounds are normal. He exhibits no distension and no mass. There is no tenderness. There is no rebound and no guarding.  Musculoskeletal:       Lumbar back: Normal. He exhibits normal range of motion, no tenderness, no bony tenderness, no pain and no spasm.  Lymphadenopathy:    He has no cervical adenopathy.  Neurological: He is alert and oriented to person, place, and time. No cranial nerve deficit.  Skin: Skin is warm and dry. No rash noted. He is not diaphoretic.  Psychiatric: He has a normal mood and affect. His behavior is normal.   Results for orders placed in visit on 12/21/13  CBC WITH DIFFERENTIAL      Result Value Ref Range   WBC 8.1  4.0 - 10.5 K/uL   RBC 4.97  4.22 - 5.81 MIL/uL   Hemoglobin 14.6  13.0 - 17.0 g/dL   HCT 41.7  39.0 - 52.0 %   MCV 83.9  78.0 - 100.0 fL   MCH 29.4  26.0 - 34.0 pg   MCHC 35.0  30.0 - 36.0 g/dL   RDW 12.9  11.5 - 15.5 %   Platelets 217  150 - 400 K/uL   Neutrophils Relative % 69  43 - 77 %   Neutro Abs 5.6  1.7 - 7.7 K/uL   Lymphocytes Relative 24  12 - 46 %   Lymphs Abs 1.9  0.7 - 4.0 K/uL   Monocytes Relative 6  3 - 12 %   Monocytes Absolute 0.5  0.1 - 1.0 K/uL   Eosinophils Relative 1  0  - 5 %   Eosinophils Absolute 0.1  0.0 - 0.7 K/uL   Basophils Relative 0  0 - 1 %   Basophils Absolute 0.0  0.0 - 0.1 K/uL   Smear Review Criteria for review not met    COMPREHENSIVE METABOLIC PANEL      Result Value Ref Range   Sodium 138  135 - 145 mEq/L   Potassium 4.0  3.5 - 5.3 mEq/L   Chloride 105  96 - 112 mEq/L   CO2 24  19 - 32 mEq/L   Glucose, Bld 94  70 - 99 mg/dL   BUN 12  6 - 23 mg/dL   Creat 1.00  0.50 - 1.35 mg/dL   Total Bilirubin 0.4  0.2 - 1.2 mg/dL   Alkaline Phosphatase 93  39 - 117 U/L   AST 19  0 - 37 U/L   ALT 31  0 - 53 U/L   Total Protein 6.4  6.0 - 8.3 g/dL   Albumin 4.4  3.5 - 5.2 g/dL   Calcium 9.2  8.4 - 10.5 mg/dL  SEDIMENTATION RATE      Result Value Ref Range   Sed Rate 4  0 - 16 mm/hr  POCT URINALYSIS DIPSTICK      Result Value Ref Range   Color, UA yellow     Clarity, UA clear     Glucose, UA neg     Bilirubin, UA neg     Ketones, UA neg     Spec Grav, UA 1.025     Blood, UA neg     pH, UA 6.0     Protein, UA neg     Urobilinogen, UA 0.2     Nitrite, UA neg     Leukocytes, UA Negative         Assessment & Plan:  Essential hypertension, benign - Plan: lisinopril-hydrochlorothiazide (PRINZIDE,ZESTORETIC) 20-25 MG per tablet  Gastroesophageal reflux disease, esophagitis presence not specified  Migraine, unspecified, without mention of intractable migraine without mention of status migrainosus  Tobacco abuse  Abdominal pain, unspecified site - Plan: CBC with Differential, Comprehensive metabolic panel, POCT urinalysis dipstick, Sedimentation rate  Crohn's disease, without complications   1. HTN: controlled; obtain labs; refill of medication provided. 2. GERD: controlled; no change in therapy. 3.  Migraine headaches: persistent but stable; rx for Imitrex provided again and counseled on appropriate use of medication. 4.  Tobacco abuse: truly pre-contemplative at this time. 5.  Abdominal pain generalized: recurrent; obtain labs  including CBC, CMET, ESR to evaluate for Crohn's disease. 6. Crohn's disease: stable; no recent issues in many years.  Meds ordered this encounter  Medications  . lisinopril-hydrochlorothiazide (PRINZIDE,ZESTORETIC) 20-25 MG per tablet    Sig: Take 1 tablet by mouth daily.    Dispense:  30 tablet    Refill:  5  . SUMAtriptan (IMITREX) 100 MG tablet    Sig: Take 1 tablet (100 mg total) by mouth every 2 (two) hours as needed for migraine or headache. May repeat in 2 hours if headache persists or recurs.    Dispense:  8 tablet    Refill:  4    Return in about 6 months (around 06/23/2014) for complete physical examiniation.   I personally performed the services described in this documentation, which was scribed in my presence.  The recorded information has been reviewed and is accurate.  Reginia Forts, M.D.  [facility] [address] [place], [place]  [address] [phone] phone [phone] fax

## 2013-12-25 ENCOUNTER — Other Ambulatory Visit: Payer: Self-pay | Admitting: Family Medicine

## 2014-02-22 ENCOUNTER — Emergency Department (HOSPITAL_BASED_OUTPATIENT_CLINIC_OR_DEPARTMENT_OTHER): Payer: 59

## 2014-02-22 ENCOUNTER — Encounter (HOSPITAL_BASED_OUTPATIENT_CLINIC_OR_DEPARTMENT_OTHER): Payer: Self-pay | Admitting: Emergency Medicine

## 2014-02-22 ENCOUNTER — Emergency Department (HOSPITAL_BASED_OUTPATIENT_CLINIC_OR_DEPARTMENT_OTHER)
Admission: EM | Admit: 2014-02-22 | Discharge: 2014-02-22 | Disposition: A | Discharge status: Home / Self Care | Payer: 59 | Attending: Emergency Medicine | Admitting: Emergency Medicine

## 2014-02-22 DIAGNOSIS — Y9289 Other specified places as the place of occurrence of the external cause: Secondary | ICD-10-CM | POA: Diagnosis not present

## 2014-02-22 DIAGNOSIS — W010XXA Fall on same level from slipping, tripping and stumbling without subsequent striking against object, initial encounter: Secondary | ICD-10-CM | POA: Insufficient documentation

## 2014-02-22 DIAGNOSIS — I10 Essential (primary) hypertension: Secondary | ICD-10-CM | POA: Diagnosis not present

## 2014-02-22 DIAGNOSIS — Z8719 Personal history of other diseases of the digestive system: Secondary | ICD-10-CM | POA: Insufficient documentation

## 2014-02-22 DIAGNOSIS — S298XXA Other specified injuries of thorax, initial encounter: Secondary | ICD-10-CM | POA: Diagnosis present

## 2014-02-22 DIAGNOSIS — W108XXA Fall (on) (from) other stairs and steps, initial encounter: Secondary | ICD-10-CM | POA: Insufficient documentation

## 2014-02-22 DIAGNOSIS — S20211A Contusion of right front wall of thorax, initial encounter: Secondary | ICD-10-CM

## 2014-02-22 DIAGNOSIS — S20219A Contusion of unspecified front wall of thorax, initial encounter: Secondary | ICD-10-CM | POA: Diagnosis not present

## 2014-02-22 DIAGNOSIS — Y9301 Activity, walking, marching and hiking: Secondary | ICD-10-CM | POA: Diagnosis not present

## 2014-02-22 DIAGNOSIS — F17200 Nicotine dependence, unspecified, uncomplicated: Secondary | ICD-10-CM | POA: Insufficient documentation

## 2014-02-22 DIAGNOSIS — Z87442 Personal history of urinary calculi: Secondary | ICD-10-CM | POA: Diagnosis not present

## 2014-02-22 DIAGNOSIS — IMO0002 Reserved for concepts with insufficient information to code with codable children: Secondary | ICD-10-CM | POA: Insufficient documentation

## 2014-02-22 DIAGNOSIS — Z79899 Other long term (current) drug therapy: Secondary | ICD-10-CM | POA: Diagnosis not present

## 2014-02-22 MED ORDER — OXYCODONE-ACETAMINOPHEN 5-325 MG PO TABS: 1.0000 | ORAL_TABLET | ORAL | Status: DC | PRN

## 2014-02-22 MED ORDER — ACETAMINOPHEN 500 MG PO TABS: 1000.0000 mg | ORAL_TABLET | Freq: Once | ORAL | Status: DC

## 2014-02-22 NOTE — ED Notes (Signed)
Patient states that Saturday he fell onto his right side. Since his ribs have hurt and he had a increased pain while taking deep breaths.

## 2014-02-22 NOTE — ED Provider Notes (Signed)
CSN: [id number]     Arrival date & time 02/22/14  0455 History   First MD Initiated Contact with Patient 02/22/14 [phone]     Chief Complaint  Patient presents with  . Rib Injury     (Consider location/radiation/quality/duration/timing/severity/associated sxs/prior Treatment) HPI 33 year old male presents with right chest pain after a fall 2 days ago. States he was walking down the stairs to do laundry he thinks his foot slipped on the stairs. He landed on his right side. He states he's not have much pain at time of impact but is progressively had worsening pain in his right side. States it hurts worse to breathe in. No shortness of breath. Rates the pain as a 7/10. He states that he's been up all night in pain and so he came in to get checked out and get an x-ray. Has been taking ibuprofen with minimal relief. Last took ibuprofen about 3 hours ago. Patient did not hit his head or neck.  Past Medical History  Diagnosis Date  . Crohn's disease   . Hypertension   . Allergy   . Renal disorder     Nephrolithiasis  . Heroin addiction 06/23/2009    Remission since 2011.  Marland Kitchen Pancreatitis    Past Surgical History  Procedure Laterality Date  . Bowel resection    . Cholecystectomy    . Tonsillectomy    . Appendectomy    . Colon surgery     History reviewed. No pertinent family history. History  Substance Use Topics  . Smoking status: Current Every Day Smoker -- 1.50 packs/day  . Smokeless tobacco: Never Used  . Alcohol Use: Yes     Comment: occasional    Review of Systems  Respiratory: Negative for shortness of breath.   Cardiovascular: Positive for chest pain.  All other systems reviewed and are negative.     Allergies  Prednisone; Morphine and related; and Vicodin  Home Medications   Prior to Admission medications   Medication Sig Start Date End Date Taking? Authorizing Provider  dexlansoprazole (DEXILANT) 60 MG capsule Take 1 capsule (60 mg total) by mouth daily. 10/21/13    Wardell Honour, MD  fluticasone (FLONASE) 50 MCG/ACT nasal spray Place 2 sprays into both nostrils daily. 10/18/13   Wardell Honour, MD  ibuprofen (ADVIL,MOTRIN) 200 MG tablet Take 800 mg by mouth every 6 (six) hours as needed for pain.    Historical Provider, MD  lisinopril-hydrochlorothiazide (PRINZIDE,ZESTORETIC) 20-25 MG per tablet Take 1 tablet by mouth daily. 12/21/13   Wardell Honour, MD  lisinopril-hydrochlorothiazide (PRINZIDE,ZESTORETIC) 20-25 MG per tablet TAKE 1 TABLET BY MOUTH DAILY. 12/25/13   Wardell Honour, MD  omeprazole (PRILOSEC) 40 MG capsule Take 1 capsule (40 mg total) by mouth daily. 11/25/13   Wardell Honour, MD  RABEprazole (ACIPHEX) 20 MG tablet Take 1 tablet (20 mg total) by mouth daily. 10/18/13   Wardell Honour, MD  SUMAtriptan (IMITREX) 100 MG tablet Take 1 tablet (100 mg total) by mouth every 2 (two) hours as needed for migraine or headache. May repeat in 2 hours if headache persists or recurs. 12/21/13   Wardell Honour, MD   BP 145/93  Pulse 95  Temp(Src) 97.9 F (36.6 C) (Oral)  Resp 16  Ht 6' (1.829 m)  Wt 180 lb (81.647 kg)  BMI 24.41 kg/m2  SpO2 100% Physical Exam  Nursing note and vitals reviewed. Constitutional: He is oriented to person, place, and time. He appears well-developed and well-nourished. No  distress.  HENT:  Head: Normocephalic and atraumatic.  Right Ear: External ear normal.  Left Ear: External ear normal.  Nose: Nose normal.  Eyes: Right eye exhibits no discharge. Left eye exhibits no discharge.  Neck: Neck supple.  Cardiovascular: Normal rate, regular rhythm, normal heart sounds and intact distal pulses.   Pulmonary/Chest: Effort normal and breath sounds normal. He has no wheezes. He has no rales.   He exhibits tenderness.    Abdominal: He exhibits no distension.  Neurological: He is alert and oriented to person, place, and time.  Skin: Skin is warm and dry.    ED Course  Procedures (including critical care time) Labs Review Labs  Reviewed - No data to display  Imaging Review Dg Ribs Unilateral W/chest Right  02/22/2014   CLINICAL DATA:  Fall, right rib pain, shortness of breath.  EXAM: RIGHT RIBS AND CHEST - 3+ VIEW  COMPARISON:  09/24/2011  FINDINGS: Lungs are clear. Cardiomediastinal contours within normal range. A BB marker projects over the lateral right lower chest, corresponding to the level of most severe pain. No displaced rib fracture identified.  IMPRESSION: No displaced rib fracture identified.  No radiographic evidence of active cardiopulmonary disease.   Electronically Signed   By: Carlos Levering M.D.   On: 02/22/2014 05:34     EKG Interpretation None      MDM   Final diagnoses:  Rib contusion, right, initial encounter    X-ray shows no obvious fracture or pneumothorax. Patient's exam is benign besides chest wall tenderness as above. We'll give a short course of Percocet as needed for severe pain and otherwise will use ibuprofen, Tylenol, and ice.    Ephraim Hamburger, MD 02/22/14 [phone]

## 2014-02-22 NOTE — Discharge Instructions (Signed)
Blunt Chest Trauma Blunt chest trauma is an injury caused by a blow to the chest. These chest injuries can be very painful. Blunt chest trauma often results in bruised or broken (fractured) ribs. Most cases of bruised and fractured ribs from blunt chest traumas get better after 1 to 3 weeks of rest and pain medicine. Often, the soft tissue in the chest wall is also injured, causing pain and bruising. Internal organs, such as the heart and lungs, may also be injured. Blunt chest trauma can lead to serious medical problems. This injury requires immediate medical care. CAUSES   Motor vehicle collisions.  Falls.  Physical violence.  Sports injuries. SYMPTOMS   Chest pain. The pain may be worse when you move or breathe deeply.  Shortness of breath.  Lightheadedness.  Bruising.  Tenderness.  Swelling. DIAGNOSIS  Your caregiver will do a physical exam. X-rays may be taken to look for fractures. However, minor rib fractures may not show up on X-rays until a few days after the injury. If a more serious injury is suspected, further imaging tests may be done. This may include ultrasounds, computed tomography (CT) scans, or magnetic resonance imaging (MRI). TREATMENT  Treatment depends on the severity of your injury. Your caregiver may prescribe pain medicines and deep breathing exercises. HOME CARE INSTRUCTIONS  Limit your activities until you can move around without much pain.  Do not do any strenuous work until your injury is healed.  Put ice on the injured area.  Put ice in a plastic bag.  Place a towel between your skin and the bag.  Leave the ice on for 15-20 minutes, 03-04 times a day.  You may wear a rib belt as directed by your caregiver to reduce pain.  Practice deep breathing as directed by your caregiver to keep your lungs clear.  Only take over-the-counter or prescription medicines for pain, fever, or discomfort as directed by your caregiver. SEEK IMMEDIATE MEDICAL  CARE IF:   You have increasing pain or shortness of breath.  You cough up blood.  You have nausea, vomiting, or abdominal pain.  You have a fever.  You feel dizzy, weak, or you faint. MAKE SURE YOU:  Understand these instructions.  Will watch your condition.  Will get help right away if you are not doing well or get worse. Document Released: 07/17/2004 Document Revised: 09/01/2011 Document Reviewed: 03/26/2011 [facility] Patient Information 2015 [place], [place]. This information is not intended to replace advice given to you by your health care provider. Make sure you discuss any questions you have with your health care provider.     Chest Contusion A chest contusion is a deep bruise on your chest area. Contusions are the result of an injury that caused bleeding under the skin. A chest contusion may involve bruising of the skin, muscles, or ribs. The contusion may turn blue, purple, or yellow. Minor injuries will give you a painless contusion, but more severe contusions may stay painful and swollen for a few weeks. CAUSES  A contusion is usually caused by a blow, trauma, or direct force to an area of the body. SYMPTOMS   Swelling and redness of the injured area.  Discoloration of the injured area.  Tenderness and soreness of the injured area.  Pain. DIAGNOSIS  The diagnosis can be made by taking a history and performing a physical exam. An X-ray, CT scan, or MRI may be needed to determine if there were any associated injuries, such as broken bones (fractures) or internal  injuries. TREATMENT  Often, the best treatment for a chest contusion is resting, icing, and applying cold compresses to the injured area. Deep breathing exercises may be recommended to reduce the risk of pneumonia. Over-the-counter medicines may also be recommended for pain control. HOME CARE INSTRUCTIONS   Put ice on the injured area.  Put ice in a plastic bag.  Place a towel between your skin and the  bag.  Leave the ice on for 15-20 minutes, 03-04 times a day.  Only take over-the-counter or prescription medicines as directed by your caregiver. Your caregiver may recommend avoiding anti-inflammatory medicines (aspirin, ibuprofen, and naproxen) for 48 hours because these medicines may increase bruising.  Rest the injured area.  Perform deep-breathing exercises as directed by your caregiver.  Stop smoking if you smoke.  Do not lift objects over 5 pounds (2.3 kg) for 3 days or longer if recommended by your caregiver. SEEK IMMEDIATE MEDICAL CARE IF:   You have increased bruising or swelling.  You have pain that is getting worse.  You have difficulty breathing.  You have dizziness, weakness, or fainting.  You have blood in your urine or stool.  You cough up or vomit blood.  Your swelling or pain is not relieved with medicines. MAKE SURE YOU:   Understand these instructions.  Will watch your condition.  Will get help right away if you are not doing well or get worse. Document Released: 03/04/2001 Document Revised: 03/03/2012 Document Reviewed: 12/01/2011 [facility] Patient Information 2015 [place], [place]. This information is not intended to replace advice given to you by your health care provider. Make sure you discuss any questions you have with your health care provider.

## 2014-03-17 NOTE — Telephone Encounter (Signed)
Pt changed to omeprazole d/t high co-pay.

## 2014-04-25 ENCOUNTER — Telehealth: Payer: Self-pay

## 2014-04-25 NOTE — Telephone Encounter (Signed)
PA needed for omeprazole. Completed on covermymeds. Pending.

## 2014-04-27 NOTE — Telephone Encounter (Signed)
PA approved through 04/26/15. Notified pharm.

## 2014-07-04 DIAGNOSIS — Z0271 Encounter for disability determination: Secondary | ICD-10-CM

## 2014-11-11 ENCOUNTER — Other Ambulatory Visit: Payer: Self-pay | Admitting: Family Medicine

## 2014-11-20 ENCOUNTER — Encounter (HOSPITAL_BASED_OUTPATIENT_CLINIC_OR_DEPARTMENT_OTHER): Payer: Self-pay | Admitting: Emergency Medicine

## 2014-11-20 ENCOUNTER — Emergency Department (HOSPITAL_BASED_OUTPATIENT_CLINIC_OR_DEPARTMENT_OTHER)
Admission: EM | Admit: 2014-11-20 | Discharge: 2014-11-20 | Disposition: A | Discharge status: Home / Self Care | Payer: 59 | Attending: Emergency Medicine | Admitting: Emergency Medicine

## 2014-11-20 ENCOUNTER — Emergency Department (HOSPITAL_BASED_OUTPATIENT_CLINIC_OR_DEPARTMENT_OTHER): Payer: 59

## 2014-11-20 DIAGNOSIS — Z720 Tobacco use: Secondary | ICD-10-CM | POA: Insufficient documentation

## 2014-11-20 DIAGNOSIS — R1084 Generalized abdominal pain: Secondary | ICD-10-CM | POA: Diagnosis not present

## 2014-11-20 DIAGNOSIS — Z87442 Personal history of urinary calculi: Secondary | ICD-10-CM | POA: Insufficient documentation

## 2014-11-20 DIAGNOSIS — Z8719 Personal history of other diseases of the digestive system: Secondary | ICD-10-CM | POA: Diagnosis not present

## 2014-11-20 DIAGNOSIS — I10 Essential (primary) hypertension: Secondary | ICD-10-CM | POA: Insufficient documentation

## 2014-11-20 DIAGNOSIS — R1010 Upper abdominal pain, unspecified: Secondary | ICD-10-CM | POA: Diagnosis present

## 2014-11-20 DIAGNOSIS — Z79899 Other long term (current) drug therapy: Secondary | ICD-10-CM | POA: Diagnosis not present

## 2014-11-20 LAB — CBC WITH DIFFERENTIAL/PLATELET
Basophils Absolute: 0.1 10*3/uL (ref 0.0–0.1)
Basophils Relative: 1 % (ref 0–1)
EOS ABS: 0.1 10*3/uL (ref 0.0–0.7)
Eosinophils Relative: 1 % (ref 0–5)
HEMATOCRIT: 45.9 % (ref 39.0–52.0)
HEMOGLOBIN: 15.9 g/dL (ref 13.0–17.0)
LYMPHS ABS: 1.4 10*3/uL (ref 0.7–4.0)
Lymphocytes Relative: 15 % (ref 12–46)
MCH: 29.6 pg (ref 26.0–34.0)
MCHC: 34.6 g/dL (ref 30.0–36.0)
MCV: 85.3 fL (ref 78.0–100.0)
Monocytes Absolute: 0.6 10*3/uL (ref 0.1–1.0)
Monocytes Relative: 7 % (ref 3–12)
Neutro Abs: 7.3 10*3/uL (ref 1.7–7.7)
Neutrophils Relative %: 76 % (ref 43–77)
Platelets: 206 10*3/uL (ref 150–400)
RBC: 5.38 MIL/uL (ref 4.22–5.81)
RDW: 12.1 % (ref 11.5–15.5)
WBC: 9.4 10*3/uL (ref 4.0–10.5)

## 2014-11-20 LAB — URINALYSIS, ROUTINE W REFLEX MICROSCOPIC
Bilirubin Urine: NEGATIVE
Glucose, UA: NEGATIVE mg/dL
Hgb urine dipstick: NEGATIVE
KETONES UR: NEGATIVE mg/dL
LEUKOCYTES UA: NEGATIVE
Nitrite: NEGATIVE
PH: 6.5 (ref 5.0–8.0)
PROTEIN: NEGATIVE mg/dL
Specific Gravity, Urine: 1.02 (ref 1.005–1.030)
Urobilinogen, UA: 0.2 mg/dL (ref 0.0–1.0)

## 2014-11-20 LAB — COMPREHENSIVE METABOLIC PANEL
ALT: 47 U/L (ref 17–63)
ANION GAP: 12 (ref 5–15)
AST: 31 U/L (ref 15–41)
Albumin: 4.4 g/dL (ref 3.5–5.0)
Alkaline Phosphatase: 109 U/L (ref 38–126)
BILIRUBIN TOTAL: 0.4 mg/dL (ref 0.3–1.2)
BUN: 10 mg/dL (ref 6–20)
CALCIUM: 10.1 mg/dL (ref 8.9–10.3)
CO2: 25 mmol/L (ref 22–32)
Chloride: 103 mmol/L (ref 101–111)
Creatinine, Ser: 0.97 mg/dL (ref 0.61–1.24)
GFR calc non Af Amer: >60 mL/min (ref >60)
GLUCOSE: 106 mg/dL — AB (ref 65–99)
Potassium: 3.8 mmol/L (ref 3.5–5.1)
Sodium: 140 mmol/L (ref 135–145)
TOTAL PROTEIN: 7.3 g/dL (ref 6.5–8.1)

## 2014-11-20 LAB — LIPASE, BLOOD: LIPASE: 24 U/L (ref 22–51)

## 2014-11-20 LAB — OCCULT BLOOD X 1 CARD TO LAB, STOOL: FECAL OCCULT BLD: NEGATIVE

## 2014-11-20 MED ORDER — DICYCLOMINE HCL 20 MG PO TABS: 20.0000 mg | ORAL_TABLET | Freq: Two times a day (BID) | ORAL | Status: AC

## 2014-11-20 NOTE — ED Provider Notes (Signed)
CSN: [id number]     Arrival date & time 11/20/14  0740 History   First MD Initiated Contact with Patient 11/20/14 [phone]     Chief Complaint  Patient presents with  . Abdominal Pain     (Consider location/radiation/quality/duration/timing/severity/associated sxs/prior Treatment) HPI 2 weeks of abdominal pain. Waxing and waning bloating sensation. Usually upper abdomen. No vomiting. Somewhat decreased appetite. Stools alternate from loose up to 6 per day to hard and formed. Stool this morning seemed more "normal". No fever, no urinary symptoms. Patient with history of Crohn's disease. He states "50\50" as to whether or not this feels like a Crohn's exacerbation. Patient takes daily Prilosec. Does not take regular Crohn's medications. Reports is typical for him to get an abdominal pain exacerbation 3-4 times per year. Reports they usually last for 3-4 days. This is atypical as he's had pain for 2 weeks. Past Medical History  Diagnosis Date  . Crohn's disease   . Hypertension   . Allergy   . Renal disorder     Nephrolithiasis  . Heroin addiction 06/23/2009    Remission since 2011.  Marland Kitchen Pancreatitis    Past Surgical History  Procedure Laterality Date  . Bowel resection    . Cholecystectomy    . Tonsillectomy    . Appendectomy    . Colon surgery     No family history on file. History  Substance Use Topics  . Smoking status: Current Every Day Smoker -- 1.50 packs/day  . Smokeless tobacco: Never Used  . Alcohol Use: Yes     Comment: occasional    Review of Systems 10 Systems reviewed and are negative for acute change except as noted in the HPI.    Allergies  Prednisone; Morphine and related; and Vicodin  Home Medications   Prior to Admission medications   Medication Sig Start Date End Date Taking? Authorizing Provider  lisinopril-hydrochlorothiazide (PRINZIDE,ZESTORETIC) 20-25 MG per tablet Take 1 tablet by mouth daily. 12/21/13  Yes Wardell Honour, MD  omeprazole (PRILOSEC) 40  MG capsule TAKE 1 CAPSULE (40 MG TOTAL) BY MOUTH DAILY. 11/13/14  Yes Wardell Honour, MD  dicyclomine (BENTYL) 20 MG tablet Take 1 tablet (20 mg total) by mouth 2 (two) times daily. 11/20/14   Charlesetta Shanks, MD  ibuprofen (ADVIL,MOTRIN) 200 MG tablet Take 800 mg by mouth every 6 (six) hours as needed for pain.    Historical Provider, MD   BP 140/86 mmHg  Pulse 88  Temp(Src) 98 F (36.7 C) (Oral)  Resp 16  Ht 6' (1.829 m)  Wt 175 lb (79.379 kg)  BMI 23.73 kg/m2  SpO2 100% Physical Exam  Constitutional: He is oriented to person, place, and time. He appears well-developed and well-nourished.  HENT:  Head: Normocephalic and atraumatic.  Eyes: EOM are normal. Pupils are equal, round, and reactive to light.  Neck: Neck supple.  Cardiovascular: Normal rate, regular rhythm, normal heart sounds and intact distal pulses.   Pulmonary/Chest: Effort normal and breath sounds normal.  Abdominal: Soft. Bowel sounds are normal. He exhibits no distension. There is tenderness.  Mild tenderness in variable locations. No guarding no rebound.  Genitourinary: Rectum normal.  Trace brown stool in the vault.  Musculoskeletal: Normal range of motion. He exhibits no edema.  Neurological: He is alert and oriented to person, place, and time. He has normal strength. Coordination normal. GCS eye subscore is 4. GCS verbal subscore is 5. GCS motor subscore is 6.  Skin: Skin is warm, dry and intact.  Psychiatric: He has a normal mood and affect.    ED Course  Procedures (including critical care time) Labs Review Labs Reviewed  COMPREHENSIVE METABOLIC PANEL - Abnormal; Notable for the following:    Glucose, Bld 106 (*)    All other components within normal limits  URINALYSIS, ROUTINE W REFLEX MICROSCOPIC (NOT AT [facility])  LIPASE, BLOOD  CBC WITH DIFFERENTIAL/PLATELET  OCCULT BLOOD X 1 CARD TO LAB, STOOL    Imaging Review Dg Abd Acute W/chest  11/20/2014   CLINICAL DATA:  Epigastric pain radiating to right  upper quadrant. Nausea and episodes of diarrhea/constipation.  EXAM: DG ABDOMEN ACUTE W/ 1V CHEST  COMPARISON:  02/22/2014  FINDINGS: There is no evidence of dilated bowel loops or free intraperitoneal air. Right upper quadrant cholecystectomy clips noted. No radiopaque calculi or other significant radiographic abnormality is seen. Heart size and mediastinal contours are within normal limits. Both lungs are clear.  IMPRESSION: Negative abdominal radiographs.  No acute cardiopulmonary disease.   Electronically Signed   By: Kerby Moors M.D.   On: 11/20/2014 08:40     EKG Interpretation None      MDM   Final diagnoses:  Generalized abdominal pain  History of Crohn's disease   At this time there does not appear to be an acute exacerbation of Crohn's. There is been no fever, no bloody stool, no leukocytosis and minimally reproducible abdominal pain. The patient's abdominal examination is nonsurgical with no guarding and mild diffuse discomfort to palpation. No evidence of obstruction. Diagnostic studies are normal this point time. Symptoms have been present for approximately 2 weeks without development of fever or vomiting. They she'll be treated symptomatically with Bentyl and advised to continue his Prilosec. He has follow-up scheduled with his family physician this week and he will be counseled to continue to work with gastroenterology as well based on his history.    Charlesetta Shanks, MD 11/20/14 [phone]

## 2014-11-20 NOTE — Discharge Instructions (Signed)
Abdominal Pain Many things can cause abdominal pain. Usually, abdominal pain is not caused by a disease and will improve without treatment. It can often be observed and treated at home. Your health care provider will do a physical exam and possibly order blood tests and X-rays to help determine the seriousness of your pain. However, in many cases, more time must pass before a clear cause of the pain can be found. Before that point, your health care provider may not know if you need more testing or further treatment. HOME CARE INSTRUCTIONS  Monitor your abdominal pain for any changes. The following actions may help to alleviate any discomfort you are experiencing:  Only take over-the-counter or prescription medicines as directed by your health care provider.  Do not take laxatives unless directed to do so by your health care provider.  Try a clear liquid diet (broth, tea, or water) as directed by your health care provider. Slowly move to a bland diet as tolerated. SEEK MEDICAL CARE IF:  You have unexplained abdominal pain.  You have abdominal pain associated with nausea or diarrhea.  You have pain when you urinate or have a bowel movement.  You experience abdominal pain that wakes you in the night.  You have abdominal pain that is worsened or improved by eating food.  You have abdominal pain that is worsened with eating fatty foods.  You have a fever. SEEK IMMEDIATE MEDICAL CARE IF:   Your pain does not go away within 2 hours.  You keep throwing up (vomiting).  Your pain is felt only in portions of the abdomen, such as the right side or the left lower portion of the abdomen.  You pass bloody or black tarry stools. MAKE SURE YOU:  Understand these instructions.   Will watch your condition.   Will get help right away if you are not doing well or get worse.  Document Released: 03/19/2005 Document Revised: 06/14/2013 Document Reviewed: 02/16/2013 [facility] Patient Information  2015 [place], [place]. This information is not intended to replace advice given to you by your health care provider. Make sure you discuss any questions you have with your health care provider. Crohn Disease Crohn disease is a long-term (chronic) soreness and redness (inflammation) of the intestines (bowel). It can affect any portion of the digestive tract, from the mouth to the anus. It can also cause problems outside the digestive tract. Crohn disease is closely related to a disease called ulcerative colitis (together, these two diseases are called inflammatory bowel disease).  CAUSES  The cause of Crohn disease is not known. One Link Snuffer is that, in an easily affected person, the immune system is triggered to attack the body's own digestive tissue. Crohn disease runs in families. It seems to be more common in certain geographic areas and amongst certain races. There are no clear-cut dietary causes.  SYMPTOMS  Crohn disease can cause many different symptoms since it can affect many different parts of the body. Symptoms include:  Fatigue.  Weight loss.  Chronic diarrhea, sometime bloody.  Abdominal pain and cramps.  Fever.  Ulcers or canker sores in the mouth or rectum.  Anemia (low red blood cells).  Arthritis, skin problems, and eye problems may occur. Complications of Crohn disease can include:  Series of holes (perforation) of the bowel.  Portions of the intestines sticking to each other (adhesions).  Obstruction of the bowel.  Fistula formation, typically in the rectal area but also in other areas. A fistula is an opening between the  bowels and the outside, or between the bowels and another organ.  A painful crack in the mucous membrane of the anus (rectal fissure). DIAGNOSIS  Your caregiver may suspect Crohn disease based on your symptoms and an exam. Blood tests may confirm that there is a problem. You may be asked to submit a stool specimen for examination. X-rays and CT scans  may be necessary. Ultimately, the diagnosis is usually made after a procedure that uses a flexible tube that is inserted via your mouth or your anus. This is done under sedation and is called either an upper endoscopy or colonoscopy. With these tests, the specialist can take tiny tissue samples and remove them from the inside of the bowel (biopsy). Examination of this biopsy tissue under a microscope can reveal Crohn disease as the cause of your symptoms. Due to the many different forms that Crohn disease can take, symptoms may be present for several years before a diagnosis is made. TREATMENT  Medications are often used to decrease inflammation and control the immune system. These include medicines related to aspirin, steroid medications, and newer and stronger medications to slow down the immune system. Some medications may be used as suppositories or enemas. A number of other medications are used or have been studied. Your caregiver will make specific recommendations. HOME CARE INSTRUCTIONS   Symptoms such as diarrhea can be controlled with medications. Avoid foods that have a laxative effect such as fresh fruit, vegetables, and dairy products. During flare-ups, you can rest your bowel by refraining from solid foods. Drink clear liquids frequently during the day. (Electrolyte or rehydrating fluids are best. Your caregiver can help you with suggestions.) Drink often to prevent loss of body fluids (dehydration). When diarrhea has cleared, eat small meals and more frequently. Avoid food additives and stimulants such as caffeine (coffee, tea, or chocolate). Enzyme supplements may help if you develop intolerance to a sugar in dairy products (lactose). Ask your caregiver or dietitian about specific dietary instructions.  Try to maintain a positive attitude. Learn relaxation techniques such as self-hypnosis, mental imaging, and muscle relaxation.  If possible, avoid stresses which can aggravate your  condition.  Exercise regularly.  Follow your diet.  Always get plenty of rest. SEEK MEDICAL CARE IF:   Your symptoms fail to improve after a week or two of new treatment.  You experience continued weight loss.  You have ongoing cramps or loose bowels.  You develop a new skin rash, skin sores, or eye problems. SEEK IMMEDIATE MEDICAL CARE IF:   You have worsening of your symptoms or develop new symptoms.  You have a fever.  You develop bloody diarrhea.  You develop severe abdominal pain. MAKE SURE YOU:   Understand these instructions.  Will watch your condition.  Will get help right away if you are not doing well or get worse. Document Released: 03/19/2005 Document Revised: 10/24/2013 Document Reviewed: 02/15/2007 [facility] Patient Information 2015 [place], [place]. This information is not intended to replace advice given to you by your health care provider. Make sure you discuss any questions you have with your health care provider.

## 2014-11-20 NOTE — ED Notes (Signed)
Upper abdominal pain with bloating for two weeks.  Some nausea.  No vomiting.  Stools alternate from loose to hard.  No known fever.  Decreased appetite.

## 2014-11-22 ENCOUNTER — Ambulatory Visit (INDEPENDENT_AMBULATORY_CARE_PROVIDER_SITE_OTHER): Payer: 59 | Admitting: Family Medicine

## 2014-11-22 ENCOUNTER — Encounter: Payer: Self-pay | Admitting: Family Medicine

## 2014-11-22 VITALS — BP 121/84 | HR 82 | Temp 99.2°F | Resp 16 | Ht 72.5 in | Wt 175.0 lb

## 2014-11-22 DIAGNOSIS — R1084 Generalized abdominal pain: Secondary | ICD-10-CM | POA: Diagnosis not present

## 2014-11-22 DIAGNOSIS — R197 Diarrhea, unspecified: Secondary | ICD-10-CM | POA: Diagnosis not present

## 2014-11-22 DIAGNOSIS — G43709 Chronic migraine without aura, not intractable, without status migrainosus: Secondary | ICD-10-CM | POA: Diagnosis not present

## 2014-11-22 DIAGNOSIS — Z720 Tobacco use: Secondary | ICD-10-CM

## 2014-11-22 DIAGNOSIS — I10 Essential (primary) hypertension: Secondary | ICD-10-CM

## 2014-11-22 DIAGNOSIS — K5090 Crohn's disease, unspecified, without complications: Secondary | ICD-10-CM | POA: Diagnosis not present

## 2014-11-22 LAB — LIPID PANEL
CHOL/HDL RATIO: 5.5 ratio
Cholesterol: 186 mg/dL (ref 0–200)
HDL: 34 mg/dL — AB (ref >=40)
LDL Cholesterol: 93 mg/dL (ref 0–99)
Triglycerides: 294 mg/dL — ABNORMAL HIGH (ref <150)
VLDL: 59 mg/dL — AB (ref 0–40)

## 2014-11-22 LAB — TSH: TSH: 0.659 u[IU]/mL (ref 0.350–4.500)

## 2014-11-22 MED ORDER — RABEPRAZOLE SODIUM 20 MG PO TBEC: 20.0000 mg | DELAYED_RELEASE_TABLET | Freq: Every day | ORAL | Status: DC

## 2014-11-22 MED ORDER — LISINOPRIL-HYDROCHLOROTHIAZIDE 20-25 MG PO TABS: 1.0000 | ORAL_TABLET | Freq: Every day | ORAL | Status: DC

## 2014-11-22 NOTE — Progress Notes (Signed)
Subjective:    Patient ID: Leroy Herrera, male    DOB: Dec 12, 1980, 34 y.o.   MRN: [id number]  11/22/2014  Follow-up and Medication Management   HPI This 34 y.o. male presents for ten month follow-up:  1.  Abdominal pain, diarrhea:  S/p ED evaluation on 11/20/14.  Onset two weeks ago.  Has horrible abdominal pain, horrible diarrhea.  Interested in local GI physician.  Last GI physician several years ago.  No CT abd/pelvis obtained during ED visit; s/p AAS.  Suffering with diarrhea and constipation. Had six stools one day; has been on clear liquids since ED visit.  Hungry now.  No fever but +chills.  Constant abdominal pain for several days. Diarrhea would alternate with 1-2 days of constipation.  Very strange. Onset of cyclic abdominal pain with diarrhea four times per year.  Last stool this morning was diarrhea.  Had five stools before work yesterday.  Much better than yesterday.  Mild nausea with eating. No vomiting.  No GERD or issues from baseline.    2. HTN: Patient reports good compliance with medication, good tolerance to medication, and good symptom control.    3.  Headaches: less frequent; getting one every two months; has decreased caffeine a lot.  Decreased Mt. Dew intake.  Still smoking.  Decreased hamburger intake.   4.  Substance abuse/heroine addiction: remaining sober.  Helping parents out a lot; father in bad health; maintaining parents house.  New baby.  Baby    Review of Systems  Constitutional: Negative for fever, chills, diaphoresis, activity change, appetite change and fatigue.  Eyes: Negative for visual disturbance.  Respiratory: Negative for cough and shortness of breath.   Cardiovascular: Negative for chest pain, palpitations and leg swelling.  Endocrine: Negative for cold intolerance, heat intolerance, polydipsia, polyphagia and polyuria.  Neurological: Negative for dizziness, tremors, seizures, syncope, facial asymmetry, speech difficulty, weakness,  light-headedness, numbness and headaches.    Past Medical History  Diagnosis Date  . Crohn's disease   . Hypertension   . Allergy   . Renal disorder     Nephrolithiasis  . Heroin addiction 06/23/2009    Remission since 2011.  Marland Kitchen Pancreatitis    Past Surgical History  Procedure Laterality Date  . Bowel resection    . Cholecystectomy    . Tonsillectomy    . Appendectomy    . Colon surgery     Allergies  Allergen Reactions  . Prednisone Anaphylaxis    High dose allergy  . Morphine And Related Hives  . Vicodin [Hydrocodone-Acetaminophen] Hives and Itching   Current Outpatient Prescriptions  Medication Sig Dispense Refill  . dicyclomine (BENTYL) 20 MG tablet Take 1 tablet (20 mg total) by mouth 2 (two) times daily. 20 tablet 0  . ibuprofen (ADVIL,MOTRIN) 200 MG tablet Take 800 mg by mouth every 6 (six) hours as needed for pain.    Marland Kitchen lisinopril-hydrochlorothiazide (PRINZIDE,ZESTORETIC) 20-25 MG per tablet Take 1 tablet by mouth daily. 30 tablet 5  . omeprazole (PRILOSEC) 40 MG capsule TAKE 1 CAPSULE (40 MG TOTAL) BY MOUTH DAILY. 90 capsule 0  . RABEprazole (ACIPHEX) 20 MG tablet Take 1 tablet (20 mg total) by mouth daily. 30 tablet 11   No current facility-administered medications for this visit.       Objective:    BP 121/84 mmHg  Pulse 82  Temp(Src) 99.2 F (37.3 C)  Resp 16  Ht 6' 0.5" (1.842 m)  Wt 175 lb (79.379 kg)  BMI 23.40 kg/m2  SpO2  100% Physical Exam  Constitutional: He is oriented to person, place, and time. He appears well-developed and well-nourished. No distress.  HENT:  Head: Normocephalic and atraumatic.  Right Ear: External ear normal.  Left Ear: External ear normal.  Nose: Nose normal.  Mouth/Throat: Oropharynx is clear and moist.  Eyes: Conjunctivae and EOM are normal. Pupils are equal, round, and reactive to light.  Neck: Normal range of motion. Neck supple. Carotid bruit is not present. No thyromegaly present.  Cardiovascular: Normal rate,  regular rhythm, normal heart sounds and intact distal pulses.  Exam reveals no gallop and no friction rub.   No murmur heard. Pulmonary/Chest: Effort normal and breath sounds normal. He has no wheezes. He has no rales.  Abdominal: Soft. Bowel sounds are normal. He exhibits no distension and no mass. There is no tenderness. There is no rebound and no guarding.  Lymphadenopathy:    He has no cervical adenopathy.  Neurological: He is alert and oriented to person, place, and time. No cranial nerve deficit.  Skin: Skin is warm and dry. No rash noted. He is not diaphoretic.  Psychiatric: He has a normal mood and affect. His behavior is normal.  Nursing note and vitals reviewed.  Results for orders placed or performed during the hospital encounter of 11/20/14  Urinalysis, Routine w reflex microscopic  Result Value Ref Range   Color, Urine YELLOW YELLOW   APPearance CLEAR CLEAR   Specific Gravity, Urine 1.020 1.005 - 1.030   pH 6.5 5.0 - 8.0   Glucose, UA NEGATIVE NEGATIVE mg/dL   Hgb urine dipstick NEGATIVE NEGATIVE   Bilirubin Urine NEGATIVE NEGATIVE   Ketones, ur NEGATIVE NEGATIVE mg/dL   Protein, ur NEGATIVE NEGATIVE mg/dL   Urobilinogen, UA 0.2 0.0 - 1.0 mg/dL   Nitrite NEGATIVE NEGATIVE   Leukocytes, UA NEGATIVE NEGATIVE  Comprehensive metabolic panel  Result Value Ref Range   Sodium 140 135 - 145 mmol/L   Potassium 3.8 3.5 - 5.1 mmol/L   Chloride 103 101 - 111 mmol/L   CO2 25 22 - 32 mmol/L   Glucose, Bld 106 (H) 65 - 99 mg/dL   BUN 10 6 - 20 mg/dL   Creatinine, Ser 0.97 0.61 - 1.24 mg/dL   Calcium 10.1 8.9 - 10.3 mg/dL   Total Protein 7.3 6.5 - 8.1 g/dL   Albumin 4.4 3.5 - 5.0 g/dL   AST 31 15 - 41 U/L   ALT 47 17 - 63 U/L   Alkaline Phosphatase 109 38 - 126 U/L   Total Bilirubin 0.4 0.3 - 1.2 mg/dL   GFR calc non Af Amer >60 >60 mL/min   GFR calc Af Amer >60 >60 mL/min   Anion gap 12 5 - 15  Lipase, blood  Result Value Ref Range   Lipase 24 22 - 51 U/L  CBC with  Differential  Result Value Ref Range   WBC 9.4 4.0 - 10.5 K/uL   RBC 5.38 4.22 - 5.81 MIL/uL   Hemoglobin 15.9 13.0 - 17.0 g/dL   HCT 45.9 39.0 - 52.0 %   MCV 85.3 78.0 - 100.0 fL   MCH 29.6 26.0 - 34.0 pg   MCHC 34.6 30.0 - 36.0 g/dL   RDW 12.1 11.5 - 15.5 %   Platelets 206 150 - 400 K/uL   Neutrophils Relative % 76 43 - 77 %   Neutro Abs 7.3 1.7 - 7.7 K/uL   Lymphocytes Relative 15 12 - 46 %   Lymphs Abs 1.4 0.7 -  4.0 K/uL   Monocytes Relative 7 3 - 12 %   Monocytes Absolute 0.6 0.1 - 1.0 K/uL   Eosinophils Relative 1 0 - 5 %   Eosinophils Absolute 0.1 0.0 - 0.7 K/uL   Basophils Relative 1 0 - 1 %   Basophils Absolute 0.1 0.0 - 0.1 K/uL  Occult blood card to lab, stool Provider will collect  Result Value Ref Range   Fecal Occult Bld NEGATIVE NEGATIVE       Assessment & Plan:   1. Generalized abdominal pain   2. Diarrhea   3. Crohn's disease, without complications   4. Essential hypertension, benign   5. Tobacco abuse   6. Chronic migraine without aura without status migrainosus, not intractable     Meds ordered this encounter  Medications  . RABEprazole (ACIPHEX) 20 MG tablet    Sig: Take 1 tablet (20 mg total) by mouth daily.    Dispense:  30 tablet    Refill:  11  . lisinopril-hydrochlorothiazide (PRINZIDE,ZESTORETIC) 20-25 MG per tablet    Sig: Take 1 tablet by mouth daily.    Dispense:  30 tablet    Refill:  5    Return in about 6 months (around 05/24/2015) for complete physical examiniation.     Gared Gillie Elayne Guerin, M.D. [facility] [address] [place],   [address] [phone] phone [phone] fax

## 2014-11-22 NOTE — Patient Instructions (Signed)
Nicotine Addiction Nicotine can act as both a stimulant (excites/activates) and a sedative (calms/quiets). Immediately after exposure to nicotine, there is a "kick" caused in part by the drug's stimulation of the adrenal glands and resulting discharge of adrenaline (epinephrine). The rush of adrenaline stimulates the body and causes a sudden release of sugar. This means that smokers are always slightly hyperglycemic. Hyperglycemic means that the blood sugar is high, just like in diabetics. Nicotine also decreases the amount of insulin which helps control sugar levels in the body. There is an increase in blood pressure, breathing, and the rate of heart beats.  In addition, nicotine indirectly causes a release of dopamine in the brain that controls pleasure and motivation. A similar reaction is seen with other drugs of abuse, such as cocaine and heroin. This dopamine release is thought to cause the pleasurable sensations when smoking. In some different cases, nicotine can also create a calming effect, depending on sensitivity of the smoker's nervous system and the dose of nicotine taken. WHAT HAPPENS WHEN NICOTINE IS TAKEN FOR LONG PERIODS OF TIME?  Long-term use of nicotine results in addiction. It is difficult to stop.  Repeated use of nicotine creates tolerance. Higher doses of nicotine are needed to get the "kick." When nicotine use is stopped, withdrawal may last a month or more. Withdrawal may begin within a few hours after the last cigarette. Symptoms peak within the first few days and may lessen within a few weeks. For some people, however, symptoms may last for months or longer. Withdrawal symptoms include:   Irritability.  Craving.  Learning and attention deficits.  Sleep disturbances.  Increased appetite. Craving for tobacco may last for 6 months or longer. Many behaviors done while using nicotine can also play a part in the severity of withdrawal symptoms. For some people, the feel,  smell, and sight of a cigarette and the ritual of obtaining, handling, lighting, and smoking the cigarette are closely linked with the pleasure of smoking. When stopped, they also miss the related behaviors which make the withdrawal or craving worse. While nicotine gum and patches may lessen the drug aspects of withdrawal, cravings often persist. WHAT ARE THE MEDICAL CONSEQUENCES OF NICOTINE USE?  Nicotine addiction accounts for one-third of all cancers. The top cancer caused by tobacco is lung cancer. Lung cancer is the number one cancer killer of both men and women.  Smoking is also associated with cancers of the:  Mouth.  Pharynx.  Larynx.  Esophagus.  Stomach.  Pancreas.  Cervix.  Kidney.  Ureter.  Bladder.  Smoking also causes lung diseases such as lasting (chronic) bronchitis and emphysema.  It worsens asthma in adults and children.  Smoking increases the risk of heart disease, including:  Stroke.  Heart attack.  Vascular disease.  Aneurysm.  Passive or secondary smoke can also increase medical risks including:  Asthma in children.  Sudden Infant Death Syndrome (SIDS).  Additionally, dropped cigarettes are the leading cause of residential fire fatalities.  Nicotine poisoning has been reported from accidental ingestion of tobacco products by children and pets. Death usually results in a few minutes from respiratory failure (when a person stops breathing) caused by paralysis. TREATMENT   Medication. Nicotine replacement medicines such as nicotine gum and the patch are used to stop smoking. These medicines gradually lower the dosage of nicotine in the body. These medicines do not contain the carbon monoxide and other toxins found in tobacco smoke.  Hypnotherapy.  Relaxation therapy.  Nicotine Anonymous (a 12-step support  program). Find times and locations in your local yellow pages. Document Released: 02/13/2004 Document Revised: 09/01/2011 Document  Reviewed: 08/05/2013 [facility] Patient Information 2015 [place], [place]. This information is not intended to replace advice given to you by your health care provider. Make sure you discuss any questions you have with your health care provider.

## 2014-11-23 LAB — SEDIMENTATION RATE: Sed Rate: 1 mm/hr (ref 0–15)

## 2014-11-28 ENCOUNTER — Telehealth: Payer: Self-pay

## 2014-11-28 NOTE — Telephone Encounter (Signed)
PA completed on covermymeds and received approval, case # [id number]. Notified pharm and pt on VM.

## 2014-11-29 ENCOUNTER — Other Ambulatory Visit: Payer: Self-pay | Admitting: Family Medicine

## 2015-02-02 ENCOUNTER — Emergency Department (HOSPITAL_BASED_OUTPATIENT_CLINIC_OR_DEPARTMENT_OTHER)
Admission: EM | Admit: 2015-02-02 | Discharge: 2015-02-03 | Discharge status: Against Medical Advice | Payer: 59 | Attending: Emergency Medicine | Admitting: Emergency Medicine

## 2015-02-02 ENCOUNTER — Encounter (HOSPITAL_BASED_OUTPATIENT_CLINIC_OR_DEPARTMENT_OTHER): Payer: Self-pay | Admitting: Emergency Medicine

## 2015-02-02 ENCOUNTER — Emergency Department (HOSPITAL_BASED_OUTPATIENT_CLINIC_OR_DEPARTMENT_OTHER): Payer: 59

## 2015-02-02 DIAGNOSIS — Z87442 Personal history of urinary calculi: Secondary | ICD-10-CM | POA: Diagnosis not present

## 2015-02-02 DIAGNOSIS — L089 Local infection of the skin and subcutaneous tissue, unspecified: Secondary | ICD-10-CM | POA: Diagnosis present

## 2015-02-02 DIAGNOSIS — K5090 Crohn's disease, unspecified, without complications: Secondary | ICD-10-CM | POA: Diagnosis not present

## 2015-02-02 DIAGNOSIS — Z720 Tobacco use: Secondary | ICD-10-CM | POA: Diagnosis not present

## 2015-02-02 DIAGNOSIS — L03312 Cellulitis of back [any part except buttock]: Secondary | ICD-10-CM | POA: Insufficient documentation

## 2015-02-02 DIAGNOSIS — I10 Essential (primary) hypertension: Secondary | ICD-10-CM | POA: Diagnosis not present

## 2015-02-02 DIAGNOSIS — L02212 Cutaneous abscess of back [any part, except buttock]: Secondary | ICD-10-CM | POA: Diagnosis not present

## 2015-02-02 DIAGNOSIS — L0291 Cutaneous abscess, unspecified: Secondary | ICD-10-CM

## 2015-02-02 DIAGNOSIS — Z79899 Other long term (current) drug therapy: Secondary | ICD-10-CM | POA: Diagnosis not present

## 2015-02-02 LAB — CBC WITH DIFFERENTIAL/PLATELET
BASOS PCT: 0 % (ref 0–1)
Basophils Absolute: 0 10*3/uL (ref 0.0–0.1)
EOS ABS: 0.2 10*3/uL (ref 0.0–0.7)
EOS PCT: 2 % (ref 0–5)
HEMATOCRIT: 38.2 % — AB (ref 39.0–52.0)
HEMOGLOBIN: 13 g/dL (ref 13.0–17.0)
Lymphocytes Relative: 16 % (ref 12–46)
Lymphs Abs: 1.8 10*3/uL (ref 0.7–4.0)
MCH: 29.9 pg (ref 26.0–34.0)
MCHC: 34 g/dL (ref 30.0–36.0)
MCV: 87.8 fL (ref 78.0–100.0)
Monocytes Absolute: 1.2 10*3/uL — ABNORMAL HIGH (ref 0.1–1.0)
Monocytes Relative: 11 % (ref 3–12)
NEUTROS PCT: 71 % (ref 43–77)
Neutro Abs: 7.7 10*3/uL (ref 1.7–7.7)
PLATELETS: 171 10*3/uL (ref 150–400)
RBC: 4.35 MIL/uL (ref 4.22–5.81)
RDW: 12.5 % (ref 11.5–15.5)
WBC: 10.9 10*3/uL — AB (ref 4.0–10.5)

## 2015-02-02 MED ORDER — VANCOMYCIN HCL IN DEXTROSE 1-5 GM/200ML-% IV SOLN
1000.0000 mg | Freq: Once | INTRAVENOUS | Status: AC
  Administered 2015-02-03: 1000 mg via INTRAVENOUS
  Filled 2015-02-02: qty 200

## 2015-02-02 MED ORDER — PIPERACILLIN-TAZOBACTAM 3.375 G IVPB 30 MIN
3.3750 g | Freq: Once | INTRAVENOUS | Status: AC
  Administered 2015-02-03: 3.375 g via INTRAVENOUS
  Filled 2015-02-02 (×2): qty 50

## 2015-02-02 MED ORDER — KETOROLAC TROMETHAMINE 30 MG/ML IJ SOLN
30.0000 mg | Freq: Once | INTRAMUSCULAR | Status: DC
Start: 2015-02-03 — End: 2015-02-03
  Filled 2015-02-02: qty 1

## 2015-02-02 NOTE — ED Provider Notes (Addendum)
CSN: [id number]     Arrival date & time 02/02/15  2157 History  This chart was scribed for Tarae Wooden, MD by Rayna Sexton, ED scribe. This patient was seen in room MH01/MH01 and the patient's care was started at 11:53 PM.   Chief Complaint  Patient presents with  . Recurrent Skin Infections   Patient is a 34 y.o. male presenting with back pain. The history is provided by the patient. No language interpreter was used.  Back Pain Location:  Lumbar spine Quality:  Aching Radiates to:  Does not radiate Pain severity:  Moderate Pain is:  Same all the time Onset quality:  Gradual Duration:  1 week Timing:  Constant Progression:  Worsening Chronicity:  New Context: not physical stress   Relieved by:  Nothing Worsened by:  Palpation Ineffective treatments:  None tried Associated symptoms: no abdominal pain, no abdominal swelling, no bladder incontinence, no bowel incontinence, no chest pain, no dysuria, no fever, no headaches, no leg pain, no numbness, no pelvic pain, no perianal numbness, no tingling, no weakness and no weight loss   Risk factors: no hx of osteoporosis     HPI Comments: Leroy Herrera is a 34 y.o. male, with a hx of Crohn's Disease, who presents to the Emergency Department complaining of a point of swelling to his lower back with abscess and cellulitis with a worsening of his symptoms beginning a week ago. He notes associated, moderate, pain to the affected area that worsens with palpation. He notes a recent colonoscopy which they went through with on Tuesday despite having the abscess present on the lower back.  His tetanus vaccination is UTD. Pt denies fever, chills, nausea, vomiting.   Past Medical History  Diagnosis Date  . Crohn's disease   . Hypertension   . Allergy   . Renal disorder     Nephrolithiasis  . Heroin addiction 06/23/2009    Remission since 2011.  Marland Kitchen Pancreatitis    Past Surgical History  Procedure Laterality Date  . Bowel resection    .  Cholecystectomy    . Tonsillectomy    . Appendectomy    . Colon surgery     No family history on file. Social History  Substance Use Topics  . Smoking status: Current Every Day Smoker -- 1.50 packs/day  . Smokeless tobacco: Never Used  . Alcohol Use: Yes     Comment: occasional    Review of Systems  Constitutional: Negative for fever and weight loss.  Cardiovascular: Negative for chest pain.  Gastrointestinal: Negative for abdominal pain and bowel incontinence.  Genitourinary: Negative for bladder incontinence, dysuria and pelvic pain.  Musculoskeletal: Positive for back pain.  Skin: Positive for color change and wound.  Neurological: Negative for tingling, weakness, numbness and headaches.  All other systems reviewed and are negative.   Allergies  Prednisone; Morphine and related; and Vicodin  Home Medications   Prior to Admission medications   Medication Sig Start Date End Date Taking? Authorizing Provider  dicyclomine (BENTYL) 20 MG tablet Take 1 tablet (20 mg total) by mouth 2 (two) times daily. 11/20/14   Charlesetta Shanks, MD  ibuprofen (ADVIL,MOTRIN) 200 MG tablet Take 800 mg by mouth every 6 (six) hours as needed for pain.    Historical Provider, MD  lisinopril-hydrochlorothiazide (PRINZIDE,ZESTORETIC) 20-25 MG per tablet Take 1 tablet by mouth daily. 11/22/14   Wardell Honour, MD  omeprazole (PRILOSEC) 40 MG capsule TAKE 1 CAPSULE (40 MG TOTAL) BY MOUTH DAILY. 11/13/14  Wardell Honour, MD  RABEprazole (ACIPHEX) 20 MG tablet Take 1 tablet (20 mg total) by mouth daily. 11/22/14   Wardell Honour, MD   BP 131/86 mmHg  Pulse 81  Temp(Src) 98.4 F (36.9 C) (Oral)  Resp 18  Ht 6' (1.829 m)  Wt 175 lb (79.379 kg)  BMI 23.73 kg/m2  SpO2 100% Physical Exam  Constitutional: He is oriented to person, place, and time. He appears well-developed and well-nourished. No distress.  HENT:  Head: Normocephalic and atraumatic.  Mouth/Throat: Oropharynx is clear and moist.  Eyes:  Conjunctivae are normal. Pupils are equal, round, and reactive to light.  Neck: Normal range of motion. Neck supple. No tracheal deviation present.  Cardiovascular: Normal rate, regular rhythm, normal heart sounds and intact distal pulses.  Exam reveals no gallop and no friction rub.   No murmur heard. Pulmonary/Chest: Effort normal and breath sounds normal. No respiratory distress. He has no wheezes. He has no rales. He exhibits no tenderness.  Abdominal: Soft. Bowel sounds are normal. There is no tenderness. There is no rebound and no guarding.  Musculoskeletal: Normal range of motion. He exhibits no edema or tenderness.  Neurological: He is alert and oriented to person, place, and time. He has normal reflexes.  Skin: Skin is warm and dry.     L5/S1 region with surrounding cellulitis; 1" x 1" circular with multiple heads; surrounding warmth and erythema across lower back and onto the flanks that's about 3"; drainage present; No step offs or crepitus of C, T or L-spine  Psychiatric: He has a normal mood and affect.  Nursing note and vitals reviewed.  ED Course  Procedures  DIAGNOSTIC STUDIES: Oxygen Saturation is 100% on RA, normal by my interpretation.    COORDINATION OF CARE: 11:56 PM Discussed treatment plan with pt at bedside and pt agreed to plan.  Labs Review Labs Reviewed  CULTURE, BLOOD (ROUTINE X 2)  CULTURE, BLOOD (ROUTINE X 2)  CBC WITH DIFFERENTIAL/PLATELET  BASIC METABOLIC PANEL    Imaging Review No results found. I, Rayna Sexton, personally reviewed and evaluated these images and lab results as part of my medical decision-making.   EKG Interpretation None      MDM   Final diagnoses:  None   Results for orders placed or performed during the hospital encounter of 02/02/15  CBC with Differential/Platelet  Result Value Ref Range   WBC 10.9 (H) 4.0 - 10.5 K/uL   RBC 4.35 4.22 - 5.81 MIL/uL   Hemoglobin 13.0 13.0 - 17.0 g/dL   HCT 38.2 (L) 39.0 - 52.0 %    MCV 87.8 78.0 - 100.0 fL   MCH 29.9 26.0 - 34.0 pg   MCHC 34.0 30.0 - 36.0 g/dL   RDW 12.5 11.5 - 15.5 %   Platelets 171 150 - 400 K/uL   Neutrophils Relative % 71 43 - 77 %   Neutro Abs 7.7 1.7 - 7.7 K/uL   Lymphocytes Relative 16 12 - 46 %   Lymphs Abs 1.8 0.7 - 4.0 K/uL   Monocytes Relative 11 3 - 12 %   Monocytes Absolute 1.2 (H) 0.1 - 1.0 K/uL   Eosinophils Relative 2 0 - 5 %   Eosinophils Absolute 0.2 0.0 - 0.7 K/uL   Basophils Relative 0 0 - 1 %   Basophils Absolute 0.0 0.0 - 0.1 K/uL  Basic metabolic panel  Result Value Ref Range   Sodium 137 135 - 145 mmol/L   Potassium 3.6 3.5 - 5.1  mmol/L   Chloride 104 101 - 111 mmol/L   CO2 23 22 - 32 mmol/L   Glucose, Bld 90 65 - 99 mg/dL   BUN 15 6 - 20 mg/dL   Creatinine, Ser 0.99 0.61 - 1.24 mg/dL   Calcium 9.1 8.9 - 10.3 mg/dL   GFR calc non Af Amer >60 >60 mL/min   GFR calc Af Amer >60 >60 mL/min   Anion gap 10 5 - 15   No results found.  Medications  ketorolac (TORADOL) 30 MG/ML injection 30 mg (30 mg Intravenous Not Given 02/03/15 0025)  iohexol (OMNIPAQUE) 300 MG/ML solution 100 mL (not administered)  vancomycin (VANCOCIN) IVPB 1000 mg/200 mL premix (0 mg Intravenous Stopped 02/03/15 0212)  piperacillin-tazobactam (ZOSYN) IVPB 3.375 g (0 g Intravenous Stopped 02/03/15 0212)    Plan IV antibiotics, CT to see extent of lesion and admission for IV antibiotics and surgery consult.    Refused CT scan and admission  Patient is AO4 and has decision making capacity to refuse care.  He verbalizes understanding of all risks and benefits (of admission) with nurse Katie and family present that the risks of leaving AMA are but are not limited to: death, coma, sepsis, paralysis, pain and prolonged morbidity.  He states he understands this but does not want admission at this time.  He signs AMA.  He is welcomed to return at any time  I personally performed the services described in this documentation, which was scribed in my  presence. The recorded information has been reviewed and is accurate.     Veatrice Kells, MD 02/03/15 0216  Khristin Keleher, MD 02/03/15 [phone]

## 2015-02-02 NOTE — ED Notes (Signed)
Patient states that he has a lower back cyst that is infected.

## 2015-02-03 ENCOUNTER — Encounter (HOSPITAL_COMMUNITY): Payer: Self-pay

## 2015-02-03 ENCOUNTER — Emergency Department (HOSPITAL_COMMUNITY)
Admission: EM | Admit: 2015-02-03 | Discharge: 2015-02-03 | Disposition: A | Discharge status: Home / Self Care | Payer: 59 | Source: Home / Self Care | Attending: Emergency Medicine | Admitting: Emergency Medicine

## 2015-02-03 ENCOUNTER — Emergency Department (HOSPITAL_COMMUNITY): Payer: 59

## 2015-02-03 ENCOUNTER — Encounter (HOSPITAL_BASED_OUTPATIENT_CLINIC_OR_DEPARTMENT_OTHER): Payer: Self-pay | Admitting: Emergency Medicine

## 2015-02-03 DIAGNOSIS — I10 Essential (primary) hypertension: Secondary | ICD-10-CM

## 2015-02-03 DIAGNOSIS — L723 Sebaceous cyst: Secondary | ICD-10-CM | POA: Insufficient documentation

## 2015-02-03 DIAGNOSIS — Z79899 Other long term (current) drug therapy: Secondary | ICD-10-CM | POA: Insufficient documentation

## 2015-02-03 DIAGNOSIS — Z87448 Personal history of other diseases of urinary system: Secondary | ICD-10-CM

## 2015-02-03 DIAGNOSIS — L0291 Cutaneous abscess, unspecified: Secondary | ICD-10-CM

## 2015-02-03 DIAGNOSIS — L089 Local infection of the skin and subcutaneous tissue, unspecified: Secondary | ICD-10-CM

## 2015-02-03 DIAGNOSIS — Z720 Tobacco use: Secondary | ICD-10-CM | POA: Insufficient documentation

## 2015-02-03 DIAGNOSIS — L02212 Cutaneous abscess of back [any part, except buttock]: Secondary | ICD-10-CM

## 2015-02-03 LAB — CBC WITH DIFFERENTIAL/PLATELET
Basophils Absolute: 0 10*3/uL (ref 0.0–0.1)
Basophils Relative: 0 % (ref 0–1)
EOS PCT: 2 % (ref 0–5)
Eosinophils Absolute: 0.1 10*3/uL (ref 0.0–0.7)
HCT: 39.1 % (ref 39.0–52.0)
HEMOGLOBIN: 13.5 g/dL (ref 13.0–17.0)
Lymphocytes Relative: 18 % (ref 12–46)
Lymphs Abs: 1.7 10*3/uL (ref 0.7–4.0)
MCH: 30.3 pg (ref 26.0–34.0)
MCHC: 34.5 g/dL (ref 30.0–36.0)
MCV: 87.7 fL (ref 78.0–100.0)
MONO ABS: 0.8 10*3/uL (ref 0.1–1.0)
MONOS PCT: 9 % (ref 3–12)
Neutro Abs: 6.4 10*3/uL (ref 1.7–7.7)
Neutrophils Relative %: 71 % (ref 43–77)
Platelets: 204 10*3/uL (ref 150–400)
RBC: 4.46 MIL/uL (ref 4.22–5.81)
RDW: 12.7 % (ref 11.5–15.5)
WBC: 9.1 10*3/uL (ref 4.0–10.5)

## 2015-02-03 LAB — BASIC METABOLIC PANEL
Anion gap: 10 (ref 5–15)
Anion gap: 9 (ref 5–15)
BUN: 11 mg/dL (ref 6–20)
BUN: 15 mg/dL (ref 6–20)
CHLORIDE: 105 mmol/L (ref 101–111)
CO2: 23 mmol/L (ref 22–32)
CO2: 23 mmol/L (ref 22–32)
Calcium: 8.9 mg/dL (ref 8.9–10.3)
Calcium: 9.1 mg/dL (ref 8.9–10.3)
Chloride: 104 mmol/L (ref 101–111)
Creatinine, Ser: 0.98 mg/dL (ref 0.61–1.24)
Creatinine, Ser: 0.99 mg/dL (ref 0.61–1.24)
GFR calc Af Amer: >60 mL/min (ref >60)
GFR calc non Af Amer: >60 mL/min (ref >60)
GFR calc non Af Amer: >60 mL/min (ref >60)
GLUCOSE: 90 mg/dL (ref 65–99)
Glucose, Bld: 87 mg/dL (ref 65–99)
Potassium: 3.6 mmol/L (ref 3.5–5.1)
Potassium: 3.9 mmol/L (ref 3.5–5.1)
Sodium: 137 mmol/L (ref 135–145)
Sodium: 137 mmol/L (ref 135–145)

## 2015-02-03 LAB — I-STAT CG4 LACTIC ACID, ED
LACTIC ACID, VENOUS: 1.41 mmol/L (ref 0.5–2.0)
LACTIC ACID, VENOUS: 0.36 mmol/L — AB (ref 0.5–2.0)

## 2015-02-03 MED ORDER — FENTANYL CITRATE (PF) 100 MCG/2ML IJ SOLN
50.0000 ug | Freq: Once | INTRAMUSCULAR | Status: AC
  Administered 2015-02-03: 50 ug via INTRAVENOUS
  Filled 2015-02-03: qty 2

## 2015-02-03 MED ORDER — CLINDAMYCIN HCL 150 MG PO CAPS: 150.0000 mg | ORAL_CAPSULE | Freq: Four times a day (QID) | ORAL | Status: DC

## 2015-02-03 MED ORDER — OXYCODONE-ACETAMINOPHEN 5-325 MG PO TABS: 1.0000 | ORAL_TABLET | Freq: Four times a day (QID) | ORAL | Status: DC | PRN

## 2015-02-03 MED ORDER — PIPERACILLIN-TAZOBACTAM 3.375 G IVPB
3.3750 g | Freq: Once | INTRAVENOUS | Status: DC
Start: 2015-02-03 — End: 2015-02-03
  Filled 2015-02-03: qty 50

## 2015-02-03 MED ORDER — IOHEXOL 300 MG/ML  SOLN: 100.0000 mL | Freq: Once | INTRAMUSCULAR | Status: DC | PRN

## 2015-02-03 MED ORDER — SODIUM CHLORIDE 0.9 % IV BOLUS (SEPSIS)
1000.0000 mL | Freq: Once | INTRAVENOUS | Status: AC
  Administered 2015-02-03: 1000 mL via INTRAVENOUS

## 2015-02-03 MED ORDER — VANCOMYCIN HCL IN DEXTROSE 1-5 GM/200ML-% IV SOLN
1000.0000 mg | Freq: Once | INTRAVENOUS | Status: AC
  Administered 2015-02-03: 1000 mg via INTRAVENOUS
  Filled 2015-02-03: qty 200

## 2015-02-03 MED ORDER — LIDOCAINE-EPINEPHRINE (PF) 2 %-1:200000 IJ SOLN
20.0000 mL | Freq: Once | INTRAMUSCULAR | Status: AC
  Administered 2015-02-03: 20 mL
  Filled 2015-02-03: qty 20

## 2015-02-03 MED ORDER — IOHEXOL 300 MG/ML  SOLN
100.0000 mL | Freq: Once | INTRAMUSCULAR | Status: AC | PRN
  Administered 2015-02-03: 100 mL via INTRAVENOUS

## 2015-02-03 MED ORDER — ONDANSETRON HCL 4 MG/2ML IJ SOLN
4.0000 mg | Freq: Once | INTRAMUSCULAR | Status: AC
  Administered 2015-02-03: 4 mg via INTRAVENOUS
  Filled 2015-02-03: qty 2

## 2015-02-03 MED ORDER — ONDANSETRON HCL 4 MG PO TABS: 4.0000 mg | ORAL_TABLET | Freq: Four times a day (QID) | ORAL | Status: DC

## 2015-02-03 MED ORDER — PIPERACILLIN-TAZOBACTAM 3.375 G IVPB 30 MIN
3.3750 g | Freq: Once | INTRAVENOUS | Status: AC
  Administered 2015-02-03: 3.375 g via INTRAVENOUS

## 2015-02-03 NOTE — Discharge Instructions (Signed)

## 2015-02-03 NOTE — ED Provider Notes (Signed)
CSN: [id number]     Arrival date & time 02/03/15  1419 History   First MD Initiated Contact with Patient 02/03/15 1736     Chief Complaint  Patient presents with  . Abscess     (Consider location/radiation/quality/duration/timing/severity/associated sxs/prior Treatment) HPI    PCP: SMITH,KRISTI, MD Blood pressure 155/93, pulse 85, temperature 98.8 F (37.1 C), temperature source Oral, resp. rate 16, height 6' 1"  (1.854 m), weight 177 lb 12.8 oz (80.65 kg), SpO2 100 %.  Leroy Herrera is a 34 y.o.male with a significant PMH of hx of IV drug use- heroin (last use was 7 years ago), Crohn's disease, hypertension, renal disorder, pancreatitis presents to the ER with complaints of infected cyst to lower back. Patient was seen at [facility] last night and was supposed to get a CT scan of lumbar spine w / to evaluate for involvement of the spine, IV abx and transfer to [facility] for surgical consultation. At that time he was concerned about the cost of transfer to [facility] and he had his daughter and there friend at home and was concerned about them being home alone overnight. He is back today to finish the work up/ consults. He denies worsening or improvement on symptoms.  The patient denies diaphoresis, fever, headache, weakness (general or focal), confusion, change of vision,  neck pain, dysphagia, aphagia, chest pain, shortness of breath,  abdominal pains, nausea, vomiting, diarrhea, lower extremity swelling.   Past Medical History  Diagnosis Date  . Crohn's disease   . Hypertension   . Allergy   . Renal disorder     Nephrolithiasis  . Heroin addiction 06/23/2009    Remission since 2011.  Marland Kitchen Pancreatitis    Past Surgical History  Procedure Laterality Date  . Bowel resection    . Cholecystectomy    . Tonsillectomy    . Appendectomy    . Colon surgery     History reviewed. No pertinent family history. Social History  Substance Use Topics  . Smoking status: Current Every Day Smoker --  1.50 packs/day  . Smokeless tobacco: Never Used  . Alcohol Use: Yes     Comment: occasional    Review of Systems  10 Systems reviewed and are negative for acute change except as noted in the HPI.     Allergies  Prednisone; Vicodin; and Morphine and related  Home Medications   Prior to Admission medications   Medication Sig Start Date End Date Taking? Authorizing Provider  ibuprofen (ADVIL,MOTRIN) 200 MG tablet Take 400 mg by mouth every 6 (six) hours as needed.   Yes Historical Provider, MD  lisinopril-hydrochlorothiazide (PRINZIDE,ZESTORETIC) 20-25 MG per tablet Take 1 tablet by mouth daily. 11/22/14  Yes Wardell Honour, MD  neomycin-bacitracin-polymyxin (NEOSPORIN) OINT Apply 1 application topically daily as needed for irritation or wound care.   Yes Historical Provider, MD  RABEprazole (ACIPHEX) 20 MG tablet Take 1 tablet (20 mg total) by mouth daily. 11/22/14  Yes Wardell Honour, MD  ranitidine (ZANTAC) 150 MG tablet Take 150 mg by mouth daily as needed for heartburn.   Yes Historical Provider, MD  clindamycin (CLEOCIN) 150 MG capsule Take 1 capsule (150 mg total) by mouth every 6 (six) hours. 02/03/15   Pearl Bents Carlota Raspberry, PA-C  dicyclomine (BENTYL) 20 MG tablet Take 1 tablet (20 mg total) by mouth 2 (two) times daily. 11/20/14   Charlesetta Shanks, MD  omeprazole (PRILOSEC) 40 MG capsule TAKE 1 CAPSULE (40 MG TOTAL) BY MOUTH DAILY. 11/13/14  Wardell Honour, MD  ondansetron (ZOFRAN) 4 MG tablet Take 1 tablet (4 mg total) by mouth every 6 (six) hours. 02/03/15   Delos Haring, PA-C  oxyCODONE-acetaminophen (PERCOCET/ROXICET) 5-325 MG per tablet Take 1 tablet by mouth every 6 (six) hours as needed for severe pain. 02/03/15   Halona Amstutz Carlota Raspberry, PA-C   BP 133/91 mmHg  Pulse 82  Temp(Src) 98.8 F (37.1 C) (Oral)  Resp 20  Ht 6' 1"  (1.854 m)  Wt 177 lb 12.8 oz (80.65 kg)  BMI 23.46 kg/m2  SpO2 100% Physical Exam  Constitutional: He appears well-developed and well-nourished. No distress.  HENT:    Head: Normocephalic and atraumatic.  Eyes: Pupils are equal, round, and reactive to light.  Neck: Normal range of motion. Neck supple.  Cardiovascular: Normal rate and regular rhythm.   Pulmonary/Chest: Effort normal.  Abdominal: Soft. Bowel sounds are normal. There is no tenderness. There is no rigidity, no rebound and no guarding.  Musculoskeletal:       Arms: Neurological: He is alert.  Skin: Skin is warm and dry.  2 x 2 cm abscess with multiple heads that are open and draining white/yellowish puss.  There is associated surrounding erythema and induration around abscess that extends out approx 4-6 cm. There is significant firmness and tenderness associated with the wound that extends out approx 10 cm overlying the spine.  Nursing note and vitals reviewed.   ED Course  Procedures (including critical care time) Labs Review Labs Reviewed  I-STAT CG4 LACTIC ACID, ED - Abnormal; Notable for the following:    Lactic Acid, Venous 0.36 (*)    All other components within normal limits  CBC WITH DIFFERENTIAL/PLATELET  BASIC METABOLIC PANEL  I-STAT CG4 LACTIC ACID, ED    Imaging Review Ct Lumbar Spine W Contrast  02/03/2015   CLINICAL DATA:  Abscess. Focal subcutaneous lesion in the midline with drainage.  EXAM: CT LUMBAR SPINE WITH CONTRAST  TECHNIQUE: Multidetector CT imaging of the lumbar spine was performed with intravenous contrast administration. Multiplanar CT image reconstructions were also generated.  CONTRAST:  159m OMNIPAQUE IOHEXOL 300 MG/ML  SOLN  COMPARISON:  CT abdomen and pelvis with contrast 09/12/2012.  FINDINGS: Previously noted subcutaneous collection at the level of the sacrum demonstrates marked inflammatory change. There is drainage to the skin surface. Diffuse subcutaneous edema is present. There is no deep drainage.  Cholecystectomy is noted. The kidneys and ureters are within normal limits. The visualized bowel is unremarkable. Early alcoholic anastomosis is  present. There is no significant adenopathy or free fluid.  Bone windows are unremarkable.  IMPRESSION: 1. 2.1 x 2.1 x 1.9 cm thick walled draining subcutaneous abscess at the level of the sacrum in the midline. A previous well-defined subcutaneous lesion was noted at the same level, likely a sebaceous cyst which has subsequently become infected. 2. No acute inter abdominal process. 3. No deep drainage or extension of the abscess.   Electronically Signed   By: CSan MorelleM.D.   On: 02/03/2015 19:55   I, GLinus Mako personally reviewed and evaluated these images and lab results as part of my medical decision-making.   EKG Interpretation None      MDM   Final diagnoses:  Abscess  Infected sebaceous cyst    Will repeat labs, start IV abx and get CT lumbar spine w/contrast to access the extensiveness of the infection.  The patient CT scan is a small 2 x 2 x 2 cm thick-walled sebaceous cyst that is infected. No  deep drainage or extension of the abscess. The patient's CBC, BMP and lactic acid are unremarkable.  INCISION AND DRAINAGE Performed by: Linus Mako Consent: Verbal consent obtained. Risks and benefits: risks, benefits and alternatives were discussed Type: abscess  Body area: lumbar  Anesthesia: local infiltration  A " T " shaped Incision was made with a scalpel.  Local anesthetic: lidocaine 2% with epinephrine  Anesthetic total: 10 ml  Complexity: complex Blunt dissection to break up loculations  Drainage: purulent  Drainage amount: moderate  Packing material: 1/4 in iodoform gauze  Patient tolerance: Patient did not tolerate the procedure well. Despite IV abx and significant numbing medication he was still very tender. When breaking up loculations he did not sit very still. A lot of puss was drained but there is concern that more puss may still be there. Advised to use warm soaks, packing placed, he has had two rounds of IV Vanc and Zosyn in the  past two days.  Will have him return Monday morning for a wound recheck, to packing placed. He has no systemic symptoms. Return precautions given.  Medications  vancomycin (VANCOCIN) IVPB 1000 mg/200 mL premix (0 mg Intravenous Stopped 02/03/15 2054)  sodium chloride 0.9 % bolus 1,000 mL (0 mLs Intravenous Stopped 02/03/15 1947)  ondansetron (ZOFRAN) injection 4 mg (4 mg Intravenous Given 02/03/15 1848)  fentaNYL (SUBLIMAZE) injection 50 mcg (50 mcg Intravenous Given 02/03/15 1849)  piperacillin-tazobactam (ZOSYN) IVPB 3.375 g (0 g Intravenous Stopped 02/03/15 1928)  iohexol (OMNIPAQUE) 300 MG/ML solution 100 mL (100 mLs Intravenous Contrast Given 02/03/15 1928)  lidocaine-EPINEPHrine (XYLOCAINE W/EPI) 2 %-1:200000 (PF) injection 20 mL (20 mLs Other Given 02/03/15 2122)  fentaNYL (SUBLIMAZE) injection 50 mcg (50 mcg Intravenous Given 02/03/15 2122)   Rx: Percocets #15 tabs, Clindamycin and Zofran 33 y.o.Caroline Sauger Carns's evaluation in the Emergency Department is complete. It has been determined that no acute conditions requiring further emergency intervention are present at this time. The patient/guardian have been advised of the diagnosis and plan. We have discussed signs and symptoms that warrant return to the ED, such as changes or worsening in symptoms.  Vital signs are stable at discharge. Filed Vitals:   02/03/15 2100  BP: 133/91  Pulse: 82  Temp:   Resp:     Patient/guardian has voiced understanding and agreed to follow-up with the PCP or specialist.      Delos Haring, PA-C 02/03/15 9179  Leonard Schwartz, MD 02/04/15 2318

## 2015-02-03 NOTE — ED Notes (Signed)
Pt verbalized understanding of d/c instructions and has no further questions. Pt stable and NAD. Pt's wife driving pt home.

## 2015-02-03 NOTE — ED Notes (Signed)
Pt seen at med center last night for abscess on back x 1 week.  Was given IV antibiotics and wanted to transport to [facility] for surgeon consult.  Pt did not want to be transported as he had to take care of business at home.  Presents today for evaluation.

## 2015-02-03 NOTE — ED Notes (Signed)
Pt left AMA because he states that he was dissatisfied with the care that he was receiving at this facility.  Pt reassured.  States that he is going to go to [facility] to see a Psychologist, sport and exercise.

## 2015-02-05 ENCOUNTER — Emergency Department (HOSPITAL_COMMUNITY)
Admission: EM | Admit: 2015-02-05 | Discharge: 2015-02-05 | Disposition: A | Discharge status: Home / Self Care | Payer: 59 | Source: Home / Self Care | Attending: Emergency Medicine | Admitting: Emergency Medicine

## 2015-02-05 ENCOUNTER — Encounter (HOSPITAL_COMMUNITY): Payer: Self-pay | Admitting: *Deleted

## 2015-02-05 DIAGNOSIS — Z4801 Encounter for change or removal of surgical wound dressing: Secondary | ICD-10-CM

## 2015-02-05 DIAGNOSIS — Z79899 Other long term (current) drug therapy: Secondary | ICD-10-CM | POA: Insufficient documentation

## 2015-02-05 DIAGNOSIS — I10 Essential (primary) hypertension: Secondary | ICD-10-CM | POA: Insufficient documentation

## 2015-02-05 DIAGNOSIS — Z87448 Personal history of other diseases of urinary system: Secondary | ICD-10-CM | POA: Insufficient documentation

## 2015-02-05 DIAGNOSIS — Z720 Tobacco use: Secondary | ICD-10-CM

## 2015-02-05 DIAGNOSIS — Z5189 Encounter for other specified aftercare: Secondary | ICD-10-CM

## 2015-02-05 MED ORDER — LIDOCAINE-EPINEPHRINE 2 %-1:100000 IJ SOLN
20.0000 mL | Freq: Once | INTRAMUSCULAR | Status: AC
  Administered 2015-02-05: 20 mL via INTRADERMAL
  Filled 2015-02-05: qty 20

## 2015-02-05 NOTE — Discharge Instructions (Signed)
Wound Check Your wound appears healthy today. Your wound will heal gradually over time. Eventually a scar will form that will fade with time. FACTORS THAT AFFECT SCAR FORMATION:  People differ in the severity in which they scar.  Scar severity varies according to location, size, and the traits you inherited from your parents (genetic predisposition).  Irritation to the wound from infection, rubbing, or chemical exposure will increase the amount of scar formation. HOME CARE INSTRUCTIONS   If you were given a dressing, you should change it at least once a day or as instructed by your caregiver. If the bandage sticks, soak it off with a solution of hydrogen peroxide.  If the bandage becomes wet, dirty, or develops a bad smell, change it as soon as possible.  Look for signs of infection.  Only take over-the-counter or prescription medicines for pain, discomfort, or fever as directed by your caregiver. SEEK IMMEDIATE MEDICAL CARE IF:   You have redness, swelling, or increasing pain in the wound.  You notice pus coming from the wound.  You have a fever.  You notice a bad smell coming from the wound or dressing. Document Released: 03/15/2004 Document Revised: 09/01/2011 Document Reviewed: 06/09/2005 [facility] Patient Information 2015 [place], [place]. This information is not intended to replace advice given to you by your health care provider. Make sure you discuss any questions you have with your health care provider.

## 2015-02-05 NOTE — ED Notes (Signed)
Declined W/C at D/C and was escorted to lobby by RN. 

## 2015-02-05 NOTE — ED Provider Notes (Signed)
CSN: [id number]     Arrival date & time 02/05/15  4944 History   First MD Initiated Contact with Patient 02/05/15 0801     Chief Complaint  Patient presents with  . Wound Check     (Consider location/radiation/quality/duration/timing/severity/associated sxs/prior Treatment) HPI 34 year old male who presents emergency department for wound recheck. Patient had I&D of an infected sebaceous cyst just above the gluteal cleft on 02/03/2015. Packing still in place. He states that he still has localized plate. However, the surrounding cellulitis seems to be resolving and he is feeling much better. He denies systemic symptoms of fever, chills, myalgias. Has history of previous skin infections. Past Medical History  Diagnosis Date  . Crohn's disease   . Hypertension   . Allergy   . Renal disorder     Nephrolithiasis  . Heroin addiction 06/23/2009    Remission since 2011.  Marland Kitchen Pancreatitis    Past Surgical History  Procedure Laterality Date  . Bowel resection    . Cholecystectomy    . Tonsillectomy    . Appendectomy    . Colon surgery     History reviewed. No pertinent family history. Social History  Substance Use Topics  . Smoking status: Current Every Day Smoker -- 1.50 packs/day  . Smokeless tobacco: Never Used  . Alcohol Use: Yes     Comment: occasional    Review of Systems  Ten systems reviewed and are negative for acute change, except as noted in the HPI.    Allergies  Prednisone; Vicodin; and Morphine and related  Home Medications   Prior to Admission medications   Medication Sig Start Date End Date Taking? Authorizing Provider  clindamycin (CLEOCIN) 150 MG capsule Take 1 capsule (150 mg total) by mouth every 6 (six) hours. 02/03/15   Tiffany Carlota Raspberry, PA-C  dicyclomine (BENTYL) 20 MG tablet Take 1 tablet (20 mg total) by mouth 2 (two) times daily. 11/20/14   Charlesetta Shanks, MD  ibuprofen (ADVIL,MOTRIN) 200 MG tablet Take 400 mg by mouth every 6 (six) hours as needed.     Historical Provider, MD  lisinopril-hydrochlorothiazide (PRINZIDE,ZESTORETIC) 20-25 MG per tablet Take 1 tablet by mouth daily. 11/22/14   Wardell Honour, MD  neomycin-bacitracin-polymyxin (NEOSPORIN) OINT Apply 1 application topically daily as needed for irritation or wound care.    Historical Provider, MD  omeprazole (PRILOSEC) 40 MG capsule TAKE 1 CAPSULE (40 MG TOTAL) BY MOUTH DAILY. 11/13/14   Wardell Honour, MD  ondansetron (ZOFRAN) 4 MG tablet Take 1 tablet (4 mg total) by mouth every 6 (six) hours. 02/03/15   Delos Haring, PA-C  oxyCODONE-acetaminophen (PERCOCET/ROXICET) 5-325 MG per tablet Take 1 tablet by mouth every 6 (six) hours as needed for severe pain. 02/03/15   Tiffany Carlota Raspberry, PA-C  RABEprazole (ACIPHEX) 20 MG tablet Take 1 tablet (20 mg total) by mouth daily. 11/22/14   Wardell Honour, MD  ranitidine (ZANTAC) 150 MG tablet Take 150 mg by mouth daily as needed for heartburn.    Historical Provider, MD   BP 138/87 mmHg  Pulse 68  Temp(Src) 97.9 F (36.6 C) (Oral)  Resp 20  SpO2 100% Physical Exam  Constitutional: He appears well-developed and well-nourished. No distress.  HENT:  Head: Normocephalic and atraumatic.  Eyes: Conjunctivae are normal. No scleral icterus.  Neck: Normal range of motion. Neck supple.  Cardiovascular: Normal rate, regular rhythm and normal heart sounds.   Pulmonary/Chest: Effort normal and breath sounds normal. No respiratory distress.  Abdominal: Soft. He exhibits no distension.  There is no tenderness. There is no guarding.  Musculoskeletal: He exhibits no edema.       Back:  Neurological: He is alert.  Skin: Skin is warm and dry. He is not diaphoretic.  Psychiatric: His behavior is normal.  Nursing note and vitals reviewed.   ED Course  INCISION AND DRAINAGE Date/Time: 02/05/2015 10:16 AM Performed by: Margarita Mail Authorized by: Margarita Mail Consent: Verbal consent obtained. Risks and benefits: risks, benefits and alternatives were  discussed Consent given by: patient Patient identity confirmed: provided demographic data Time out: Immediately prior to procedure a "time out" was called to verify the correct patient, procedure, equipment, support staff and site/side marked as required. Type: cyst Body area: trunk Anesthesia: local infiltration Local anesthetic: lidocaine 2% with epinephrine Anesthetic total: 10 ml Patient sedated: no Patient tolerance: Patient tolerated the procedure well with no immediate complications Comments: Patient wound was anesthetized. During the procedure. Patient declined to have a larger area opened for removal of purulence and sebaceous material. I was however able to express a large amount of sebaceous material and purulence followed by washing out of the wound.  No incisions made.   (including critical care time) Labs Review Labs Reviewed - No data to display  Imaging Review Ct Lumbar Spine W Contrast  02/03/2015   CLINICAL DATA:  Abscess. Focal subcutaneous lesion in the midline with drainage.  EXAM: CT LUMBAR SPINE WITH CONTRAST  TECHNIQUE: Multidetector CT imaging of the lumbar spine was performed with intravenous contrast administration. Multiplanar CT image reconstructions were also generated.  CONTRAST:  163m OMNIPAQUE IOHEXOL 300 MG/ML  SOLN  COMPARISON:  CT abdomen and pelvis with contrast 09/12/2012.  FINDINGS: Previously noted subcutaneous collection at the level of the sacrum demonstrates marked inflammatory change. There is drainage to the skin surface. Diffuse subcutaneous edema is present. There is no deep drainage.  Cholecystectomy is noted. The kidneys and ureters are within normal limits. The visualized bowel is unremarkable. Early alcoholic anastomosis is present. There is no significant adenopathy or free fluid.  Bone windows are unremarkable.  IMPRESSION: 1. 2.1 x 2.1 x 1.9 cm thick walled draining subcutaneous abscess at the level of the sacrum in the midline. A previous  well-defined subcutaneous lesion was noted at the same level, likely a sebaceous cyst which has subsequently become infected. 2. No acute inter abdominal process. 3. No deep drainage or extension of the abscess.   Electronically Signed   By: CSan MorelleM.D.   On: 02/03/2015 19:55   I, HMargarita Mail personally reviewed and evaluated these images and lab results as part of my medical decision-making.   EKG Interpretation None      MDM   Final diagnoses:  Wound check, abscess    Patient with infected sebaceous cyst. Packing removed and there was still purulent drainage and sebaceous material present within the wound. Patient declined further excision as I had discussed with him about creating a large elliptical area to remove contained materials. I was however able to remove a large amount with compression of the cyst wall. He shouldn't is currently taking doxycycline. Afebrile and his wound is improving from previous. I discussed reasons to seek immediate medical care and reasons to return for wound check. Patient appears safe for discharge at this time.    AMargarita Mail PA-C 02/05/15 [place] MD 02/05/15 [phone]

## 2015-02-05 NOTE — ED Notes (Signed)
Pt presents to ED for wound check.

## 2015-02-08 LAB — CULTURE, BLOOD (ROUTINE X 2)
Culture: NO GROWTH
Culture: NO GROWTH

## 2015-04-25 ENCOUNTER — Encounter: Payer: 59 | Admitting: Family Medicine

## 2015-04-27 ENCOUNTER — Encounter: Payer: 59 | Admitting: Family Medicine

## 2015-06-28 ENCOUNTER — Other Ambulatory Visit: Payer: Self-pay

## 2015-06-28 DIAGNOSIS — I10 Essential (primary) hypertension: Secondary | ICD-10-CM

## 2015-06-28 MED ORDER — LISINOPRIL-HYDROCHLOROTHIAZIDE 20-25 MG PO TABS: 1.0000 | ORAL_TABLET | Freq: Every day | ORAL | Status: DC

## 2015-06-28 MED ORDER — RABEPRAZOLE SODIUM 20 MG PO TBEC: 20.0000 mg | DELAYED_RELEASE_TABLET | Freq: Every day | ORAL | Status: DC

## 2015-07-03 ENCOUNTER — Other Ambulatory Visit: Payer: Self-pay

## 2015-07-03 DIAGNOSIS — I10 Essential (primary) hypertension: Secondary | ICD-10-CM

## 2015-07-03 MED ORDER — LISINOPRIL-HYDROCHLOROTHIAZIDE 20-25 MG PO TABS: 1.0000 | ORAL_TABLET | Freq: Every day | ORAL | Status: DC

## 2015-07-03 MED ORDER — RABEPRAZOLE SODIUM 20 MG PO TBEC: 20.0000 mg | DELAYED_RELEASE_TABLET | Freq: Every day | ORAL | Status: DC

## 2015-07-22 ENCOUNTER — Other Ambulatory Visit: Payer: Self-pay | Admitting: Family Medicine

## 2015-07-25 ENCOUNTER — Telehealth: Payer: Self-pay

## 2015-07-25 DIAGNOSIS — K50018 Crohn's disease of small intestine with other complication: Secondary | ICD-10-CM

## 2015-07-25 NOTE — Telephone Encounter (Addendum)
Leroy Herrera states even though they have a G.I. Dr, now they have to have a referral in order for them to be seen again. It is with Dr Benson Norway on [facility], they have an appt on 08/01/15 Please call [phone] when done  [facility]

## 2015-07-27 NOTE — Telephone Encounter (Signed)
Referral to Dr. Benson Norway placed.  Please update Estill Bamberg at [phone].

## 2015-07-30 NOTE — Telephone Encounter (Signed)
Referral has been sent to [facility] today, 07/30/15, for Dr Benson Norway.

## 2015-09-10 ENCOUNTER — Other Ambulatory Visit: Payer: Self-pay

## 2015-09-10 MED ORDER — LISINOPRIL-HYDROCHLOROTHIAZIDE 20-25 MG PO TABS: ORAL_TABLET | ORAL | Status: DC

## 2015-09-12 ENCOUNTER — Encounter: Payer: Self-pay | Admitting: Family Medicine

## 2015-09-12 ENCOUNTER — Ambulatory Visit (INDEPENDENT_AMBULATORY_CARE_PROVIDER_SITE_OTHER): Payer: 59 | Admitting: Family Medicine

## 2015-09-12 VITALS — BP 152/88 | HR 98 | Temp 98.6°F | Resp 16 | Ht 72.0 in | Wt 180.0 lb

## 2015-09-12 DIAGNOSIS — I10 Essential (primary) hypertension: Secondary | ICD-10-CM | POA: Diagnosis not present

## 2015-09-12 DIAGNOSIS — J029 Acute pharyngitis, unspecified: Secondary | ICD-10-CM

## 2015-09-12 DIAGNOSIS — G2581 Restless legs syndrome: Secondary | ICD-10-CM

## 2015-09-12 DIAGNOSIS — Z23 Encounter for immunization: Secondary | ICD-10-CM | POA: Diagnosis not present

## 2015-09-12 DIAGNOSIS — Z114 Encounter for screening for human immunodeficiency virus [HIV]: Secondary | ICD-10-CM

## 2015-09-12 LAB — CBC WITH DIFFERENTIAL/PLATELET
BASOS ABS: 0.1 10*3/uL (ref 0.0–0.1)
Basophils Relative: 1 % (ref 0–1)
EOS ABS: 0 10*3/uL (ref 0.0–0.7)
EOS PCT: 0 % (ref 0–5)
HEMATOCRIT: 40.5 % (ref 39.0–52.0)
Hemoglobin: 13.7 g/dL (ref 13.0–17.0)
LYMPHS ABS: 1.4 10*3/uL (ref 0.7–4.0)
LYMPHS PCT: 13 % (ref 12–46)
MCH: 29.4 pg (ref 26.0–34.0)
MCHC: 33.8 g/dL (ref 30.0–36.0)
MCV: 86.9 fL (ref 78.0–100.0)
MONO ABS: 0.3 10*3/uL (ref 0.1–1.0)
MPV: 9.6 fL (ref 8.6–12.4)
Monocytes Relative: 3 % (ref 3–12)
Neutro Abs: 9.1 10*3/uL — ABNORMAL HIGH (ref 1.7–7.7)
Neutrophils Relative %: 83 % — ABNORMAL HIGH (ref 43–77)
PLATELETS: 240 10*3/uL (ref 150–400)
RBC: 4.66 MIL/uL (ref 4.22–5.81)
RDW: 13.6 % (ref 11.5–15.5)
WBC: 11 10*3/uL — AB (ref 4.0–10.5)

## 2015-09-12 LAB — IBC PANEL
%SAT: 11 % — AB (ref 15–60)
TIBC: 410 ug/dL (ref 250–425)
UIBC: 365 ug/dL (ref 125–400)

## 2015-09-12 LAB — COMPREHENSIVE METABOLIC PANEL
ALK PHOS: 72 U/L (ref 40–115)
ALT: 19 U/L (ref 9–46)
AST: 17 U/L (ref 10–40)
Albumin: 4.7 g/dL (ref 3.6–5.1)
BUN: 13 mg/dL (ref 7–25)
CALCIUM: 9.5 mg/dL (ref 8.6–10.3)
CHLORIDE: 105 mmol/L (ref 98–110)
CO2: 20 mmol/L (ref 20–31)
Creat: 1.07 mg/dL (ref 0.60–1.35)
GLUCOSE: 116 mg/dL — AB (ref 65–99)
POTASSIUM: 4.6 mmol/L (ref 3.5–5.3)
Sodium: 136 mmol/L (ref 135–146)
Total Bilirubin: 0.3 mg/dL (ref 0.2–1.2)
Total Protein: 6.9 g/dL (ref 6.1–8.1)

## 2015-09-12 LAB — TSH: TSH: 0.51 m[IU]/L (ref 0.40–4.50)

## 2015-09-12 LAB — FERRITIN: FERRITIN: 40 ng/mL (ref 20–345)

## 2015-09-12 LAB — POCT RAPID STREP A (OFFICE): Rapid Strep A Screen: NEGATIVE

## 2015-09-12 LAB — IRON: IRON: 45 ug/dL — AB (ref 50–180)

## 2015-09-12 MED ORDER — PRAMIPEXOLE DIHYDROCHLORIDE 0.125 MG PO TABS: 0.1250 mg | ORAL_TABLET | Freq: Every day | ORAL | Status: DC

## 2015-09-12 MED ORDER — RABEPRAZOLE SODIUM 20 MG PO TBEC: 20.0000 mg | DELAYED_RELEASE_TABLET | Freq: Two times a day (BID) | ORAL | Status: DC

## 2015-09-12 MED ORDER — FLUTICASONE PROPIONATE 50 MCG/ACT NA SUSP: 2.0000 | Freq: Every day | NASAL | Status: DC

## 2015-09-12 MED ORDER — LISINOPRIL-HYDROCHLOROTHIAZIDE 20-25 MG PO TABS: ORAL_TABLET | ORAL | Status: DC

## 2015-09-12 NOTE — Patient Instructions (Addendum)
IF you received an x-ray today, you will receive an invoice from [facility]. Please contact [facility] at [phone] with questions or concerns regarding your invoice.   IF you received labwork today, you will receive an invoice from [employer]. Please contact [facility] at [phone] with questions or concerns regarding your invoice.   Our billing staff will not be able to assist you with questions regarding bills from these companies.  You will be contacted with the lab results as soon as they are available. The fastest way to get your results is to activate your My Chart account. Instructions are located on the last page of this paperwork. If you have not heard from [place] regarding the results in 2 weeks, please contact this office.      Restless Legs Syndrome Restless legs syndrome is a condition that causes uncomfortable feelings or sensations in the legs, especially while sitting or lying down. The sensations usually cause an overwhelming urge to move the legs. The arms can also sometimes be affected. The condition can range from mild to severe. The symptoms often interfere with a person's ability to sleep. CAUSES The cause of this condition is not known. RISK FACTORS This condition is more likely to develop in:  People who are older than age 56.  Pregnant women. In general, restless legs syndrome is more common in women than in men.  People who have a family history of the condition.  People who have certain medical conditions, such as iron deficiency, kidney disease, Parkinson disease, or nerve damage.  People who take certain medicines, such as medicines for high blood pressure, nausea, colds, allergies, depression, and some heart conditions. SYMPTOMS The main symptom of this condition is uncomfortable sensations in the legs. These sensations may be:  Described as pulling, tingling, prickling, throbbing, crawling, or  burning.  Worse while you are sitting or lying down.  Worse during periods of rest or inactivity.  Worse at night, often interfering with your sleep.  Accompanied by a very strong urge to move your legs.  Temporarily relieved by movement of your legs. The sensations usually affect both sides of the body. The arms can also be affected, but this is rare. People who have this condition often have tiredness during the day because of their lack of sleep at night. DIAGNOSIS This condition may be diagnosed based on your description of the symptoms. You may also have tests, including blood tests, to check for other conditions that may lead to your symptoms. In some cases, you may be asked to spend some time in a sleep lab so your sleeping can be monitored. TREATMENT Treatment for this condition is focused on managing the symptoms. Treatment may include:  Self-help and lifestyle changes.  Medicines. HOME CARE INSTRUCTIONS  Take medicines only as directed by your health care provider.  Try these methods to get temporary relief from the uncomfortable sensations:  Massage your legs.  Walk or stretch.  Take a cold or hot bath.  Practice good sleep habits. For example, go to bed and get up at the same time every day.  Exercise regularly.  Practice ways of relaxing, such as yoga or meditation.  Avoid caffeine and alcohol.  Do not use any tobacco products, including cigarettes, chewing tobacco, or electronic cigarettes. If you need help quitting, ask your health care provider.  Keep all follow-up visits as directed by your health care provider. This is important. SEEK MEDICAL CARE IF: Your symptoms do  not improve with treatment, or they get worse.   This information is not intended to replace advice given to you by your health care provider. Make sure you discuss any questions you have with your health care provider.   Document Released: 05/30/2002 Document Revised: 10/24/2014  Document Reviewed: 06/05/2014 Elsevier Interactive Patient Education [employer].

## 2015-09-12 NOTE — Progress Notes (Signed)
Subjective:    Patient ID: Leroy Herrera, male    DOB: 09-18-1980, 35 y.o.   MRN: [id number]  09/12/2015  Medication Refill; Sore Throat; Cough; and Flu Vaccine   HPI This 35 y.o. male presents for nine month follow-up and for acute visit:   1. HTN: Patient reports good compliance with medication, good tolerance to medication, and good symptom control.    2.  Sore throat and cough:   3. Crohn's disease: s/p colonoscopy by Dr. Benson Norway eight months ago. +active Crohns; taking Humira once every two weeks.  Injection which is painful; took three months to adjust.  Suffered with extreme joint pain three days before needing next injection.  Having problems walking up and stairs. Now side effects resolved/gone.  Did have a flare up --- one month ago; like old days, extreme vomiting, malaise.  Wife made patient go to emergency room; s/p CT scan that showed inflammation in ileum; [facility]; can check in on line.  ED provider prescribed short cycle of Prednisone; Hung prescribed 47m for one week, then 321mdaily for one week, then 2086maily for one week, then 85m65mily for one week, then decreased to 5mg 49mly.  Symptoms acutely worsened with decrease to 5mg d56my so had pt increase to 12.5mg da26m for one week.  Suffered with a lot of pain in abdomen.  Stayed on 85mg da50mfor one week and then decrease to 7.5mg dail67mor one week, then 5mg daily25mr one week, then 2.5mg daily 3m one week.  Continue Humira while taking Prednisone. When 19 years ol63, would have a flare up and get really sick.  Continued for 2-3 years and then suffered bowel perforation.  At ileum at site of surgery, lots of stricture at site of inflammation; monitoring; doing relatively well.  Really wants to avoid.  Eating pattern is sporadic; has had a lot of pain with eating.  Enough discomfort to prevent ongoing pain.  Eats when can; sometimes not hungry at all.  Can go all day and not eat.  Has Boost and Ensure.  Not  eating lunch either.  When gets home, will eat for four hours.  Not starving but can eat.  Could be mental too due to fear of obstruction. Eating is improving.    Son is 1.5 years o29d19now.  Son is huge.  Hannah almoJarrett Sohoe years 61    2.  CPE scheduled in 01/2016.  Really ready to quit smoking.  Wants to try Chantix next time.  Trying to cut back.  2 ppd.  Tired of talking about it.  Guy that woLuvenia Heller for, just had a AMI.  Smoked since [age] ye23     3.  Sebaceous cyst: got severely infected in 01/2015; started getting bigger; constantly in and out of car and could feel it; continued to enlarge.  No recurrence; has a hole in it; does not drain but will squeeze it and thick drainage comes out.  No enlargement.    4. Allergic rhinitis: started Claritin last week; has suffered with sore throat for four weeks.  Sore throat has improved.  Can still feel it but has improved; ex-wife had mono.   Has not been around her recently.   +rhinorrhea; +nasal congestion intermittent.  Worse in morning.  No fever/chills/sweats.  Wife did have the flu; daughter had the flu.  Did get sick with onset.  Might have been running a fever.    5.  RLS: interfering with  sleep; onset three months ago.   Review of Systems  Constitutional: Negative for fever, chills, diaphoresis, activity change, appetite change and fatigue.  Respiratory: Negative for cough and shortness of breath.   Cardiovascular: Negative for chest pain, palpitations and leg swelling.  Gastrointestinal: Negative for nausea, vomiting, abdominal pain and diarrhea.  Endocrine: Negative for cold intolerance, heat intolerance, polydipsia, polyphagia and polyuria.  Skin: Negative for color change, rash and wound.  Neurological: Negative for dizziness, tremors, seizures, syncope, facial asymmetry, speech difficulty, weakness, light-headedness, numbness and headaches.  Psychiatric/Behavioral: Negative for sleep disturbance and dysphoric mood. The patient  is not nervous/anxious.     Past Medical History  Diagnosis Date  . Crohn's disease ([place])   . Hypertension   . Allergy   . Renal disorder     Nephrolithiasis  . Heroin addiction ([place]) 06/23/2009    Remission since 2011.  Marland Kitchen Pancreatitis    Past Surgical History  Procedure Laterality Date  . Bowel resection    . Cholecystectomy    . Tonsillectomy    . Appendectomy    . Colon surgery     Allergies  Allergen Reactions  . Prednisone Anaphylaxis    High dose allergy  . Vicodin [Hydrocodone-Acetaminophen] Hives and Itching  . Morphine And Related Hives    Social History   Social History  . Marital Status: Married    Spouse Name: N/A  . Number of Children: N/A  . Years of Education: N/A   Occupational History  . Not on file.   Social History Main Topics  . Smoking status: Current Every Day Smoker -- 1.50 packs/day  . Smokeless tobacco: Never Used  . Alcohol Use: Yes     Comment: occasional  . Drug Use: No     Comment: PREVIOUS HEROIN ADDICTION 2011; RECOVERY SINCE 2011.  Marland Kitchen Sexual Activity: Yes   Other Topics Concern  . Not on file   Social History Narrative   Marital status: Married x 1 year; second marriage. Divorced from first marriage due to heroin addiction.      Children: one daughter who lives with mother.      Lives: with wife.      Employment: auto repairs      Tobacco:  1.5 ppd      Alcohol: weekends      Drugs: recovering heroin addict since 2011.  Narcotic abuse/overuse/misuse in past.      Exercise: none       History reviewed. No pertinent family history.     Objective:    BP 152/88 mmHg  Pulse 98  Temp(Src) 98.6 F (37 C) (Oral)  Resp 16  Ht 6' (1.829 m)  Wt 180 lb (81.647 kg)  BMI 24.41 kg/m2  SpO2 98% Physical Exam  Constitutional: He is oriented to person, place, and time. He appears well-developed and well-nourished. No distress.  HENT:  Head: Normocephalic and atraumatic.  Right Ear: External ear normal.  Left Ear: External  ear normal.  Nose: Nose normal.  Mouth/Throat: Oropharynx is clear and moist.  Eyes: Conjunctivae and EOM are normal. Pupils are equal, round, and reactive to light.  Neck: Normal range of motion. Neck supple. Carotid bruit is not present. No thyromegaly present.  Cardiovascular: Normal rate, regular rhythm, normal heart sounds and intact distal pulses.  Exam reveals no gallop and no friction rub.   No murmur heard. Pulmonary/Chest: Effort normal and breath sounds normal. He has no wheezes. He has no rales.  Abdominal: Soft. Bowel sounds are  normal. He exhibits no distension and no mass. There is no tenderness. There is no rebound and no guarding.  Lymphadenopathy:    He has no cervical adenopathy.  Neurological: He is alert and oriented to person, place, and time. No cranial nerve deficit.  Skin: Skin is warm and dry. No rash noted. He is not diaphoretic.  Psychiatric: He has a normal mood and affect. His behavior is normal.  Nursing note and vitals reviewed.       Assessment & Plan:   1. Sore throat   2. Essential hypertension, benign   3. Restless leg syndrome   4. Need for prophylactic vaccination and inoculation against influenza   5. Screening for HIV (human immunodeficiency virus)     Orders Placed This Encounter  Procedures  . Culture, Group A Strep    Order Specific Question:  Source    Answer:  throat  . Flu Vaccine QUAD 36+ mos IM  . CBC with Differential/Platelet  . Comprehensive metabolic panel  . HIV antibody  . Iron  . IBC panel  . Ferritin  . TSH  . POCT rapid strep A   Meds ordered this encounter  Medications  . Adalimumab (HUMIRA) 20 MG/0.4ML PSKT    Sig: Inject into the skin.  . predniSONE (DELTASONE) 20 MG tablet    Sig: Take 20 mg by mouth daily with breakfast.  . RABEprazole (ACIPHEX) 20 MG tablet    Sig: Take 1 tablet (20 mg total) by mouth 2 (two) times daily.    Dispense:  180 tablet    Refill:  3  . lisinopril-hydrochlorothiazide  (PRINZIDE,ZESTORETIC) 20-25 MG tablet    Sig: Take 1 tablet by mouth  daily    Dispense:  90 tablet    Refill:  3  . DISCONTD: pramipexole (MIRAPEX) 0.125 MG tablet    Sig: Take 1-2 tablets (0.125-0.25 mg total) by mouth at bedtime.    Dispense:  60 tablet    Refill:  5  . fluticasone (FLONASE) 50 MCG/ACT nasal spray    Sig: Place 2 sprays into both nostrils daily.    Dispense:  18 g    Refill:  0    No Follow-up on file.    Chaitra Mast Elayne Guerin, M.D. [facility] [address] [place], [place]  [phone] phone [phone] fax

## 2015-09-13 LAB — HIV ANTIBODY (ROUTINE TESTING W REFLEX): HIV 1&2 Ab, 4th Generation: NONREACTIVE

## 2015-09-14 LAB — CULTURE, GROUP A STREP: Organism ID, Bacteria: NORMAL

## 2015-09-20 ENCOUNTER — Other Ambulatory Visit: Payer: Self-pay

## 2015-09-20 MED ORDER — PRAMIPEXOLE DIHYDROCHLORIDE 0.125 MG PO TABS: 0.1250 mg | ORAL_TABLET | Freq: Every day | ORAL | Status: DC

## 2015-10-02 ENCOUNTER — Ambulatory Visit (INDEPENDENT_AMBULATORY_CARE_PROVIDER_SITE_OTHER): Payer: 59 | Admitting: Physician Assistant

## 2015-10-02 ENCOUNTER — Encounter: Payer: Self-pay | Admitting: Physician Assistant

## 2015-10-02 VITALS — BP 116/76 | HR 87 | Temp 98.7°F | Resp 16 | Ht 71.5 in | Wt 173.6 lb

## 2015-10-02 DIAGNOSIS — M25552 Pain in left hip: Secondary | ICD-10-CM | POA: Diagnosis not present

## 2015-10-02 MED ORDER — CELECOXIB 200 MG PO CAPS: 200.0000 mg | ORAL_CAPSULE | Freq: Two times a day (BID) | ORAL | Status: DC

## 2015-10-02 MED ORDER — CYCLOBENZAPRINE HCL 10 MG PO TABS
10.0000 mg | ORAL_TABLET | Freq: Three times a day (TID) | ORAL | Status: DC | PRN
Start: 2015-10-02 — End: 2016-06-23

## 2015-10-02 NOTE — Patient Instructions (Addendum)
Take Flexeril and Celebrex as needed for pain. Try hip and lower back exercises. Continue hot and cold compression. If symptoms worsen of fail to improve after 10-14 days please call or return to clinic.    IF you received an x-ray today, you will receive an invoice from [facility]. Please contact [facility] at [phone] with questions or concerns regarding your invoice.   IF you received labwork today, you will receive an invoice from [employer]. Please contact [facility] at [phone] with questions or concerns regarding your invoice.   Our billing staff will not be able to assist you with questions regarding bills from these companies.  You will be contacted with the lab results as soon as they are available. The fastest way to get your results is to activate your My Chart account. Instructions are located on the last page of this paperwork. If you have not heard from [place] regarding the results in 2 weeks, please contact this office.

## 2015-10-02 NOTE — Progress Notes (Signed)
Subjective:     Patient ID: Leroy Herrera, male   DOB: 13-May-1981, 35 y.o.   MRN: [id number] Chief Complaint  Patient presents with  . LBP    left hip "feels like a fireball" stated x 3 days ago    HPI  Patient here for evaluation of left hip and back pain.  He states that his pain started approx 3-4 days ago. He describes his low back pain as a dull pressure with intermittant sharp pain down his left hip and leg. He also complains of intermittent left leg tingling and numbness after sitting for long periods. He rates the pain a 7/10. He has tried ice compression, ibuprofen, tylenol and percocet with little relief. Laying down and walking makes the pain worse. Sitting in a chair with back support and hot showers makes the pain better.   He is requesting a "few percocet" because he has not been able to sleep the past couple of nights due to pain.   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  Denies any fever, chills, N/V/D, dysuria, blood in urine, change in bowel habits, loss of bowel or bladder, or saddle paresthesias.     Review of Systems  Constitutional: Negative for fever, chills and fatigue.  HENT: Negative.   Eyes: Negative.   Respiratory: Negative for shortness of breath.   Cardiovascular: Negative for chest pain.  Gastrointestinal: Negative for nausea, vomiting, abdominal pain and diarrhea.  Genitourinary: Positive for urgency and frequency. Negative for dysuria, hematuria and flank pain.  Musculoskeletal: Positive for back pain. Negative for myalgias, arthralgias and neck pain.  Neurological: Negative for dizziness, syncope, weakness, light-headedness and headaches.       Objective:   Physical Exam  Constitutional: He is oriented to person, place, and time. He appears well-developed and well-nourished. No distress.  Eyes: Pupils are equal, round, and reactive to light.  Neck: Normal range of motion. Neck supple.   Cardiovascular: Normal rate, regular rhythm, normal heart sounds and intact distal pulses.   Pulmonary/Chest: Effort normal and breath sounds normal.  Musculoskeletal: Normal range of motion.       Left hip: He exhibits tenderness (Over ischial spine and inguinal ligament with palpation and movement) and bony tenderness. He exhibits normal range of motion, normal strength, no swelling, no crepitus and no deformity.       Lumbar back: He exhibits tenderness (Left paraspinous muscles). He exhibits normal range of motion, no bony tenderness and no spasm.  Neurological: He is alert and oriented to person, place, and time. He has normal reflexes.  Skin: Skin is warm and dry.   Filed Vitals:   10/02/15 1510 10/02/15 1515  BP: 140/100 116/76  Pulse: 87   Temp: 98.7 F (37.1 C)   Resp: 16        Assessment/Plan:     1. Hip pain, left Patient has CT scan of lower back in August of 2016 for piladonal cyst. No bony or disc abnormalities at that time. Most likely inflammation and muscle strain. Pain reproducible on palpation and with movement. Continue hot/cold compresses and lower back and hip strengthening exercises. Due to patient having GERD and Chron's disease will prescribe Celebrex to limit GI discomfort. Patient is requesting percocet. Spoke with Dr. Tamala Julian is regular doctor and she recommends against percocet due to history of drug use.  If symptoms persists will re consider imaging.  - cyclobenzaprine (FLEXERIL) 10 MG tablet; Take 1 tablet (10  mg total) by mouth 3 (three) times daily as needed for muscle spasms.  Dispense: 30 tablet; Refill: 0 - celecoxib (CELEBREX) 200 MG capsule; Take 1 capsule (200 mg total) by mouth 2 (two) times daily.  Dispense: 30 capsule; Refill: 0

## 2015-10-02 NOTE — Progress Notes (Signed)
Patient ID: Leroy Herrera, male    DOB: 1980-10-31, 35 y.o.   MRN: [id number]  PCP: Reginia Forts, MD  Subjective:   Chief Complaint  Patient presents with  . LBP    left hip "feels like a fireball" stated x 3 days ago    HPI Presents for evaluation of left hip and back pain.  He states that his pain started approx 3-4 days ago. He describes his low back pain as a dull pressure with intermittant sharp pain down his left hip and leg. He also complains of intermittent left leg tingling and numbness after sitting for long periods. He rates the pain a 7/10. He has tried ice compression, ibuprofen, tylenol and percocet with little relief. Laying down and walking makes the pain worse. Sitting in a chair with back support and hot showers makes the pain better.   He is requesting a "few percocet" because he has not been able to sleep the past couple of nights due to pain.   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  Denies any fever, chills, N/V/D, dysuria, blood in urine, change in bowel habits, loss of bowel or bladder, or saddle paresthesias.    Of note, he has a history of Heroin Addiction, in remission since 2011.    Review of Systems Constitutional: Negative for fever, chills and fatigue.  HENT: Negative.  Eyes: Negative.  Respiratory: Negative for shortness of breath.  Cardiovascular: Negative for chest pain.  Gastrointestinal: Negative for nausea, vomiting, abdominal pain and diarrhea.  Genitourinary: Positive for urgency and frequency. Negative for dysuria, hematuria and flank pain.  Musculoskeletal: Positive for back pain. Negative for myalgias, arthralgias and neck pain.  Neurological: Negative for dizziness, syncope, weakness, light-headedness and headaches.     Patient Active Problem List   Diagnosis Date Noted  . GERD (gastroesophageal reflux disease) 03/12/2013  . Essential hypertension, benign 06/07/2012  .  Nephrolithiasis 06/07/2012  . Crohn's disease ([place]) 06/07/2012  . Dental caries 06/07/2012  . Abdominal pain 06/07/2012     Prior to Admission medications   Medication Sig Start Date End Date Taking? Authorizing Provider  Adalimumab (HUMIRA) 20 MG/0.4ML PSKT Inject into the skin.   Yes Historical Provider, MD  dicyclomine (BENTYL) 20 MG tablet Take 1 tablet (20 mg total) by mouth 2 (two) times daily. 11/20/14  Yes Charlesetta Shanks, MD  fluticasone (FLONASE) 50 MCG/ACT nasal spray Place 2 sprays into both nostrils daily. 09/12/15  Yes Wardell Honour, MD  ibuprofen (ADVIL,MOTRIN) 200 MG tablet Take 400 mg by mouth every 6 (six) hours as needed.   Yes Historical Provider, MD  lisinopril-hydrochlorothiazide (PRINZIDE,ZESTORETIC) 20-25 MG tablet Take 1 tablet by mouth  daily 09/12/15  Yes Wardell Honour, MD  omeprazole (PRILOSEC) 40 MG capsule TAKE 1 CAPSULE (40 MG TOTAL) BY MOUTH DAILY. 11/13/14  Yes Wardell Honour, MD  ondansetron (ZOFRAN) 4 MG tablet Take 1 tablet (4 mg total) by mouth every 6 (six) hours. 02/03/15  Yes Tiffany Carlota Raspberry, PA-C  pramipexole (MIRAPEX) 0.125 MG tablet Take 1-2 tablets (0.125-0.25 mg total) by mouth at bedtime. 09/20/15  Yes Wardell Honour, MD  RABEprazole (ACIPHEX) 20 MG tablet Take 1 tablet (20 mg total) by mouth 2 (two) times daily. 09/12/15  Yes Wardell Honour, MD  ranitidine (ZANTAC) 150 MG tablet Take 150 mg by mouth daily as needed for heartburn.   Yes Historical Provider, MD  oxyCODONE-acetaminophen (PERCOCET/ROXICET) 5-325 MG per tablet Take  1 tablet by mouth every 6 (six) hours as needed for severe pain. Patient not taking: Reported on 09/12/2015 02/03/15   Delos Haring, PA-C     Allergies  Allergen Reactions  . Prednisone Anaphylaxis    High dose allergy  . Vicodin [Hydrocodone-Acetaminophen] Hives and Itching  . Morphine And Related Hives       Objective:  Physical Exam  Constitutional: He is oriented to person, place, and time. He appears well-developed  and well-nourished. He is active and cooperative. No distress.  BP 116/76 mmHg  Pulse 87  Temp(Src) 98.7 F (37.1 C) (Oral)  Resp 16  Ht 5' 11.5" (1.816 m)  Wt 173 lb 9.6 oz (78.744 kg)  BMI 23.88 kg/m2  SpO2 97%  HENT:  Head: Normocephalic and atraumatic.  Right Ear: Hearing normal.  Left Ear: Hearing normal.  Eyes: Conjunctivae are normal. No scleral icterus.  Neck: Normal range of motion. Neck supple. No thyromegaly present.  Cardiovascular: Normal rate, regular rhythm and normal heart sounds.   Pulses:      Radial pulses are 2+ on the right side, and 2+ on the left side.  Pulmonary/Chest: Effort normal and breath sounds normal.  Musculoskeletal:       Left hip: He exhibits tenderness and bony tenderness (along the superior iliac crest laterally and anteriorly). He exhibits normal range of motion (pain at the superior iliac crest with internal and external rotation ), normal strength and no swelling.       Thoracic back: Normal.       Lumbar back: He exhibits tenderness (LEFT paraspinous muscles) and pain. He exhibits normal range of motion, no bony tenderness and no spasm.  Lymphadenopathy:       Head (right side): No tonsillar, no preauricular, no posterior auricular and no occipital adenopathy present.       Head (left side): No tonsillar, no preauricular, no posterior auricular and no occipital adenopathy present.    He has no cervical adenopathy.       Right: No supraclavicular adenopathy present.       Left: No supraclavicular adenopathy present.  Neurological: He is alert and oriented to person, place, and time. No sensory deficit.  Skin: Skin is warm, dry and intact. No rash noted. No cyanosis or erythema. Nails show no clubbing.  Psychiatric: He has a normal mood and affect. His speech is normal and behavior is normal.           Assessment & Plan:   1. Hip pain, left Anti-inflammatory and muscle relaxer. Opiates not appropriate at this time. - cyclobenzaprine  (FLEXERIL) 10 MG tablet; Take 1 tablet (10 mg total) by mouth 3 (three) times daily as needed for muscle spasms.  Dispense: 30 tablet; Refill: 0 - celecoxib (CELEBREX) 200 MG capsule; Take 1 capsule (200 mg total) by mouth 2 (two) times daily.  Dispense: 30 capsule; Refill: 0   Fara Chute, PA-C Physician Assistant-Certified [facility]

## 2015-10-04 ENCOUNTER — Encounter: Payer: Self-pay | Admitting: Family Medicine

## 2015-10-14 ENCOUNTER — Encounter: Payer: Self-pay | Admitting: Physician Assistant

## 2015-10-14 DIAGNOSIS — K50018 Crohn's disease of small intestine with other complication: Secondary | ICD-10-CM

## 2015-10-17 ENCOUNTER — Other Ambulatory Visit: Payer: Self-pay | Admitting: Family Medicine

## 2015-10-23 ENCOUNTER — Other Ambulatory Visit: Payer: Self-pay | Admitting: Family Medicine

## 2015-10-23 ENCOUNTER — Telehealth: Payer: Self-pay | Admitting: *Deleted

## 2015-10-23 NOTE — Telephone Encounter (Signed)
Patient stated he would like another month of Flonase feels it is really working

## 2015-11-09 ENCOUNTER — Other Ambulatory Visit: Payer: Self-pay | Admitting: Family Medicine

## 2015-11-09 NOTE — Telephone Encounter (Signed)
Patient wife called concerning husbands medication. Patient insurance will not cover medication unless he have prior authorization. Or insurance will cover one a day instead of two. Patient is completely out of his medication. Wife stated to call in medication at [facility] at [address]. [phone].

## 2015-11-09 NOTE — Telephone Encounter (Signed)
Aciphex generic, prior authorization. He takes this 2 tabs daily.  He tried all of the medications for GERD his wife believes.

## 2015-11-09 NOTE — Telephone Encounter (Signed)
Completed PA form on paper that I received and faxed in for Aciphex BID. Pending. Notified wife of status.

## 2015-11-12 MED ORDER — RABEPRAZOLE SODIUM 20 MG PO TBEC: 20.0000 mg | DELAYED_RELEASE_TABLET | Freq: Every day | ORAL | Status: DC

## 2015-11-12 MED ORDER — PANTOPRAZOLE SODIUM 40 MG PO TBEC: 40.0000 mg | DELAYED_RELEASE_TABLET | Freq: Two times a day (BID) | ORAL | Status: DC

## 2015-11-12 NOTE — Telephone Encounter (Signed)
Aciphex once daily approved and escribed.

## 2015-11-12 NOTE — Addendum Note (Signed)
Addended by: Elwyn Reach A on: 11/12/2015 10:29 AM   Modules accepted: Orders

## 2015-11-12 NOTE — Telephone Encounter (Signed)
Ins faxed notice that the PA can not be considered because this med is a plan exclusion. Notified wife of status. Dr Tamala Julian, the only two GERD meds I could find in pt's chart are omeprazole and Dexilant.  Do you want to try another? Pantoprazole is almost always covered at a low co-pay, and sometimes I can get Nexium approved. Prevacid is not as often covered.

## 2015-11-12 NOTE — Addendum Note (Signed)
Addended by: Wardell Honour on: 11/12/2015 10:03 AM   Modules accepted: Orders

## 2015-11-12 NOTE — Addendum Note (Signed)
Addended by: Wardell Honour on: 11/12/2015 10:40 AM   Modules accepted: Orders

## 2015-11-12 NOTE — Telephone Encounter (Signed)
Call patient --- I have sent in Protonix 61m bid; let's see if his insurance will cover bid Protonix.  If not, we can try Nexium.

## 2015-11-12 NOTE — Telephone Encounter (Signed)
Pt told his wife that he might just rather go back to QD dosing of Aciphex since the other meds he has tried in the past do not work. Wife reports that they were told by ins that they will cover QD dosing, just not BID. I suggested that maybe pt could take Aciphex QD and then pantoprazole 12 hrs later and plan to pay out of pocket for the pantoprazole if he does not want to try BID of pantoprazole alone. Wife asked if we can change the Rx for Aciphex to QD and send to [facility] so that he can get it right away if he decides not to try only the pantoprazole. Is this OK?

## 2015-11-21 ENCOUNTER — Telehealth: Payer: Self-pay

## 2015-11-21 NOTE — Telephone Encounter (Signed)
PA approved for rabeprazole (Aciphex) through 11/08/16. Notified pharm.

## 2016-02-15 ENCOUNTER — Other Ambulatory Visit: Payer: Self-pay | Admitting: Family Medicine

## 2016-02-19 ENCOUNTER — Encounter: Payer: Self-pay | Admitting: Family Medicine

## 2016-02-19 ENCOUNTER — Ambulatory Visit (INDEPENDENT_AMBULATORY_CARE_PROVIDER_SITE_OTHER): Payer: 59 | Admitting: Family Medicine

## 2016-02-19 VITALS — BP 120/84 | HR 84 | Temp 98.5°F | Resp 16 | Ht 71.0 in | Wt 184.0 lb

## 2016-02-19 DIAGNOSIS — Z716 Tobacco abuse counseling: Secondary | ICD-10-CM

## 2016-02-19 DIAGNOSIS — Z23 Encounter for immunization: Secondary | ICD-10-CM | POA: Diagnosis not present

## 2016-02-19 DIAGNOSIS — K50018 Crohn's disease of small intestine with other complication: Secondary | ICD-10-CM

## 2016-02-19 DIAGNOSIS — Z131 Encounter for screening for diabetes mellitus: Secondary | ICD-10-CM

## 2016-02-19 DIAGNOSIS — J301 Allergic rhinitis due to pollen: Secondary | ICD-10-CM

## 2016-02-19 DIAGNOSIS — I10 Essential (primary) hypertension: Secondary | ICD-10-CM | POA: Diagnosis not present

## 2016-02-19 DIAGNOSIS — N200 Calculus of kidney: Secondary | ICD-10-CM

## 2016-02-19 DIAGNOSIS — Z0000 Encounter for general adult medical examination without abnormal findings: Secondary | ICD-10-CM

## 2016-02-19 DIAGNOSIS — K219 Gastro-esophageal reflux disease without esophagitis: Secondary | ICD-10-CM

## 2016-02-19 DIAGNOSIS — Z13220 Encounter for screening for lipoid disorders: Secondary | ICD-10-CM | POA: Diagnosis not present

## 2016-02-19 DIAGNOSIS — B370 Candidal stomatitis: Secondary | ICD-10-CM

## 2016-02-19 LAB — CBC WITH DIFFERENTIAL/PLATELET
BASOS ABS: 0 {cells}/uL (ref 0–200)
Basophils Relative: 0 %
EOS PCT: 2 %
Eosinophils Absolute: 184 cells/uL (ref 15–500)
HCT: 42 % (ref 38.5–50.0)
Hemoglobin: 14.2 g/dL (ref 13.2–17.1)
LYMPHS ABS: 2944 {cells}/uL (ref 850–3900)
Lymphocytes Relative: 32 %
MCH: 29.3 pg (ref 27.0–33.0)
MCHC: 33.8 g/dL (ref 32.0–36.0)
MCV: 86.6 fL (ref 80.0–100.0)
MONOS PCT: 9 %
MPV: 9.4 fL (ref 7.5–12.5)
Monocytes Absolute: 828 cells/uL (ref 200–950)
NEUTROS ABS: 5244 {cells}/uL (ref 1500–7800)
Neutrophils Relative %: 57 %
PLATELETS: 221 10*3/uL (ref 140–400)
RBC: 4.85 MIL/uL (ref 4.20–5.80)
RDW: 12.8 % (ref 11.0–15.0)
WBC: 9.2 10*3/uL (ref 3.8–10.8)

## 2016-02-19 LAB — POC MICROSCOPIC URINALYSIS (UMFC): Mucus: ABSENT

## 2016-02-19 LAB — POCT URINALYSIS DIP (MANUAL ENTRY)
BILIRUBIN UA: NEGATIVE
BILIRUBIN UA: NEGATIVE
GLUCOSE UA: NEGATIVE
Leukocytes, UA: NEGATIVE
Nitrite, UA: NEGATIVE
PH UA: 5
Protein Ur, POC: NEGATIVE
RBC UA: NEGATIVE
Spec Grav, UA: <=1.005
Urobilinogen, UA: 0.2

## 2016-02-19 LAB — HEMOGLOBIN A1C
HEMOGLOBIN A1C: 5 % (ref <5.7)
Mean Plasma Glucose: 97 mg/dL

## 2016-02-19 LAB — TSH: TSH: 1.22 mIU/L (ref 0.40–4.50)

## 2016-02-19 MED ORDER — LISINOPRIL-HYDROCHLOROTHIAZIDE 20-25 MG PO TABS: ORAL_TABLET | ORAL | 3 refills | Status: DC

## 2016-02-19 MED ORDER — ONDANSETRON 8 MG PO TBDP: 8.0000 mg | ORAL_TABLET | Freq: Three times a day (TID) | ORAL | 3 refills | Status: DC | PRN

## 2016-02-19 MED ORDER — VARENICLINE TARTRATE 0.5 MG X 11 & 1 MG X 42 PO MISC: ORAL | 0 refills | Status: DC

## 2016-02-19 MED ORDER — NYSTATIN 100000 UNIT/ML MT SUSP: 5.0000 mL | Freq: Four times a day (QID) | OROMUCOSAL | 0 refills | Status: DC

## 2016-02-19 MED ORDER — VARENICLINE TARTRATE 1 MG PO TABS
1.0000 mg | ORAL_TABLET | Freq: Two times a day (BID) | ORAL | 2 refills | Status: DC
Start: 2016-02-19 — End: 2016-06-10

## 2016-02-19 MED ORDER — FLUTICASONE PROPIONATE 50 MCG/ACT NA SUSP: 2.0000 | Freq: Every day | NASAL | 3 refills | Status: AC

## 2016-02-19 NOTE — Patient Instructions (Addendum)
IF you received an x-ray today, you will receive an invoice from [facility]. Please contact [facility] at [phone] with questions or concerns regarding your invoice.   IF you received labwork today, you will receive an invoice from [employer]. Please contact [facility] at [phone] with questions or concerns regarding your invoice.   Our billing staff will not be able to assist you with questions regarding bills from these companies.  You will be contacted with the lab results as soon as they are available. The fastest way to get your results is to activate your My Chart account. Instructions are located on the last page of this paperwork. If you have not heard from [place] regarding the results in 2 weeks, please contact this office.    Keeping you healthy  Get these tests  Blood pressure- Have your blood pressure checked once a year by your healthcare provider.  Normal blood pressure is 120/80.  Weight- Have your body mass index (BMI) calculated to screen for obesity.  BMI is a measure of body fat based on height and weight. You can also calculate your own BMI at GravelBags.it.  Cholesterol- Have your cholesterol checked regularly starting at age 53, sooner may be necessary if you have diabetes, high blood pressure, if a family member developed heart diseases at an early age or if you smoke.   Chlamydia, HIV, and other sexual transmitted disease- Get screened each year until the age of 73 then within three months of each new sexual partner.  Diabetes- Have your blood sugar checked regularly if you have high blood pressure, high cholesterol, a family history of diabetes or if you are overweight.  Get these vaccines  Flu shot- Every fall.  Tetanus shot- Every 10 years.  Menactra- Single dose; prevents meningitis.  Take these steps  Don't smoke- If you do smoke, ask your healthcare provider about quitting. For tips on  how to quit, go to www.smokefree.gov or call 1-800-QUIT-NOW.  Be physically active- Exercise 5 days a week for at least 30 minutes.  If you are not already physically active start slow and gradually work up to 30 minutes of moderate physical activity.  Examples of moderate activity include walking briskly, mowing the yard, dancing, swimming bicycling, etc.  Eat a healthy diet- Eat a variety of healthy foods such as fruits, vegetables, low fat milk, low fat cheese, yogurt, lean meats, poultry, fish, beans, tofu, etc.  For more information on healthy eating, go to www.thenutritionsource.org  Drink alcohol in moderation- Limit alcohol intake two drinks or less a day.  Never drink and drive.  Dentist- Brush and floss teeth twice daily; visit your dentis twice a year.  Depression-Your emotional health is as important as your physical health.  If you're feeling down, losing interest in things you normally enjoy please talk with your healthcare provider.  Gun Safety- If you keep a gun in your home, keep it unloaded and with the safety lock on.  Bullets should be stored separately.  Helmet use- Always wear a helmet when riding a motorcycle, bicycle, rollerblading or skateboarding.  Safe sex- If you may be exposed to a sexually transmitted infection, use a condom  Seat belts- Seat bels can save your life; always wear one.  Smoke/Carbon Monoxide detectors- These detectors need to be installed on the appropriate level of your home.  Replace batteries at least once a year.  Skin Cancer- When out in the sun, cover up and use sunscreen SPF  15 or higher.  Violence- If anyone is threatening or hurting you, please tell your healthcare provider.

## 2016-02-19 NOTE — Progress Notes (Signed)
By signing my name below, I, Mesha Guinyard, attest that this documentation has been prepared under the direction and in the presence of [employer]  Electronically Signed: Verlee Monte, Medical Scribe. 02/19/2016. 3:06 PM.  Subjective:    Patient ID: Leroy Herrera, male    DOB: 12/12/1980, 35 y.o.   MRN: [id number]  02/19/2016  Annual Exam (pt would like flu shot)  HPI  HPI Comments: Leroy Herrera is a 35 y.o. male who presents to the [facility] for complete physical.  Crohn's Dx: Pt mentions he's had abdominal pain, and nausea since Feb. Pt went to he ED and was put on steroids. Pt reports a 3-4 day flare of stomach pain in July. Pt states he was nauseous, but he wasn't throwing up. Pt is compliant with humira, bentyl, and he has a large amount of prednisone he uses PRN when the flares present itself. Pt uses bentyl QD, and has been on humira for a year. Pt mentions protonix works 50% of the time. Pt feels like his tongue is raw all the time and his papules comes off in cycles- pt suspects this is due to humira. Pt wakes up at 5 am to use the bathroom, and has a regular bm several times a day. Pt drinks a couple of times in the year, but not often since it causes him stomach pain. Pt isn't sure if he has melena, but mentions he would know how to tell the difference if he had bloody stool. Pt denies melena, and emesis.   Hip Pain: Pt mentions he's had a burning pain in his hip and that eventually went away. Pt described the pain as a burning sensation.  Cardio: Pt has palpitations once every year, but this has been the case ever since he was a kid. Pt has episodic restlegs and it occasionally disturbs his sleep. Pt states she   Cancer Screening: Prostate: Pt denies having a problem with his sex drive. FHx: none Colon CA: Pt had a colonoscopy last year and the physician found lots of inflammation.  Immunizations: Pt would like to get his flu shot and his PNA  vaccine.  Immunization History  Administered Date(s) Administered  . Influenza Split 03/25/2012  . Influenza,inj,Quad PF,36+ Mos 03/28/2013, 09/12/2015, 02/19/2016  . Pneumococcal Polysaccharide-23 02/19/2016  . 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 Pt denies tinnitus   Visual Acuity Screening   Right eye Left eye Both eyes  Without correction:     With correction: 20/30 20/25 20/30    Dentist: Pt doesn't have a dentist to follow him. Pt had all of his teeth removed several years ago.  Depression: Pt denies dysphoric mood. Depression screen [facility] 2/9 02/19/2016 10/02/2015 09/12/2015 11/22/2014 12/21/2013  Decreased Interest 0 0 0 0 0  Down, Depressed, Hopeless 0 0 0 0 0  PHQ - 2 Score 0 0 0 0 0   SHx: Pt smokes 1.5 pack a day and has been smoking since he was 35 years old. Pt has a smokers cough. Pt denies chest pain, SOB. Pt's dad had lung CA although he hasn't smoke a day in his life. Pt's dad worked in [employer] and suspects his lung Ca was due to that.  Review of  Systems 13 point ROS negative. Past Medical History:  Diagnosis Date  . Allergy   . Crohn's disease ([place])   . Heroin addiction ([place]) 06/23/2009   Remission since 2011.  Marland Kitchen Hypertension   . Pancreatitis   . Renal disorder    Nephrolithiasis   Past Surgical History:  Procedure Laterality Date  . APPENDECTOMY    . BOWEL RESECTION    . CHOLECYSTECTOMY    . COLON SURGERY    . TONSILLECTOMY     Allergies  Allergen Reactions  . Prednisone Anaphylaxis    High dose allergy  . Vicodin [Hydrocodone-Acetaminophen] Hives and Itching  . Morphine And Related Hives    Social History   Social History  . Marital status: Married    Spouse name: Estill Bamberg  . Number of children: 2  . Years of education: N/A   Occupational History  .  auto Dealer    Social History Main Topics  . Smoking status: Current Every Day Smoker    Packs/day: 1.50  . Smokeless tobacco: Never Used  . Alcohol use Yes     Comment: occasional  . Drug use: No     Comment: PREVIOUS HEROIN ADDICTION 2011; RECOVERY SINCE 2011.  Marland Kitchen Sexual activity: Yes   Other Topics Concern  . Not on file   Social History Narrative   Marital status: Married x 5 years; second marriage. Divorced from first marriage due to heroin addiction.      Children: one son (105 yo [place]) lives with patient and wife; one daughter Larena Glassman) who lives with mother. Gets every Thursday night; then has Thursday to Sunday the following week.      Lives: with wife, son.      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  Narcotic abuse/overuse/misuse in past.      Exercise: none in 2017      Seatbelt: 100%; no texting.       Family History  Problem Relation Age of Onset  . Diabetes Mother   . Arthritis Mother     knee replacement B  . Diabetes Father   . Hyperlipidemia Father   . Hypertension Father   . COPD Father     non-smoker; mill work.  Marland Kitchen Heart disease Father     CHF  . Hypertension Brother        Objective:    BP 120/84 (BP Location: Left Arm, Patient Position: Sitting, Cuff Size: Normal)   Pulse 84   Temp 98.5 F (36.9 C) (Oral)   Resp 16   Ht 5' 11"  (1.803 m)   Wt 184 lb (83.5 kg)   SpO2 99%   BMI 25.66 kg/m  Physical Exam  Constitutional: He is oriented to person, place, and time. He appears well-developed and well-nourished. No distress.  HENT:  Head: Normocephalic and atraumatic.  Right Ear: External ear normal.  Left Ear: External ear normal.  Nose: Nose normal.  Mouth/Throat: Oropharynx is clear and moist.  Eyes: Conjunctivae and EOM are normal. Pupils are equal, round, and reactive to light.  Neck: Normal  range of motion. Neck supple. Carotid bruit is not present. No thyromegaly present.  Cardiovascular: Normal rate, regular rhythm, normal heart sounds and intact distal pulses.  Exam reveals no gallop and no friction rub.   No murmur  heard. Pulmonary/Chest: Effort normal and breath sounds normal. He has no wheezes. He has no rales.  Abdominal: Soft. Bowel sounds are normal. He exhibits no distension and no mass. There is no tenderness. There is no rebound and no guarding.  Genitourinary: Penis normal.  Musculoskeletal:       Right shoulder: Normal.       Left shoulder: Normal.       Cervical back: Normal.  Lymphadenopathy:    He has no cervical adenopathy.  Neurological: He is alert and oriented to person, place, and time. He has normal reflexes. No cranial nerve deficit. He exhibits normal muscle tone. Coordination normal.  Skin: Skin is warm and dry. No rash noted. He is not diaphoretic.  Psychiatric: He has a normal mood and affect. His behavior is normal. Judgment and thought content normal.         Assessment & Plan:   1. Routine physical examination   2. Essential hypertension, benign   3. Crohn's disease of small intestine with other complication ([place])   4. Gastroesophageal reflux disease, esophagitis presence not specified   5. Nephrolithiasis   6. Screening for diabetes mellitus   7. Screening, lipid   8. Need for prophylactic vaccination and inoculation against influenza   9. Tobacco abuse counseling   10. Thrush   11. Allergic rhinitis due to pollen     Orders Placed This Encounter  Procedures  . Pneumococcal polysaccharide vaccine 23-valent greater than or equal to 2yo subcutaneous/IM  . Flu Vaccine QUAD 36+ mos IM  . CBC with Differential/Platelet  . Comprehensive metabolic panel    Order Specific Question:   Has the patient fasted?    Answer:   Yes  . Lipid panel    Order Specific Question:   Has the patient fasted?    Answer:   Yes  . Iron  . TSH  .  Hemoglobin A1c  . Vitamin B12  . VITAMIN D 25 Hydroxy (Vit-D Deficiency, Fractures)  . POCT urinalysis dipstick  . POCT Microscopic Urinalysis ([facility])  . EKG 12-Lead   Meds ordered this encounter  Medications  . nystatin (MYCOSTATIN) 100000 UNIT/ML suspension    Sig: Take 5 mLs (500,000 Units total) by mouth 4 (four) times daily.    Dispense:  200 mL    Refill:  0  . ondansetron (ZOFRAN-ODT) 8 MG disintegrating tablet    Sig: Take 1 tablet (8 mg total) by mouth every 8 (eight) hours as needed for nausea.    Dispense:  60 tablet    Refill:  3  . lisinopril-hydrochlorothiazide (PRINZIDE,ZESTORETIC) 20-25 MG tablet    Sig: Take 1 tablet by mouth  daily    Dispense:  90 tablet    Refill:  3  . fluticasone (FLONASE) 50 MCG/ACT nasal spray    Sig: Place 2 sprays into both nostrils daily.    Dispense:  48 g    Refill:  3  . varenicline (CHANTIX STARTING MONTH PAK) 0.5 MG X 11 & 1 MG X 42 tablet    Sig: Take one 0.5 mg tablet by mouth once daily for 3 days, then increase to one 0.5 mg tablet twice daily for 4 days, then increase to one 1 mg tablet twice daily.    Dispense:  53 tablet    Refill:  0  . varenicline (CHANTIX CONTINUING MONTH PAK) 1 MG tablet    Sig: Take 1 tablet (1 mg total) by mouth 2 (two) times daily.    Dispense:  60 tablet    Refill:  2    Return in about 6 months (around 08/20/2016) for recheck high blood pressure.  I personally performed the services described in this documentation, which was scribed in my presence. The recorded information has been reviewed and considered.  Lajoya Dombek Elayne Guerin, M.D. [facility] [address] [place], [place]  [id number] [phone] phone [phone] fax

## 2016-02-20 LAB — LIPID PANEL
CHOL/HDL RATIO: 6 ratio — AB (ref <=5.0)
CHOLESTEROL: 211 mg/dL — AB (ref 125–200)
HDL: 35 mg/dL — AB (ref >=40)
LDL Cholesterol: 102 mg/dL (ref <130)
Triglycerides: 369 mg/dL — ABNORMAL HIGH (ref <150)
VLDL: 74 mg/dL — ABNORMAL HIGH (ref <30)

## 2016-02-20 LAB — COMPREHENSIVE METABOLIC PANEL
ALK PHOS: 78 U/L (ref 40–115)
ALT: 20 U/L (ref 9–46)
AST: 17 U/L (ref 10–40)
Albumin: 4.5 g/dL (ref 3.6–5.1)
BILIRUBIN TOTAL: 0.3 mg/dL (ref 0.2–1.2)
BUN: 9 mg/dL (ref 7–25)
CHLORIDE: 99 mmol/L (ref 98–110)
CO2: 25 mmol/L (ref 20–31)
Calcium: 9.4 mg/dL (ref 8.6–10.3)
Creat: 1.1 mg/dL (ref 0.60–1.35)
Glucose, Bld: 85 mg/dL (ref 65–99)
Potassium: 3.8 mmol/L (ref 3.5–5.3)
SODIUM: 136 mmol/L (ref 135–146)
TOTAL PROTEIN: 6.8 g/dL (ref 6.1–8.1)

## 2016-02-20 LAB — VITAMIN B12: VITAMIN B 12: 384 pg/mL (ref 200–1100)

## 2016-02-20 LAB — VITAMIN D 25 HYDROXY (VIT D DEFICIENCY, FRACTURES): Vit D, 25-Hydroxy: 31 ng/mL (ref 30–100)

## 2016-02-20 LAB — IRON: Iron: 66 ug/dL (ref 50–180)

## 2016-03-21 DIAGNOSIS — J301 Allergic rhinitis due to pollen: Secondary | ICD-10-CM | POA: Insufficient documentation

## 2016-04-07 ENCOUNTER — Other Ambulatory Visit (HOSPITAL_COMMUNITY): Payer: Self-pay | Admitting: Gastroenterology

## 2016-04-07 DIAGNOSIS — K50919 Crohn's disease, unspecified, with unspecified complications: Secondary | ICD-10-CM

## 2016-04-10 ENCOUNTER — Ambulatory Visit (HOSPITAL_COMMUNITY)
Admission: RE | Admit: 2016-04-10 | Discharge: 2016-04-10 | Disposition: A | Discharge status: Home / Self Care | Payer: 59 | Source: Ambulatory Visit | Attending: Gastroenterology | Admitting: Gastroenterology

## 2016-04-10 DIAGNOSIS — Z888 Allergy status to other drugs, medicaments and biological substances status: Secondary | ICD-10-CM | POA: Diagnosis not present

## 2016-04-10 DIAGNOSIS — R933 Abnormal findings on diagnostic imaging of other parts of digestive tract: Secondary | ICD-10-CM | POA: Insufficient documentation

## 2016-04-10 DIAGNOSIS — Z885 Allergy status to narcotic agent status: Secondary | ICD-10-CM | POA: Diagnosis not present

## 2016-04-10 DIAGNOSIS — K5090 Crohn's disease, unspecified, without complications: Secondary | ICD-10-CM | POA: Insufficient documentation

## 2016-04-10 DIAGNOSIS — K50919 Crohn's disease, unspecified, with unspecified complications: Secondary | ICD-10-CM

## 2016-05-01 ENCOUNTER — Telehealth: Payer: Self-pay

## 2016-05-01 DIAGNOSIS — K5010 Crohn's disease of large intestine without complications: Secondary | ICD-10-CM

## 2016-05-01 NOTE — Telephone Encounter (Signed)
Pt wife calling in regards to needing a new referral placed for Dr.Hung his expired and needs to continue seeing him   Contact:[phone]

## 2016-05-03 NOTE — Telephone Encounter (Signed)
Patient needs updated referral to GI, have pended, he has Crohns disease, please advise

## 2016-05-08 ENCOUNTER — Telehealth: Payer: Self-pay

## 2016-05-08 NOTE — Telephone Encounter (Signed)
Pt wife is calling antonios has a consult today with [facility] to discuss possible bowel reconstructive surgery for his chrohns he has a blockage and she is calling stating she will need a referral for general surgery in order for insurance to cover it Dr. Benson Norway the GI doctor has sent him here his appointment is today   Thanks  Contact [phone]

## 2016-05-12 NOTE — Telephone Encounter (Signed)
Call patient --- Dr. Benson Norway should place referral if he referred patient to [facility].  I have no records of this referral or need for this referral at this time.

## 2016-05-13 ENCOUNTER — Telehealth: Payer: Self-pay | Admitting: Emergency Medicine

## 2016-05-13 NOTE — Telephone Encounter (Signed)
Left message to return call 

## 2016-05-14 NOTE — Telephone Encounter (Signed)
Pt's appt with [facility] is past, [facility] probably straightened this out with Dr Ulyses Amor office. Normally if specialist needs a referral from PCP office they will contact our Referrals dept.

## 2016-05-23 ENCOUNTER — Other Ambulatory Visit: Payer: Self-pay

## 2016-05-23 MED ORDER — RABEPRAZOLE SODIUM 20 MG PO TBEC: 20.0000 mg | DELAYED_RELEASE_TABLET | Freq: Every day | ORAL | 0 refills | Status: DC

## 2016-05-23 NOTE — Telephone Encounter (Signed)
[facility] office called requesting insurance authorization for pts visit on 04/07/16. As noted in previous msg, our office was not notified of need for new auth until after this visit had occurred. Our last referral was sent on 07/27/15 and appropriate auth was given at the time. Pt's insurance does not accept retro-auth. Pt will be responsible for payment as the visit was preformed by another office without authorization.

## 2016-05-23 NOTE — Telephone Encounter (Signed)
Sent in 90 days supply without refill. With note pt needs follow up prior to further refills/

## 2016-05-30 ENCOUNTER — Encounter: Payer: Self-pay | Admitting: Family Medicine

## 2016-06-05 ENCOUNTER — Ambulatory Visit (INDEPENDENT_AMBULATORY_CARE_PROVIDER_SITE_OTHER): Payer: 59

## 2016-06-05 ENCOUNTER — Ambulatory Visit (INDEPENDENT_AMBULATORY_CARE_PROVIDER_SITE_OTHER): Payer: 59 | Admitting: Family Medicine

## 2016-06-05 VITALS — BP 118/76 | HR 88 | Temp 98.1°F | Ht 71.0 in | Wt 189.4 lb

## 2016-06-05 DIAGNOSIS — R0789 Other chest pain: Secondary | ICD-10-CM

## 2016-06-05 DIAGNOSIS — R042 Hemoptysis: Secondary | ICD-10-CM

## 2016-06-05 DIAGNOSIS — K219 Gastro-esophageal reflux disease without esophagitis: Secondary | ICD-10-CM | POA: Diagnosis not present

## 2016-06-05 DIAGNOSIS — Z720 Tobacco use: Secondary | ICD-10-CM | POA: Diagnosis not present

## 2016-06-05 DIAGNOSIS — F5101 Primary insomnia: Secondary | ICD-10-CM

## 2016-06-05 MED ORDER — DEXLANSOPRAZOLE 60 MG PO CPDR: 60.0000 mg | DELAYED_RELEASE_CAPSULE | Freq: Every day | ORAL | 11 refills | Status: DC

## 2016-06-05 MED ORDER — SUCRALFATE 1 GM/10ML PO SUSP: 1.0000 g | Freq: Three times a day (TID) | ORAL | 4 refills | Status: DC

## 2016-06-05 MED ORDER — RAMELTEON 8 MG PO TABS: 8.0000 mg | ORAL_TABLET | Freq: Every day | ORAL | 5 refills | Status: DC

## 2016-06-05 NOTE — Progress Notes (Signed)
Subjective:    Patient ID: Leroy Herrera, male    DOB: 18-Feb-1981, 35 y.o.   MRN: [id number]  06/05/2016  Cough (cough uo blood  X 1 Week)   HPI This 35 y.o. male presents for evaluation of coughing up blood.  Last few mornings, will cough as usual due to chronic smoker's cough.  Having more blood than normal; clear mucous comes up; this week there is a lot of red blood in mucous.  Attributing to smoking; also having weird smoking. Trying to cut back and quit before smoking.  Really thick mucous with bright red quarter size; the other day even more blood.  No acute illness.  No fever/chills/sweats.  No night sweats.  Having this weird pain in upper abdomen/epigastric; sharp and radiates up into chest bilateral.  Chest pain intermittent; non-exertional; severe pain; sharp; no pressure.  Intermittent nausea.  Has been eating lately.  Having really bad heartburn.  Taking Protonix bid and still having heartburn still; also eating Zantac 4-5 per day. Taking a lot of Ibuprofen; worried about an ulcer.  Sharp burning in belly.  Feels like pain in stomach is causing chest pain.  Up all night due to eating.  Does not eat lunch or breakfast; last night ate all night; pressure and swelling in abdomen.  No melena.  No EGD in a long time.  S/p colonoscopy.   Failed PRILOSEC, [place], [place].  Crohn's disease: has worsened recently; having a lot of stomach pain and sickness; plans to undergo surgery; Humira is working well; has a stricture that needs repair; s/p surgery consultation three weeks ago; recommended resection at site of stricture. Previous surgery age 44 years old.  Recovery really difficult.  Must save money to have surgery; February or March 2018.  Pain is intermittent with pain and pressure. Humira x 1 year.  At last visit, inflammation looks really good.    Insomnia: suffering 5/7 nights per week. Nyquil and Unisom without improvement; previous Lunesta but having weird dreams.  Hallucinated  with Ambien.    Immunization History  Administered Date(s) Administered  . Influenza Split 03/25/2012  . Influenza,inj,Quad PF,36+ Mos 03/28/2013, 09/12/2015, 02/19/2016  . PPD Test 06/05/2016  . Pneumococcal Polysaccharide-23 02/19/2016  . Td 06/24/2011   BP Readings from Last 3 Encounters:  06/05/16 118/76  02/19/16 120/84  10/02/15 116/76   Wt Readings from Last 3 Encounters:  06/05/16 189 lb 6.4 oz (85.9 kg)  02/19/16 184 lb (83.5 kg)  10/02/15 173 lb 9.6 oz (78.7 kg)     Review of Systems  Constitutional: Negative for activity change, appetite change, chills, diaphoresis, fatigue and fever.  HENT: Positive for congestion, postnasal drip and rhinorrhea. Negative for nosebleeds, sinus pain, sinus pressure, sore throat, trouble swallowing and voice change.   Eyes: Negative for visual disturbance.  Respiratory: Positive for cough. Negative for shortness of breath.   Cardiovascular: Negative for chest pain, palpitations and leg swelling.  Gastrointestinal: Positive for abdominal pain.  Endocrine: Negative for cold intolerance, heat intolerance, polydipsia, polyphagia and polyuria.  Neurological: Negative for dizziness, tremors, seizures, syncope, facial asymmetry, speech difficulty, weakness, light-headedness, numbness and headaches.  Psychiatric/Behavioral: Positive for sleep disturbance. Negative for dysphoric mood. The patient is not nervous/anxious.     Past Medical History:  Diagnosis Date  . Allergy   . Crohn's disease ([place])   . Heroin addiction ([place]) 06/23/2009   Remission since 2011.  Marland Kitchen Hypertension   . Pancreatitis   . Renal disorder    Nephrolithiasis  Past Surgical History:  Procedure Laterality Date  . APPENDECTOMY    . BOWEL RESECTION    . CHOLECYSTECTOMY    . COLON SURGERY    . TONSILLECTOMY     Allergies  Allergen Reactions  . Prednisone Anaphylaxis    High dose allergy  . Vicodin [Hydrocodone-Acetaminophen] Hives and Itching  . Morphine And  Related Hives    Social History   Social History  . Marital status: Married    Spouse name: Estill Bamberg  . Number of children: 2  . Years of education: N/A   Occupational History  . auto Dealer    Social History Main Topics  . Smoking status: Current Every Day Smoker    Packs/day: 1.50  . Smokeless tobacco: Never Used  . Alcohol use Yes     Comment: occasional  . Drug use: No     Comment: PREVIOUS HEROIN ADDICTION 2011; RECOVERY SINCE 2011.  Marland Kitchen Sexual activity: Yes   Other Topics Concern  . Not on file   Social History Narrative   Marital status: Married x 5 years; second marriage. Divorced from first marriage due to heroin addiction.      Children: one son (52 yo [place]) lives with patient and wife; one daughter Larena Glassman) who lives with mother. Gets every Thursday night; then has Thursday to Sunday the following week.      Lives: with wife, son.      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  Narcotic abuse/overuse/misuse in past.      Exercise: none in 2017      Seatbelt: 100%; no texting.       Family History  Problem Relation Age of Onset  . Diabetes Mother   . Arthritis Mother     knee replacement B  . Diabetes Father   . Hyperlipidemia Father   . Hypertension Father   . COPD Father     non-smoker; mill work.  Marland Kitchen Heart disease Father     CHF  . Hypertension Brother        Objective:    BP 118/76 (BP Location: Right Arm, Patient Position: Sitting, Cuff Size: Small)   Pulse 88   Temp 98.1 F (36.7 C) (Oral)   Ht 5' 11"  (1.803 m)   Wt 189 lb 6.4 oz (85.9 kg)   SpO2 98%   BMI 26.42 kg/m  Physical Exam  Constitutional: He is oriented to person, place, and time. He appears well-developed and well-nourished. No distress.  HENT:  Head: Normocephalic and atraumatic.  Right Ear: External ear normal.  Left Ear: External  ear normal.  Nose: Nose normal.  Mouth/Throat: Oropharynx is clear and moist.  Eyes: Conjunctivae and EOM are normal. Pupils are equal, round, and reactive to light.  Neck: Normal range of motion. Neck supple. Carotid bruit is not present. No thyromegaly present.  Cardiovascular: Normal rate, regular rhythm, normal heart sounds and intact distal pulses.  Exam reveals no gallop and no friction rub.   No murmur heard. Pulmonary/Chest: Effort normal and breath sounds normal. He has no wheezes. He has no rales.  Abdominal: Soft. Bowel sounds are normal. He exhibits no distension and no mass. There is no tenderness. There is no rebound and no guarding.  Lymphadenopathy:    He has no cervical adenopathy.  Neurological: He is alert and oriented to person, place, and time. No cranial nerve deficit.  Skin: Skin is warm and dry. No rash noted. He is not diaphoretic.  Psychiatric: He has a normal mood and affect. His behavior is normal.  Nursing note and vitals reviewed.  Results for orders placed or performed in visit on 06/05/16  CBC with Differential/Platelet  Result Value Ref Range   WBC 15.6 (H) 3.4 - 10.8 x10E3/uL   RBC 5.06 4.14 - 5.80 x10E6/uL   Hemoglobin 14.7 13.0 - 17.7 g/dL   Hematocrit 45.0 37.5 - 51.0 %   MCV 89 79 - 97 fL   MCH 29.1 26.6 - 33.0 pg   MCHC 32.7 31.5 - 35.7 g/dL   RDW 13.6 12.3 - 15.4 %   Platelets 296 150 - 379 x10E3/uL   Neutrophils 70 Not Estab. %   Lymphs 21 Not Estab. %   Monocytes 8 Not Estab. %   Eos 0 Not Estab. %   Basos 0 Not Estab. %   Neutrophils Absolute 11.0 (H) 1.4 - 7.0 x10E3/uL   Lymphocytes Absolute 3.2 (H) 0.7 - 3.1 x10E3/uL   Monocytes Absolute 1.2 (H) 0.1 - 0.9 x10E3/uL   EOS (ABSOLUTE) 0.1 0.0 - 0.4 x10E3/uL   Basophils Absolute 0.0 0.0 - 0.2 x10E3/uL   Immature Granulocytes 1 Not Estab. %   Immature Grans (Abs) 0.1 0.0 - 0.1 x10E3/uL   Hematology Comments: Note:   Comprehensive metabolic panel  Result Value Ref Range   Glucose 99 65  - 99 mg/dL   BUN 14 6 - 20 mg/dL   Creatinine, Ser 1.02 0.76 - 1.27 mg/dL   GFR calc non Af Amer 93 >59 mL/min/1.73   GFR calc Af Amer 108 >59 mL/min/1.73   BUN/Creatinine Ratio 14 9 - 20   Sodium 134 134 - 144 mmol/L   Potassium 4.6 3.5 - 5.2 mmol/L   Chloride 95 (L) 96 - 106 mmol/L   CO2 22 18 - 29 mmol/L   Calcium 10.0 8.7 - 10.2 mg/dL   Total Protein 6.9 6.0 - 8.5 g/dL   Albumin 4.8 3.5 - 5.5 g/dL   Globulin, Total 2.1 1.5 - 4.5 g/dL   Albumin/Globulin Ratio 2.3 (H) 1.2 - 2.2   Bilirubin Total <0.2 0.0 - 1.2 mg/dL   Alkaline Phosphatase 98 39 - 117 IU/L   AST 15 0 - 40 IU/L   ALT 27 0 - 44 IU/L  TB Skin Test  Result Value Ref Range   TB Skin Test Negative    Induration 11m mm   No results found. EKG: NSR; no acute processes    Assessment & Plan:   1. Hemoptysis   2. Other chest pain   3. Tobacco abuse   4. Gastroesophageal reflux disease without esophagitis   5. Primary insomnia     Orders Placed This Encounter  Procedures  . DG Chest 2 View    Standing Status:   Future    Number of Occurrences:   1    Standing Expiration Date:   06/05/2017    Order Specific Question:   Reason for Exam (SYMPTOM  OR DIAGNOSIS REQUIRED)    Answer:   hemoptysis in smoker    Order Specific Question:   Preferred imaging location?    Answer:   External  . CT Chest W Contrast    EPIC ORDER/WT-191LBS/NO NOT DIAN OR PRE DIAB/NO KIDNEY PROBLEMS/NKDA TO CT CM/NO NEEDS/INS-[employer]HC/CLC/PT    Standing Status:   Future  Standing Expiration Date:   08/06/2017    Scheduling Instructions:     Please schedule Thursday or Friday 12/21 or 12/22.    Order Specific Question:   If indicated for the ordered procedure, I authorize the administration of contrast media per Radiology protocol    Answer:   Yes    Order Specific Question:   Reason for Exam (SYMPTOM  OR DIAGNOSIS REQUIRED)    Answer:   hemoptysis in smoker    Order Specific Question:   Preferred imaging location?    Answer:   [facility]  . CBC with Differential/Platelet  . Comprehensive metabolic panel  . TB Skin Test    Order Specific Question:   Has patient ever tested positive?    Answer:   No  . EKG 12-Lead   Meds ordered this encounter  Medications  . dexlansoprazole (DEXILANT) 60 MG capsule    Sig: Take 1 capsule (60 mg total) by mouth daily.    Dispense:  30 capsule    Refill:  11  . sucralfate (CARAFATE) 1 GM/10ML suspension    Sig: Take 10 mLs (1 g total) by mouth 4 (four) times daily -  with meals and at bedtime.    Dispense:  420 mL    Refill:  4  . DISCONTD: ramelteon (ROZEREM) 8 MG tablet    Sig: Take 1 tablet (8 mg total) by mouth at bedtime.    Dispense:  30 tablet    Refill:  5    No Follow-up on file.   Shayann Garbutt Elayne Guerin, M.D. [facility] [address] [place], [place]  [address] [phone] phone [phone] fax

## 2016-06-05 NOTE — Patient Instructions (Addendum)
1. Start [employer] or nasal saline once daily. 2. STOP Ibuprofen.    IF you received an x-ray today, you will receive an invoice from [facility] Radiology. Please contact [facility] Radiology at [phone] with questions or concerns regarding your invoice.   IF you received labwork today, you will receive an invoice from [employer]. Please contact [facility] at [phone] with questions or concerns regarding your invoice.   Our billing staff will not be able to assist you with questions regarding bills from these companies.  You will be contacted with the lab results as soon as they are available. The fastest way to get your results is to activate your My Chart account. Instructions are located on the last page of this paperwork. If you have not heard from [place] regarding the results in 2 weeks, please contact this office.

## 2016-06-06 ENCOUNTER — Telehealth: Payer: Self-pay

## 2016-06-06 LAB — CBC WITH DIFFERENTIAL/PLATELET
BASOS ABS: 0 10*3/uL (ref 0.0–0.2)
Basos: 0 %
EOS (ABSOLUTE): 0.1 10*3/uL (ref 0.0–0.4)
EOS: 0 %
HEMOGLOBIN: 14.7 g/dL (ref 13.0–17.7)
Hematocrit: 45 % (ref 37.5–51.0)
IMMATURE GRANS (ABS): 0.1 10*3/uL (ref 0.0–0.1)
Immature Granulocytes: 1 %
LYMPHS ABS: 3.2 10*3/uL — AB (ref 0.7–3.1)
LYMPHS: 21 %
MCH: 29.1 pg (ref 26.6–33.0)
MCHC: 32.7 g/dL (ref 31.5–35.7)
MCV: 89 fL (ref 79–97)
MONOCYTES: 8 %
Monocytes Absolute: 1.2 10*3/uL — ABNORMAL HIGH (ref 0.1–0.9)
NEUTROS ABS: 11 10*3/uL — AB (ref 1.4–7.0)
Neutrophils: 70 %
PLATELETS: 296 10*3/uL (ref 150–379)
RBC: 5.06 x10E6/uL (ref 4.14–5.80)
RDW: 13.6 % (ref 12.3–15.4)
WBC: 15.6 10*3/uL — ABNORMAL HIGH (ref 3.4–10.8)

## 2016-06-06 LAB — COMPREHENSIVE METABOLIC PANEL
ALBUMIN: 4.8 g/dL (ref 3.5–5.5)
ALT: 27 IU/L (ref 0–44)
AST: 15 IU/L (ref 0–40)
Albumin/Globulin Ratio: 2.3 — ABNORMAL HIGH (ref 1.2–2.2)
Alkaline Phosphatase: 98 IU/L (ref 39–117)
BUN / CREAT RATIO: 14 (ref 9–20)
BUN: 14 mg/dL (ref 6–20)
Bilirubin Total: <0.2 mg/dL (ref 0.0–1.2)
CALCIUM: 10 mg/dL (ref 8.7–10.2)
CO2: 22 mmol/L (ref 18–29)
Chloride: 95 mmol/L — ABNORMAL LOW (ref 96–106)
Creatinine, Ser: 1.02 mg/dL (ref 0.76–1.27)
GFR, EST AFRICAN AMERICAN: 108 mL/min/{1.73_m2} (ref >59)
GFR, EST NON AFRICAN AMERICAN: 93 mL/min/{1.73_m2} (ref >59)
GLUCOSE: 99 mg/dL (ref 65–99)
Globulin, Total: 2.1 g/dL (ref 1.5–4.5)
Potassium: 4.6 mmol/L (ref 3.5–5.2)
Sodium: 134 mmol/L (ref 134–144)
TOTAL PROTEIN: 6.9 g/dL (ref 6.0–8.5)

## 2016-06-06 NOTE — Telephone Encounter (Signed)
Pt was seen by Dr. Tamala Julian on 06/06/16 and he is under the impression he was going to be prescribed a sleep medication. He would like [place] to use Pharmacy:  [place], [place] - [address]. Please advise at [phone]

## 2016-06-07 ENCOUNTER — Ambulatory Visit (INDEPENDENT_AMBULATORY_CARE_PROVIDER_SITE_OTHER): Payer: 59 | Admitting: Physician Assistant

## 2016-06-07 DIAGNOSIS — Z111 Encounter for screening for respiratory tuberculosis: Secondary | ICD-10-CM

## 2016-06-07 LAB — TB SKIN TEST: TB SKIN TEST: NEGATIVE

## 2016-06-07 NOTE — Progress Notes (Signed)
Patient here for PPD read.

## 2016-06-09 MED ORDER — RAMELTEON 8 MG PO TABS: 8.0000 mg | ORAL_TABLET | Freq: Every day | ORAL | 5 refills | Status: DC

## 2016-06-09 NOTE — Telephone Encounter (Signed)
I prescribed Rozerem 86m qhs at his visit; see note for details; I escribed medication again.  Please call pharmacy and confirm that they have received rx.

## 2016-06-10 ENCOUNTER — Other Ambulatory Visit: Payer: Self-pay | Admitting: Family Medicine

## 2016-06-10 MED ORDER — VARENICLINE TARTRATE 0.5 MG X 11 & 1 MG X 42 PO MISC: ORAL | 0 refills | Status: DC

## 2016-06-10 MED ORDER — VARENICLINE TARTRATE 1 MG PO TABS: 1.0000 mg | ORAL_TABLET | Freq: Two times a day (BID) | ORAL | 0 refills | Status: DC

## 2016-06-10 NOTE — Telephone Encounter (Signed)
Spoke to wife and she is has not checked back since yesterday to see if they got the rx Dr Tamala Julian re-sent then. I advised I would call them and make sure they received it. Pharm reported that it needs a PA. Wife checked w/pt and he reported that he has tried/failed OTC Nyquil Z and Sominex. Also tried Lunesta in the past. Completed PA on covermymeds. Pending.  Wife also asked me to re-send pt's Chantix Rxs to mail order for $0 cost. Pt has not started on these yet and would like to. Done.

## 2016-06-11 ENCOUNTER — Telehealth: Payer: Self-pay

## 2016-06-11 ENCOUNTER — Other Ambulatory Visit: Payer: Self-pay | Admitting: Family Medicine

## 2016-06-11 NOTE — Telephone Encounter (Signed)
[facility] called to let us know that Josem Kaufmann was denied for upcoming MRI. Dr Tamala Julian will call this evening to preform a peer to peer. Called patients wife to let her know.

## 2016-06-13 ENCOUNTER — Other Ambulatory Visit: Payer: 59

## 2016-06-13 NOTE — Telephone Encounter (Signed)
Spoke with [facility] physician; approved CT chest [phone].  Good for 45 days.  Please advise patient and imaging facility of approval.

## 2016-06-13 NOTE — Telephone Encounter (Signed)
This message was never routed to provider.  Called [facility] regarding denial of CT chest with contrast.  Non-approved at this time.  Per current UpToDate guidelines, if chest xray is normal and low suspicion for infectious etiology to hemoptysis, recommend proceeding with high resolution CT chest. If CT chest negative, then bronchoscopy is recommended.  Scheduled for peer to peer review at 1:15pm today per [facility].

## 2016-06-17 ENCOUNTER — Telehealth: Payer: Self-pay

## 2016-06-17 NOTE — Telephone Encounter (Signed)
Pt's wife called to get ins auth for upcoming surgery. I spoke with her and let her know to call back with the date of surgery and the diagnosis code from the referring doctor. Once we have these, I will submit it to insurance to get authorization.

## 2016-06-19 ENCOUNTER — Other Ambulatory Visit: Payer: 59

## 2016-06-24 ENCOUNTER — Ambulatory Visit: Payer: Self-pay | Admitting: General Surgery

## 2016-06-24 NOTE — H&P (Signed)
eorge L. Toole 05/08/2016 2:29 PM Location: [facility] Patient #: [id number] DOB: 1981-05-06 Married / Language: English / Race: White Male   History of Present Illness [name] MD; 05/08/2016 3:08 PM) The patient is a 36 year old male.  Note:He is referred by Dr. Jeananne Rama for consultation regarding Crohn's disease and a stricture of the distal ileum. This stricture is intermittently symptomatic with respect to abdominal pain. He's had Crohn's disease for a long time and then 2002 underwent an exploratory laparotomy with resection of the terminal ileum and proximal right colon for complicated Crohn's disease. A year after that, he underwent exploratory laparotomy with lysis of adhesions. He had been doing fairly well since that time but is not developed some chronic intermittent abdominal pain and has been discovered to have a stricture proximal to his anastomosis between the ileum and right colon noted on Colonoscopy and recent SBFT. He is taking Humira which intermittently helps with the symptoms. Dr. Benson Norway has advised him to consider elective intestinal resection and thus he presents to discuss that. He says some days are better than others. He is married with 2 children. He works as a Dealer.  Other Problems [name], [place]; 05/08/2016 2:29 PM) Crohn's Disease  Gastroesophageal Reflux Disease  High blood pressure  Other disease, cancer, significant illness   Past Surgical History [name], [place]; 05/08/2016 2:29 PM) Appendectomy  Colon Removal - Partial  Gallbladder Surgery - Laparoscopic  Resection of Small Bowel   Diagnostic Studies History [name], [place]; 05/08/2016 2:29 PM) Colonoscopy  1-5 years ago  Allergies [name], CMA; 05/08/2016 2:33 PM) PredniSONE (Pak) *CORTICOSTEROIDS*  Anaphylaxis. Vicodin *ANALGESICS - OPIOID*  Hives, Itching. Morphine Sulfate (Concentrate) *ANALGESICS - OPIOID*   Hives.  Medication History [name], [place]; 05/08/2016 2:37 PM) Humira (20MG/0.4ML Kit, Subcutaneous daily) Active. CeleBREX (200MG Capsule, Oral daily) Active. Bentyl (20MG Tablet, Oral daily) Active. Zofran (8MG Tablet, Oral as needed) Active. Oxycodone-Acetaminophen (5-325MG Tablet, Oral as needed) Active. Protonix (40MG Tablet DR, Oral daily) Active. Mirapex (0.125MG Tablet, Oral daily) Active. Zantac (150MG Tablet, Oral daily) Active. Chantix Starting Month Pak (0.5 MG X 11 &1 MG X 42 Tablet, Oral daily) Active. Medications Reconciled  Social History [name], [place]; 05/08/2016 2:29 PM) Caffeine use  Carbonated beverages, Tea. Illicit drug use  Remotely quit drug use. No alcohol use  Tobacco use  Current every day smoker.  Family History [name], [place]; 05/08/2016 2:29 PM) Arthritis  Father. Colon Polyps  Father. Depression  Brother. Diabetes Mellitus  Father, Mother. Heart Disease  Father. Hypertension  Brother, Father, Mother. Respiratory Condition  Father.    Review of Systems [name] CMA; 05/08/2016 2:29 PM) General Not Present- Appetite Loss, Chills, Fatigue, Fever, Night Sweats, Weight Gain and Weight Loss. Skin Not Present- Change in Wart/Mole, Dryness, Hives, Jaundice, New Lesions, Non-Healing Wounds, Rash and Ulcer. Breast Not Present- Breast Mass, Breast Pain, Nipple Discharge and Skin Changes. Gastrointestinal Present- Abdominal Pain, Bloating, Chronic diarrhea, Constipation and Nausea. Not Present- Bloody Stool, Change in Bowel Habits, Difficulty Swallowing, Excessive gas, Gets full quickly at meals, Hemorrhoids, Indigestion, Rectal Pain and Vomiting. Musculoskeletal Present- Joint Pain. Not Present- Back Pain, Joint Stiffness, Muscle Pain, Muscle Weakness and Swelling of Extremities. Neurological Not Present- Decreased Memory, Fainting, Headaches, Numbness, Seizures, Tingling, Tremor, Trouble walking and  Weakness. Psychiatric Not Present- Anxiety, Bipolar, Change in Sleep Pattern, Depression, Fearful and Frequent crying. Endocrine Not Present- Cold Intolerance, Excessive Hunger, Hair Changes, Heat Intolerance, Hot flashes and New  Diabetes. Hematology Not Present- Blood Thinners, Easy Bruising, Excessive bleeding, Gland problems, HIV and Persistent Infections.    Physical Exam [name] MD; 05/08/2016 3:08 PM) The physical exam findings are as follows: Note:General: WDWN in NAD. Pleasant and cooperative.  HEENT: [place]/AT, no external nasal or ear masses, mucous membranes are moist  EYES: EOMI, no scleral icterus, pupils normal  NECK: Supple, no trachea deviation  CV: RRR, no murmur, no edema  CHEST: Breath sounds equal and clear. Respirations nonlabored.  ABDOMEN: Soft, nontender, nondistended, no masses, no organomegaly, small upper abdominal scars, midline scar that begins just above the umbilicus and extends down to the lower midline, no hernias  MUSCULOSKELETAL: FROM, good muscle tone, no edema, no venous stasis changes, normal station and gait  SKIN: No jaundice.  NEUROLOGIC: Alert and oriented, answers questions appropriately, normal gait and station.  PSYCHIATRIC: Normal mood, affect , and behavior.    Assessment & Plan [name] MD; 05/08/2016 3:10 PM) CROHN'S DISEASE OF ILEUM WITH COMPLICATION (P10.258) Impression: He has a fairly tight stricture proximal to his previous anastomosis and is intermittently symptomatic from that despite Humira.  Plan: We discussed laparoscopic assisted possible open resection of distal ileum and part of right colon with primary anastomosis. I have explained the procedure and risks of colon resection. Risks include but are not limited to bleeding, infection, wound problems, anesthesia, anastomotic leak, need for reoperative surgery, injury to intraabominal organs (such as intestine, spleen, kidney, bladder, ureter, etc.),  ileus, irregular bowel habits. He seems to understand all this.  [name], M.D.

## 2016-06-27 ENCOUNTER — Telehealth: Payer: Self-pay

## 2016-06-27 NOTE — Telephone Encounter (Signed)
Pt's wife called and left VM with details for upcoming surgery. I contacted [employer] and the rep told me that no auth was needed (reference number [id number], representative Janelle). I will call again next week to ensure that this is correct as this plan states that referrals are needed for all visits.   Surgery is scheduled 07/17/16 at [facility] with Dr Zella Richer ([place] [phone] on [address]).  Diagnosis K50.019 CPT: 91225, [id number]

## 2016-06-27 NOTE — Telephone Encounter (Signed)
Left VM with patient.  I called [facility] and spoke with Estill Bamberg, who handles all surgery preauths with insurance. Clarified that they are responsible for the preauth of this upcoming surgery and that she will get everything taken care of. Also stated that no balance is owed from the patients past visit even without presence of a referral. She is unsure as to why our office was not contacted and authorization attained for the original referral, but everything has been written off/taken care of.

## 2016-06-30 ENCOUNTER — Telehealth: Payer: Self-pay

## 2016-06-30 NOTE — Telephone Encounter (Signed)
Pt's wife called stating that the pt needs more clarification on how to take Chantix (new rx by Dr Tamala Julian). The bottle says one thing but he remembers her giving him different instructions.   Would also like to know if Dr Tamala Julian wanted to see him before his upcoming surgery, scheduled for 1/25.

## 2016-07-01 ENCOUNTER — Telehealth: Payer: Self-pay

## 2016-07-01 NOTE — Telephone Encounter (Signed)
Pt advised specific instructions as written by md. Had preop exam/clearence with surgeon already and advised no need to see Dr Tamala Julian prior, if Dr Tamala Julian needs to see him, we will call him back.

## 2016-07-01 NOTE — Telephone Encounter (Signed)
Confirmed that Referral for upcoming visit to Dr Zella Richer has been filed. [employer] rep confirmed. Referral number [id number].

## 2016-07-03 NOTE — Telephone Encounter (Signed)
Call ---- which Chantix is patient starting?  He should be starting the Chantix starter pack and should take it as instructed on starter pack.  No need to see me prior to surgery.  Also insurance approved CT chest; has pt scheduled this yet?

## 2016-07-04 NOTE — Telephone Encounter (Signed)
Pt given Chantix started pack instructions. Verbalized understanding  Informed, he does not need to see Dr. Tamala Julian prior to surgery Needed to reschedule CT due to scheduling conflict. Advised to reschedule

## 2016-07-10 NOTE — Progress Notes (Signed)
EKG 06-05-16 EPIC  CHEST XRAY 06-05-16 EPIC

## 2016-07-10 NOTE — Patient Instructions (Addendum)
Leroy Herrera  07/10/2016   Your procedure is scheduled on: 07-17-16  Report to [facility] Main  Entrance take [facility]  elevators to 3rd floor to  [place] at 630 AM.  Call this number if you have problems the morning of surgery [phone]   Remember: ONLY 1 PERSON MAY GO WITH YOU TO SHORT STAY TO GET  READY MORNING OF [place].  Do not eat food or drink liquids :After Midnight.     Take these medicines the morning of surgery with A SIP OF WATER: DEXILANT                               You may not have any metal on your body including hair pins and              piercings  Do not wear jewelry, make-up, lotions, powders or perfumes, deodorant             Do not wear nail polish.  Do not shave  48 hours prior to surgery.              Men may shave face and neck.   Do not bring valuables to the hospital. [place].  Contacts, dentures or bridgework may not be worn into surgery.  Leave suitcase in the car. After surgery it may be brought to your room.                 Please read over the following fact sheets you were given: _____________________________________________________________________             [facility] - Preparing for Surgery Before surgery, you can play an important role.  Because skin is not sterile, your skin needs to be as free of germs as possible.  You can reduce the number of germs on your skin by washing with CHG (chlorahexidine gluconate) soap before surgery.  CHG is an antiseptic cleaner which kills germs and bonds with the skin to continue killing germs even after washing. Please DO NOT use if you have an allergy to CHG or antibacterial soaps.  If your skin becomes reddened/irritated stop using the CHG and inform your nurse when you arrive at Short Stay. Do not shave (including legs and underarms) for at least 48 hours prior to the first CHG shower.  You may shave your  face/neck. Please follow these instructions carefully:  1.  Shower with CHG Soap the night before surgery and the  morning of Surgery.  2.  If you choose to wash your hair, wash your hair first as usual with your  normal  shampoo.  3.  After you shampoo, rinse your hair and body thoroughly to remove the  shampoo.                           4.  Use CHG as you would any other liquid soap.  You can apply chg directly  to the skin and wash                       Gently with a scrungie or clean washcloth.  5.  Apply the CHG Soap to your  body ONLY FROM THE NECK DOWN.   Do not use on face/ open                           Wound or open sores. Avoid contact with eyes, ears mouth and genitals (private parts).                       Wash face,  Genitals (private parts) with your normal soap.             6.  Wash thoroughly, paying special attention to the area where your surgery  will be performed.  7.  Thoroughly rinse your body with warm water from the neck down.  8.  DO NOT shower/wash with your normal soap after using and rinsing off  the CHG Soap.                9.  Pat yourself dry with a clean towel.            10.  Wear clean pajamas.            11.  Place clean sheets on your bed the night of your first shower and do not  sleep with pets. Day of Surgery : Do not apply any lotions/deodorants the morning of surgery.  Please wear clean clothes to the hospital/surgery center.  FAILURE TO FOLLOW THESE INSTRUCTIONS MAY RESULT IN THE CANCELLATION OF YOUR SURGERY PATIENT SIGNATURE_________________________________  NURSE SIGNATURE__________________________________  ________________________________________________________________________

## 2016-07-14 ENCOUNTER — Encounter (HOSPITAL_COMMUNITY)
Admission: RE | Admit: 2016-07-14 | Discharge: 2016-07-14 | Disposition: A | Discharge status: Home / Self Care | Payer: 59 | Source: Ambulatory Visit | Attending: General Surgery | Admitting: General Surgery

## 2016-07-14 ENCOUNTER — Encounter (HOSPITAL_COMMUNITY): Payer: Self-pay

## 2016-07-14 DIAGNOSIS — K50012 Crohn's disease of small intestine with intestinal obstruction: Secondary | ICD-10-CM | POA: Insufficient documentation

## 2016-07-14 DIAGNOSIS — Z01812 Encounter for preprocedural laboratory examination: Secondary | ICD-10-CM

## 2016-07-14 DIAGNOSIS — Z0183 Encounter for blood typing: Secondary | ICD-10-CM

## 2016-07-14 HISTORY — DX: Gastro-esophageal reflux disease without esophagitis: K21.9

## 2016-07-14 LAB — CBC WITH DIFFERENTIAL/PLATELET
BASOS PCT: 0 %
Basophils Absolute: 0 10*3/uL (ref 0.0–0.1)
EOS PCT: 1 %
Eosinophils Absolute: 0.1 10*3/uL (ref 0.0–0.7)
HCT: 40.9 % (ref 39.0–52.0)
Hemoglobin: 14.1 g/dL (ref 13.0–17.0)
Lymphocytes Relative: 25 %
Lymphs Abs: 2.4 10*3/uL (ref 0.7–4.0)
MCH: 29.1 pg (ref 26.0–34.0)
MCHC: 34.5 g/dL (ref 30.0–36.0)
MCV: 84.5 fL (ref 78.0–100.0)
MONO ABS: 0.6 10*3/uL (ref 0.1–1.0)
Monocytes Relative: 7 %
Neutro Abs: 6.4 10*3/uL (ref 1.7–7.7)
Neutrophils Relative %: 67 %
PLATELETS: 206 10*3/uL (ref 150–400)
RBC: 4.84 MIL/uL (ref 4.22–5.81)
RDW: 12.5 % (ref 11.5–15.5)
WBC: 9.6 10*3/uL (ref 4.0–10.5)

## 2016-07-14 LAB — COMPREHENSIVE METABOLIC PANEL
ALT: 79 U/L — ABNORMAL HIGH (ref 17–63)
ANION GAP: 6 (ref 5–15)
AST: 48 U/L — ABNORMAL HIGH (ref 15–41)
Albumin: 4.3 g/dL (ref 3.5–5.0)
Alkaline Phosphatase: 111 U/L (ref 38–126)
BUN: 15 mg/dL (ref 6–20)
CHLORIDE: 104 mmol/L (ref 101–111)
CO2: 26 mmol/L (ref 22–32)
Calcium: 9.4 mg/dL (ref 8.9–10.3)
Creatinine, Ser: 1.04 mg/dL (ref 0.61–1.24)
GFR calc Af Amer: >60 mL/min (ref >60)
Glucose, Bld: 87 mg/dL (ref 65–99)
POTASSIUM: 4.7 mmol/L (ref 3.5–5.1)
Sodium: 136 mmol/L (ref 135–145)
Total Bilirubin: 0.3 mg/dL (ref 0.3–1.2)
Total Protein: 7 g/dL (ref 6.5–8.1)

## 2016-07-16 NOTE — Anesthesia Preprocedure Evaluation (Addendum)
Anesthesia Evaluation  Patient identified by MRN, date of birth, ID band Patient awake    Reviewed: Allergy & Precautions, NPO status , Patient's Chart, lab work & pertinent test results  Airway Mallampati: II  TM Distance: >3 FB Neck ROM: Full    Dental no notable dental hx.    Pulmonary neg pulmonary ROS, Current Smoker,    Pulmonary exam normal breath sounds clear to auscultation       Cardiovascular hypertension, negative cardio ROS Normal cardiovascular exam Rhythm:Regular Rate:Normal     Neuro/Psych negative neurological ROS  negative psych ROS   GI/Hepatic negative GI ROS, Neg liver ROS,   Endo/Other  negative endocrine ROS  Renal/GU negative Renal ROS  negative genitourinary   Musculoskeletal negative musculoskeletal ROS (+)   Abdominal   Peds negative pediatric ROS (+)  Hematology negative hematology ROS (+)   Anesthesia Other Findings   Reproductive/Obstetrics negative OB ROS                             Anesthesia Physical Anesthesia Plan  ASA: II  Anesthesia Plan: General   Post-op Pain Management:    Induction: Intravenous  Airway Management Planned: Oral ETT  Additional Equipment:   Intra-op Plan:   Post-operative Plan: Extubation in OR  Informed Consent: I have reviewed the patients History and Physical, chart, labs and discussed the procedure including the risks, benefits and alternatives for the proposed anesthesia with the patient or authorized representative who has indicated his/her understanding and acceptance.   Dental advisory given  Plan Discussed with: CRNA  Anesthesia Plan Comments:         Anesthesia Quick Evaluation

## 2016-07-17 ENCOUNTER — Encounter (HOSPITAL_COMMUNITY)
Admission: RE | Disposition: A | Discharge status: Home / Self Care | Payer: Self-pay | Source: Ambulatory Visit | Attending: General Surgery

## 2016-07-17 ENCOUNTER — Inpatient Hospital Stay (HOSPITAL_COMMUNITY): Payer: 59 | Admitting: Anesthesiology

## 2016-07-17 ENCOUNTER — Encounter (HOSPITAL_COMMUNITY): Payer: Self-pay | Admitting: *Deleted

## 2016-07-17 ENCOUNTER — Inpatient Hospital Stay (HOSPITAL_COMMUNITY)
Admission: RE | Admit: 2016-07-17 | Discharge: 2016-07-21 | DRG: 331 | Disposition: A | Discharge status: Home / Self Care | Payer: 59 | Source: Ambulatory Visit | Attending: General Surgery | Admitting: General Surgery

## 2016-07-17 DIAGNOSIS — K50019 Crohn's disease of small intestine with unspecified complications: Secondary | ICD-10-CM | POA: Diagnosis not present

## 2016-07-17 DIAGNOSIS — K219 Gastro-esophageal reflux disease without esophagitis: Secondary | ICD-10-CM | POA: Diagnosis present

## 2016-07-17 DIAGNOSIS — Z79891 Long term (current) use of opiate analgesic: Secondary | ICD-10-CM

## 2016-07-17 DIAGNOSIS — Z9049 Acquired absence of other specified parts of digestive tract: Secondary | ICD-10-CM | POA: Diagnosis not present

## 2016-07-17 DIAGNOSIS — I10 Essential (primary) hypertension: Secondary | ICD-10-CM | POA: Diagnosis present

## 2016-07-17 DIAGNOSIS — Z8249 Family history of ischemic heart disease and other diseases of the circulatory system: Secondary | ICD-10-CM

## 2016-07-17 DIAGNOSIS — Z79899 Other long term (current) drug therapy: Secondary | ICD-10-CM | POA: Diagnosis not present

## 2016-07-17 DIAGNOSIS — Z87891 Personal history of nicotine dependence: Secondary | ICD-10-CM | POA: Diagnosis not present

## 2016-07-17 DIAGNOSIS — Z885 Allergy status to narcotic agent status: Secondary | ICD-10-CM

## 2016-07-17 DIAGNOSIS — K50018 Crohn's disease of small intestine with other complication: Principal | ICD-10-CM | POA: Diagnosis present

## 2016-07-17 DIAGNOSIS — Z888 Allergy status to other drugs, medicaments and biological substances status: Secondary | ICD-10-CM | POA: Diagnosis not present

## 2016-07-17 DIAGNOSIS — Z791 Long term (current) use of non-steroidal anti-inflammatories (NSAID): Secondary | ICD-10-CM | POA: Diagnosis not present

## 2016-07-17 DIAGNOSIS — K5000 Crohn's disease of small intestine without complications: Secondary | ICD-10-CM | POA: Insufficient documentation

## 2016-07-17 DIAGNOSIS — K5090 Crohn's disease, unspecified, without complications: Secondary | ICD-10-CM | POA: Diagnosis present

## 2016-07-17 DIAGNOSIS — Z8371 Family history of colonic polyps: Secondary | ICD-10-CM

## 2016-07-17 HISTORY — PX: COLON RESECTION: SHX5231

## 2016-07-17 LAB — TYPE AND SCREEN
ABO/RH(D): O POS
ANTIBODY SCREEN: NEGATIVE

## 2016-07-17 SURGERY — LAPAROSCOPIC RIGHT COLON RESECTION
Anesthesia: General | Site: Abdomen | Laterality: Right

## 2016-07-17 MED ORDER — ONDANSETRON HCL 4 MG/2ML IJ SOLN: 4.0000 mg | Freq: Four times a day (QID) | INTRAMUSCULAR | Status: DC | PRN

## 2016-07-17 MED ORDER — HYDROMORPHONE HCL 1 MG/ML IJ SOLN
INTRAMUSCULAR | Status: AC
  Administered 2016-07-17: 0.5 mg via INTRAVENOUS
  Filled 2016-07-17: qty 1

## 2016-07-17 MED ORDER — CHLORHEXIDINE GLUCONATE CLOTH 2 % EX PADS: 6.0000 | MEDICATED_PAD | Freq: Once | CUTANEOUS | Status: DC

## 2016-07-17 MED ORDER — ROCURONIUM BROMIDE 10 MG/ML (PF) SYRINGE
PREFILLED_SYRINGE | INTRAVENOUS | Status: DC | PRN
  Administered 2016-07-17: 10 mg via INTRAVENOUS
  Administered 2016-07-17 (×2): 20 mg via INTRAVENOUS
  Administered 2016-07-17: 50 mg via INTRAVENOUS
  Administered 2016-07-17: 10 mg via INTRAVENOUS
  Administered 2016-07-17: 5 mg via INTRAVENOUS
  Administered 2016-07-17: 30 mg via INTRAVENOUS

## 2016-07-17 MED ORDER — MEPERIDINE HCL 50 MG/ML IJ SOLN: 6.2500 mg | INTRAMUSCULAR | Status: DC | PRN

## 2016-07-17 MED ORDER — NALOXONE HCL 0.4 MG/ML IJ SOLN: 0.4000 mg | INTRAMUSCULAR | Status: DC | PRN

## 2016-07-17 MED ORDER — LACTATED RINGERS IV SOLN
INTRAVENOUS | Status: DC | PRN
  Administered 2016-07-17 (×2): via INTRAVENOUS

## 2016-07-17 MED ORDER — CEFOTETAN DISODIUM-DEXTROSE 2-2.08 GM-% IV SOLR
2.0000 g | INTRAVENOUS | Status: AC
  Administered 2016-07-17: 2 g via INTRAVENOUS

## 2016-07-17 MED ORDER — BUPIVACAINE HCL (PF) 0.5 % IJ SOLN
INTRAMUSCULAR | Status: AC
  Filled 2016-07-17: qty 60

## 2016-07-17 MED ORDER — HYDROMORPHONE 1 MG/ML IV SOLN
INTRAVENOUS | Status: DC
  Administered 2016-07-17: 3.6 mg via INTRAVENOUS
  Administered 2016-07-17: 4.5 mg via INTRAVENOUS
  Administered 2016-07-17: 12:00:00 via INTRAVENOUS
  Administered 2016-07-18: 2.1 mg via INTRAVENOUS
  Administered 2016-07-18: 1.8 mg via INTRAVENOUS
  Administered 2016-07-18 (×2): 3.6 mg via INTRAVENOUS
  Administered 2016-07-18: 4.5 mg via INTRAVENOUS
  Administered 2016-07-18: 3 mg via INTRAVENOUS
  Administered 2016-07-19: 2.6 mg via INTRAVENOUS
  Administered 2016-07-19: 2.1 mg via INTRAVENOUS
  Administered 2016-07-19: 1.8 mg via INTRAVENOUS
  Administered 2016-07-19: 2.4 mg via INTRAVENOUS
  Administered 2016-07-19: 2.7 mg via INTRAVENOUS
  Administered 2016-07-20: 1.2 mg via INTRAVENOUS
  Administered 2016-07-20: 0.6 mg via INTRAVENOUS
  Filled 2016-07-17 (×3): qty 25

## 2016-07-17 MED ORDER — PHENYLEPHRINE 40 MCG/ML (10ML) SYRINGE FOR IV PUSH (FOR BLOOD PRESSURE SUPPORT)
PREFILLED_SYRINGE | INTRAVENOUS | Status: DC | PRN
  Administered 2016-07-17: 40 ug via INTRAVENOUS
  Administered 2016-07-17: 80 ug via INTRAVENOUS

## 2016-07-17 MED ORDER — FLUTICASONE PROPIONATE 50 MCG/ACT NA SUSP: 2.0000 | Freq: Every day | NASAL | Status: DC | PRN

## 2016-07-17 MED ORDER — LACTATED RINGERS IR SOLN
Status: DC | PRN
  Administered 2016-07-17: 1000 mL

## 2016-07-17 MED ORDER — DIPHENHYDRAMINE HCL (SLEEP) 50 MG/30ML PO LIQD: 1.0000 | Freq: Every evening | ORAL | Status: DC | PRN

## 2016-07-17 MED ORDER — ROCURONIUM BROMIDE 50 MG/5ML IV SOSY
PREFILLED_SYRINGE | INTRAVENOUS | Status: AC
  Filled 2016-07-17: qty 5

## 2016-07-17 MED ORDER — METHOCARBAMOL 1000 MG/10ML IJ SOLN
500.0000 mg | Freq: Three times a day (TID) | INTRAVENOUS | Status: DC
  Administered 2016-07-17 – 2016-07-21 (×12): 500 mg via INTRAVENOUS
  Filled 2016-07-17 (×2): qty 550
  Filled 2016-07-17: qty 5
  Filled 2016-07-17 (×8): qty 550
  Filled 2016-07-17: qty 5
  Filled 2016-07-17: qty 550

## 2016-07-17 MED ORDER — PHENYLEPHRINE 40 MCG/ML (10ML) SYRINGE FOR IV PUSH (FOR BLOOD PRESSURE SUPPORT)
PREFILLED_SYRINGE | INTRAVENOUS | Status: AC
  Filled 2016-07-17: qty 10

## 2016-07-17 MED ORDER — LISINOPRIL-HYDROCHLOROTHIAZIDE 20-25 MG PO TABS: 1.0000 | ORAL_TABLET | Freq: Every day | ORAL | Status: DC

## 2016-07-17 MED ORDER — KETAMINE HCL 10 MG/ML IJ SOLN
INTRAMUSCULAR | Status: DC | PRN
  Administered 2016-07-17: 10 mg via INTRAVENOUS
  Administered 2016-07-17: 20 mg via INTRAVENOUS
  Administered 2016-07-17: 10 mg via INTRAVENOUS

## 2016-07-17 MED ORDER — HYDROMORPHONE HCL 1 MG/ML IJ SOLN
INTRAMUSCULAR | Status: AC
  Filled 2016-07-17: qty 1

## 2016-07-17 MED ORDER — ENOXAPARIN SODIUM 40 MG/0.4ML ~~LOC~~ SOLN
40.0000 mg | SUBCUTANEOUS | Status: DC
  Filled 2016-07-17 (×2): qty 0.4

## 2016-07-17 MED ORDER — DIPHENHYDRAMINE HCL 12.5 MG/5ML PO ELIX
12.5000 mg | ORAL_SOLUTION | Freq: Four times a day (QID) | ORAL | Status: DC | PRN
  Administered 2016-07-18: 12.5 mg via ORAL
  Filled 2016-07-17 (×2): qty 5

## 2016-07-17 MED ORDER — DEXTROSE 5 % IV SOLN
2.0000 g | Freq: Two times a day (BID) | INTRAVENOUS | Status: AC
  Administered 2016-07-17: 2 g via INTRAVENOUS
  Filled 2016-07-17: qty 2

## 2016-07-17 MED ORDER — PROMETHAZINE HCL 25 MG/ML IJ SOLN: 6.2500 mg | INTRAMUSCULAR | Status: DC | PRN

## 2016-07-17 MED ORDER — HYDROMORPHONE HCL 1 MG/ML IJ SOLN
0.2500 mg | INTRAMUSCULAR | Status: DC | PRN
  Administered 2016-07-17 (×4): 0.5 mg via INTRAVENOUS

## 2016-07-17 MED ORDER — PROPOFOL 10 MG/ML IV BOLUS
INTRAVENOUS | Status: AC
  Filled 2016-07-17: qty 20

## 2016-07-17 MED ORDER — KCL IN DEXTROSE-NACL 20-5-0.9 MEQ/L-%-% IV SOLN
INTRAVENOUS | Status: DC
Start: 2016-07-17 — End: 2016-07-20
  Administered 2016-07-17: 16:00:00 via INTRAVENOUS
  Administered 2016-07-18: 125 mL/h via INTRAVENOUS
  Administered 2016-07-18 – 2016-07-19 (×3): via INTRAVENOUS
  Administered 2016-07-19: 100 mL/h via INTRAVENOUS
  Filled 2016-07-17 (×8): qty 1000

## 2016-07-17 MED ORDER — CEFOTETAN DISODIUM-DEXTROSE 2-2.08 GM-% IV SOLR
INTRAVENOUS | Status: AC
  Filled 2016-07-17: qty 50

## 2016-07-17 MED ORDER — ONDANSETRON HCL 4 MG/2ML IJ SOLN
INTRAMUSCULAR | Status: AC
  Filled 2016-07-17: qty 2

## 2016-07-17 MED ORDER — SUGAMMADEX SODIUM 200 MG/2ML IV SOLN
INTRAVENOUS | Status: DC | PRN
  Administered 2016-07-17: 200 mg via INTRAVENOUS

## 2016-07-17 MED ORDER — LISINOPRIL 20 MG PO TABS
20.0000 mg | ORAL_TABLET | Freq: Every day | ORAL | Status: DC
  Administered 2016-07-18 – 2016-07-21 (×4): 20 mg via ORAL
  Filled 2016-07-17 (×4): qty 1

## 2016-07-17 MED ORDER — FENTANYL CITRATE (PF) 100 MCG/2ML IJ SOLN
INTRAMUSCULAR | Status: AC
  Filled 2016-07-17: qty 4

## 2016-07-17 MED ORDER — PROPOFOL 10 MG/ML IV BOLUS
INTRAVENOUS | Status: DC | PRN
  Administered 2016-07-17: 200 mg via INTRAVENOUS

## 2016-07-17 MED ORDER — FENTANYL CITRATE (PF) 100 MCG/2ML IJ SOLN
INTRAMUSCULAR | Status: DC | PRN
  Administered 2016-07-17: 50 ug via INTRAVENOUS
  Administered 2016-07-17: 100 ug via INTRAVENOUS
  Administered 2016-07-17 (×3): 50 ug via INTRAVENOUS

## 2016-07-17 MED ORDER — ONDANSETRON HCL 4 MG/2ML IJ SOLN: 4.0000 mg | INTRAMUSCULAR | Status: DC | PRN

## 2016-07-17 MED ORDER — DIPHENHYDRAMINE HCL 50 MG/ML IJ SOLN
12.5000 mg | Freq: Four times a day (QID) | INTRAMUSCULAR | Status: DC | PRN
  Administered 2016-07-18: 12.5 mg via INTRAVENOUS
  Filled 2016-07-17: qty 1

## 2016-07-17 MED ORDER — SUGAMMADEX SODIUM 200 MG/2ML IV SOLN
INTRAVENOUS | Status: AC
  Filled 2016-07-17: qty 2

## 2016-07-17 MED ORDER — EPHEDRINE SULFATE-NACL 50-0.9 MG/10ML-% IV SOSY
PREFILLED_SYRINGE | INTRAVENOUS | Status: DC | PRN
  Administered 2016-07-17 (×2): 5 mg via INTRAVENOUS

## 2016-07-17 MED ORDER — LACTATED RINGERS IV SOLN: INTRAVENOUS | Status: DC

## 2016-07-17 MED ORDER — BUPIVACAINE HCL (PF) 0.5 % IJ SOLN
INTRAMUSCULAR | Status: DC | PRN
  Administered 2016-07-17: 5 mL

## 2016-07-17 MED ORDER — ALVIMOPAN 12 MG PO CAPS: 12.0000 mg | ORAL_CAPSULE | Freq: Two times a day (BID) | ORAL | Status: DC

## 2016-07-17 MED ORDER — MIDAZOLAM HCL 2 MG/2ML IJ SOLN
INTRAMUSCULAR | Status: AC
  Filled 2016-07-17: qty 2

## 2016-07-17 MED ORDER — LIDOCAINE 2% (20 MG/ML) 5 ML SYRINGE
INTRAMUSCULAR | Status: AC
  Filled 2016-07-17: qty 5

## 2016-07-17 MED ORDER — ONDANSETRON HCL 4 MG PO TABS: 4.0000 mg | ORAL_TABLET | Freq: Four times a day (QID) | ORAL | Status: DC | PRN

## 2016-07-17 MED ORDER — EPHEDRINE 5 MG/ML INJ
INTRAVENOUS | Status: AC
  Filled 2016-07-17: qty 10

## 2016-07-17 MED ORDER — PANTOPRAZOLE SODIUM 40 MG IV SOLR
40.0000 mg | INTRAVENOUS | Status: DC
  Administered 2016-07-17: 40 mg via INTRAVENOUS

## 2016-07-17 MED ORDER — SODIUM CHLORIDE 0.9% FLUSH: 9.0000 mL | INTRAVENOUS | Status: DC | PRN

## 2016-07-17 MED ORDER — FENTANYL CITRATE (PF) 100 MCG/2ML IJ SOLN
INTRAMUSCULAR | Status: AC
  Filled 2016-07-17: qty 2

## 2016-07-17 MED ORDER — ONDANSETRON HCL 4 MG/2ML IJ SOLN
INTRAMUSCULAR | Status: DC | PRN
  Administered 2016-07-17: 4 mg via INTRAVENOUS

## 2016-07-17 MED ORDER — HYDROCHLOROTHIAZIDE 25 MG PO TABS
25.0000 mg | ORAL_TABLET | Freq: Every day | ORAL | Status: DC
  Administered 2016-07-18 – 2016-07-21 (×4): 25 mg via ORAL
  Filled 2016-07-17 (×4): qty 1

## 2016-07-17 MED ORDER — KETAMINE HCL 10 MG/ML IJ SOLN
INTRAMUSCULAR | Status: AC
  Filled 2016-07-17: qty 1

## 2016-07-17 MED ORDER — PANTOPRAZOLE SODIUM 40 MG IV SOLR
40.0000 mg | Freq: Two times a day (BID) | INTRAVENOUS | Status: DC
  Administered 2016-07-18 – 2016-07-20 (×5): 40 mg via INTRAVENOUS
  Filled 2016-07-17 (×6): qty 40

## 2016-07-17 MED ORDER — LIDOCAINE HCL (CARDIAC) 20 MG/ML IV SOLN
INTRAVENOUS | Status: DC | PRN
  Administered 2016-07-17: 100 mg via INTRAVENOUS

## 2016-07-17 MED ORDER — MIDAZOLAM HCL 5 MG/5ML IJ SOLN
INTRAMUSCULAR | Status: DC | PRN
Start: 2016-07-17 — End: 2016-07-17
  Administered 2016-07-17: 2 mg via INTRAVENOUS

## 2016-07-17 MED ORDER — 0.9 % SODIUM CHLORIDE (POUR BTL) OPTIME
TOPICAL | Status: DC | PRN
  Administered 2016-07-17: 4000 mL

## 2016-07-17 SURGICAL SUPPLY — 76 items
APPLIER CLIP 5 13 M/L LIGAMAX5 (MISCELLANEOUS)
APPLIER CLIP ROT 10 11.4 M/L (STAPLE)
BENZOIN TINCTURE PRP APPL 2/3 (GAUZE/BANDAGES/DRESSINGS) ×2 IMPLANT
BLADE EXTENDED COATED 6.5IN (ELECTRODE) ×2 IMPLANT
BLADE HEX COATED 2.75 (ELECTRODE) ×2 IMPLANT
CABLE HIGH FREQUENCY MONO STRZ (ELECTRODE) ×2 IMPLANT
CELLS DAT CNTRL 66122 CELL SVR (MISCELLANEOUS) ×1 IMPLANT
CLIP APPLIE 5 13 M/L LIGAMAX5 (MISCELLANEOUS) IMPLANT
CLIP APPLIE ROT 10 11.4 M/L (STAPLE) IMPLANT
COUNTER NEEDLE 20 DBL MAG RED (NEEDLE) ×2 IMPLANT
COVER MAYO STAND STRL (DRAPES) ×6 IMPLANT
COVER SURGICAL LIGHT HANDLE (MISCELLANEOUS) ×4 IMPLANT
DECANTER SPIKE VIAL GLASS SM (MISCELLANEOUS) ×2 IMPLANT
DISSECTOR BLUNT TIP ENDO 5MM (MISCELLANEOUS) IMPLANT
DRAIN CHANNEL 19F RND (DRAIN) IMPLANT
DRAPE LAPAROSCOPIC ABDOMINAL (DRAPES) ×2 IMPLANT
DRAPE SURG IRRIG POUCH 19X23 (DRAPES) ×2 IMPLANT
DRSG OPSITE POSTOP 4X8 (GAUZE/BANDAGES/DRESSINGS) ×2 IMPLANT
ELECT REM PT RETURN 15FT ADLT (MISCELLANEOUS) ×2 IMPLANT
EVACUATOR SILICONE 100CC (DRAIN) IMPLANT
FILTER SMOKE EVAC LAPAROSHD (FILTER) IMPLANT
GAUZE SPONGE 2X2 8PLY STRL LF (GAUZE/BANDAGES/DRESSINGS) ×1 IMPLANT
GAUZE SPONGE 4X4 12PLY STRL (GAUZE/BANDAGES/DRESSINGS) ×2 IMPLANT
GLOVE BIO SURGEON STRL SZ 6.5 (GLOVE) ×4 IMPLANT
GLOVE BIO SURGEON STRL SZ7.5 (GLOVE) ×6 IMPLANT
GLOVE BIOGEL PI IND STRL 7.0 (GLOVE) ×1 IMPLANT
GLOVE BIOGEL PI INDICATOR 7.0 (GLOVE) ×1
GLOVE ECLIPSE 8.0 STRL XLNG CF (GLOVE) ×4 IMPLANT
GLOVE INDICATOR 6.5 STRL GRN (GLOVE) ×2 IMPLANT
GLOVE INDICATOR 8.0 STRL GRN (GLOVE) ×4 IMPLANT
GOWN STRL REUS W/ TWL XL LVL3 (GOWN DISPOSABLE) ×1 IMPLANT
GOWN STRL REUS W/TWL LRG LVL3 (GOWN DISPOSABLE) ×4 IMPLANT
GOWN STRL REUS W/TWL XL LVL3 (GOWN DISPOSABLE) ×3 IMPLANT
HOLDER FOLEY CATH W/STRAP (MISCELLANEOUS) ×2 IMPLANT
IRRIG SUCT STRYKERFLOW 2 WTIP (MISCELLANEOUS) ×2
IRRIGATION SUCT STRKRFLW 2 WTP (MISCELLANEOUS) ×1 IMPLANT
LEGGING LITHOTOMY PAIR STRL (DRAPES) ×2 IMPLANT
LIGASURE IMPACT 36 18CM CVD LR (INSTRUMENTS) ×2 IMPLANT
PACK COLON (CUSTOM PROCEDURE TRAY) ×2 IMPLANT
PAD POSITIONING PINK XL (MISCELLANEOUS) ×2 IMPLANT
PORT LAP GEL ALEXIS MED 5-9CM (MISCELLANEOUS) IMPLANT
RELOAD PROXIMATE 75MM BLUE (ENDOMECHANICALS) ×4 IMPLANT
RTRCTR WOUND ALEXIS 18CM MED (MISCELLANEOUS) ×2
SCISSORS LAP 5X35 DISP (ENDOMECHANICALS) ×2 IMPLANT
SEALER TISSUE X1 CVD JAW (INSTRUMENTS) IMPLANT
SHEARS HARMONIC ACE PLUS 36CM (ENDOMECHANICALS) ×2 IMPLANT
SLEEVE XCEL OPT CAN 5 100 (ENDOMECHANICALS) ×6 IMPLANT
SPONGE GAUZE 2X2 STER 10/PKG (GAUZE/BANDAGES/DRESSINGS) ×1
SPONGE LAP 18X18 X RAY DECT (DISPOSABLE) ×2 IMPLANT
STAPLER GUN LINEAR PROX 60 (STAPLE) ×2 IMPLANT
STAPLER PROXIMATE 75MM BLUE (STAPLE) ×2 IMPLANT
STAPLER VISISTAT 35W (STAPLE) ×2 IMPLANT
STRIP CLOSURE SKIN 1/2X4 (GAUZE/BANDAGES/DRESSINGS) ×2 IMPLANT
SUT ETHILON 3 0 PS 1 (SUTURE) IMPLANT
SUT PDS AB 1 CTX 36 (SUTURE) ×4 IMPLANT
SUT PDS AB 1 TP1 96 (SUTURE) IMPLANT
SUT PROLENE 2 0 SH DA (SUTURE) ×2 IMPLANT
SUT SILK 2 0 (SUTURE) ×1
SUT SILK 2 0 SH CR/8 (SUTURE) ×2 IMPLANT
SUT SILK 2-0 18XBRD TIE 12 (SUTURE) ×1 IMPLANT
SUT SILK 3 0 (SUTURE) ×1
SUT SILK 3 0 SH CR/8 (SUTURE) ×8 IMPLANT
SUT SILK 3-0 18XBRD TIE 12 (SUTURE) ×1 IMPLANT
SUT VICRYL 2 0 18  UND BR (SUTURE)
SUT VICRYL 2 0 18 UND BR (SUTURE) IMPLANT
SYS LAPSCP GELPORT 120MM (MISCELLANEOUS) ×2
SYSTEM LAPSCP GELPORT 120MM (MISCELLANEOUS) ×1 IMPLANT
TAPE CLOTH SURG 4X10 WHT LF (GAUZE/BANDAGES/DRESSINGS) ×2 IMPLANT
TOWEL OR 17X26 10 PK STRL BLUE (TOWEL DISPOSABLE) ×2 IMPLANT
TOWEL OR NON WOVEN STRL DISP B (DISPOSABLE) ×2 IMPLANT
TRAY FOLEY W/METER SILVER 16FR (SET/KITS/TRAYS/PACK) ×2 IMPLANT
TROCAR BLADELESS OPT 5 100 (ENDOMECHANICALS) ×2 IMPLANT
TROCAR XCEL BLUNT TIP 100MML (ENDOMECHANICALS) IMPLANT
TROCAR XCEL NON-BLD 11X100MML (ENDOMECHANICALS) IMPLANT
TUBING INSUF HEATED (TUBING) ×2 IMPLANT
YANKAUER SUCT BULB TIP NO VENT (SUCTIONS) ×2 IMPLANT

## 2016-07-17 NOTE — Anesthesia Postprocedure Evaluation (Signed)
Anesthesia Post Note  Patient: Leroy Herrera  Procedure(s) Performed: Procedure(s) (LRB): LAPAROSCOPIC HAND ASSISTED TERMINAL ILEUM AND RIGHT COLON (Right)  Patient location during evaluation: PACU Anesthesia Type: General Level of consciousness: sedated and patient cooperative Pain management: pain level controlled Vital Signs Assessment: post-procedure vital signs reviewed and stable Respiratory status: spontaneous breathing Cardiovascular status: stable Anesthetic complications: no       Last Vitals:  Vitals:   07/17/16 1544 07/17/16 1800  BP:  125/87  Pulse:  87  Resp: 20 18  Temp:  36.9 C    Last Pain:  Vitals:   07/17/16 1800  TempSrc: Oral  PainSc:                  [name]

## 2016-07-17 NOTE — H&P (View-Only) (Signed)
Leroy Herrera 05/08/2016 2:29 PM Location: [facility] Patient #: [id number] DOB: Oct 13, 1980 Married / Language: English / Race: White Male   History of Present Illness Leroy Herrera; 05/08/2016 3:08 PM) The patient is a 36 year old male.  Note:He is referred by Dr. Jeananne Rama for consultation regarding Crohn's disease and a stricture of the distal ileum. This stricture is intermittently symptomatic with respect to abdominal pain. He's had Crohn's disease for a long time and then 2002 underwent an exploratory laparotomy with resection of the terminal ileum and proximal right colon for complicated Crohn's disease. A year after that, he underwent exploratory laparotomy with lysis of adhesions. He had been doing fairly well since that time but is not developed some chronic intermittent abdominal pain and has been discovered to have a stricture proximal to his anastomosis between the ileum and right colon noted on Colonoscopy and recent SBFT. He is taking Humira which intermittently helps with the symptoms. Dr. Benson Norway has advised him to consider elective intestinal resection and thus he presents to discuss that. He says some days are better than others. He is married with 2 children. He works as a Dealer.  Other Problems Leroy Herrera, [place]; 05/08/2016 2:29 PM) Crohn's Disease  Gastroesophageal Reflux Disease  High blood pressure  Other disease, cancer, significant illness   Past Surgical History Leroy Herrera, [place]; 05/08/2016 2:29 PM) Appendectomy  Colon Removal - Partial  Gallbladder Surgery - Laparoscopic  Resection of Small Bowel   Diagnostic Studies History Leroy Herrera, [place]; 05/08/2016 2:29 PM) Colonoscopy  1-5 years ago  Allergies Leroy Herrera, CMA; 05/08/2016 2:33 PM) PredniSONE (Pak) *CORTICOSTEROIDS*  Anaphylaxis. Vicodin *ANALGESICS - OPIOID*  Hives, Itching. Morphine Sulfate (Concentrate) *ANALGESICS - OPIOID*   Hives.  Medication History Leroy Herrera, [place]; 05/08/2016 2:37 PM) Humira (20MG/0.4ML Kit, Subcutaneous daily) Active. CeleBREX (200MG Capsule, Oral daily) Active. Bentyl (20MG Tablet, Oral daily) Active. Zofran (8MG Tablet, Oral as needed) Active. Oxycodone-Acetaminophen (5-325MG Tablet, Oral as needed) Active. Protonix (40MG Tablet DR, Oral daily) Active. Mirapex (0.125MG Tablet, Oral daily) Active. Zantac (150MG Tablet, Oral daily) Active. Chantix Starting Month Pak (0.5 MG X 11 &1 MG X 42 Tablet, Oral daily) Active. Medications Reconciled  Social History Leroy Herrera, [place]; 05/08/2016 2:29 PM) Caffeine use  Carbonated beverages, Tea. Illicit drug use  Remotely quit drug use. No alcohol use  Tobacco use  Current every day smoker.  Family History Leroy Herrera, [place]; 05/08/2016 2:29 PM) Arthritis  Father. Colon Polyps  Father. Depression  Brother. Diabetes Mellitus  Father, Mother. Heart Disease  Father. Hypertension  Brother, Father, Mother. Respiratory Condition  Father.    Review of Systems Leroy Herrera CMA; 05/08/2016 2:29 PM) General Not Present- Appetite Loss, Chills, Fatigue, Fever, Night Sweats, Weight Gain and Weight Loss. Skin Not Present- Change in Wart/Mole, Dryness, Hives, Jaundice, New Lesions, Non-Healing Wounds, Rash and Ulcer. Breast Not Present- Breast Mass, Breast Pain, Nipple Discharge and Skin Changes. Gastrointestinal Present- Abdominal Pain, Bloating, Chronic diarrhea, Constipation and Nausea. Not Present- Bloody Stool, Change in Bowel Habits, Difficulty Swallowing, Excessive gas, Gets full quickly at meals, Hemorrhoids, Indigestion, Rectal Pain and Vomiting. Musculoskeletal Present- Joint Pain. Not Present- Back Pain, Joint Stiffness, Muscle Pain, Muscle Weakness and Swelling of Extremities. Neurological Not Present- Decreased Memory, Fainting, Headaches, Numbness, Seizures, Tingling, Tremor, Trouble walking and  Weakness. Psychiatric Not Present- Anxiety, Bipolar, Change in Sleep Pattern, Depression, Fearful and Frequent crying. Endocrine Not Present- Cold Intolerance, Excessive Hunger, Hair Changes, Heat Intolerance, Hot flashes and New  Diabetes. Hematology Not Present- Blood Thinners, Easy Bruising, Excessive bleeding, Gland problems, HIV and Persistent Infections.    Physical Exam Leroy Herrera; 05/08/2016 3:08 PM) The physical exam findings are as follows: Note:General: WDWN in NAD. Pleasant and cooperative.  HEENT: [place]/AT, no external nasal or ear masses, mucous membranes are moist  EYES: EOMI, no scleral icterus, pupils normal  NECK: Supple, no trachea deviation  CV: RRR, no murmur, no edema  CHEST: Breath sounds equal and clear. Respirations nonlabored.  ABDOMEN: Soft, nontender, nondistended, no masses, no organomegaly, small upper abdominal scars, midline scar that begins just above the umbilicus and extends down to the lower midline, no hernias  MUSCULOSKELETAL: FROM, good muscle tone, no edema, no venous stasis changes, normal station and gait  SKIN: No jaundice.  NEUROLOGIC: Alert and oriented, answers questions appropriately, normal gait and station.  PSYCHIATRIC: Normal mood, affect , and behavior.    Assessment & Plan Leroy Herrera; 05/08/2016 3:10 PM) CROHN'S DISEASE OF ILEUM WITH COMPLICATION (C14.481) Impression: He has a fairly tight stricture proximal to his previous anastomosis and is intermittently symptomatic from that despite Humira.  Plan: We discussed laparoscopic assisted possible open resection of distal ileum and part of right colon with primary anastomosis. I have explained the procedure and risks of colon resection. Risks include but are not limited to bleeding, infection, wound problems, anesthesia, anastomotic leak, need for reoperative surgery, injury to intraabominal organs (such as intestine, spleen, kidney, bladder, ureter, etc.),  ileus, irregular bowel habits. He seems to understand all this.  [name], M.D.

## 2016-07-17 NOTE — Interval H&P Note (Signed)
History and Physical Interval Note:  07/17/2016 8:10 AM  Leroy Herrera  has presented today for surgery, with the diagnosis of Crohns disease of ileum with stricture  The various methods of treatment have been discussed with the patient and family. After consideration of risks, benefits and other options for treatment, the patient has consented to  Procedure(s): LAPAROSCOPIC POSSIBLE OPEN RESECTION OF DISTAL ILEUM AND PART OF  RIGHT COLON (Right) as a surgical intervention .  The patient's history has been reviewed, patient examined, no change in status, stable for surgery.  I have reviewed the patient's chart and labs.  Questions were answered to the patient's satisfaction.     Roddrick Sharron Lenna Sciara

## 2016-07-17 NOTE — Transfer of Care (Signed)
Immediate Anesthesia Transfer of Care Note  Patient: Leroy Herrera  Procedure(s) Performed: Procedure(s): LAPAROSCOPIC HAND ASSISTED TERMINAL ILEUM AND RIGHT COLON (Right)  Patient Location: PACU  Anesthesia Type:General  Level of Consciousness: awake, alert , oriented and patient cooperative  Airway & Oxygen Therapy: Patient Spontanous Breathing and Patient connected to face mask oxygen  Post-op Assessment: Report given to RN, Post -op Vital signs reviewed and stable and Patient moving all extremities  Post vital signs: Reviewed and stable  Last Vitals:  Vitals:   07/17/16 0628  BP: (!) 132/92  Pulse: 97  Resp: 18  Temp: 36.7 C    Last Pain:  Vitals:   07/17/16 0628  TempSrc: Oral      Patients Stated Pain Goal: 4 (81/44/81 8563)  Complications: No apparent anesthesia complications

## 2016-07-17 NOTE — Anesthesia Procedure Notes (Signed)
Procedure Name: Intubation Date/Time: 07/17/2016 8:37 AM Performed by: Carleene Cooper A Pre-anesthesia Checklist: Patient identified, Emergency Drugs available, Suction available and Patient being monitored Patient Re-evaluated:Patient Re-evaluated prior to inductionOxygen Delivery Method: Circle system utilized Preoxygenation: Pre-oxygenation with 100% oxygen Intubation Type: IV induction Ventilation: Mask ventilation without difficulty Laryngoscope Size: Mac and 3 Grade View: Grade II Tube type: Oral Tube size: 7.5 mm Number of attempts: 1 Airway Equipment and Method: Oral airway Placement Confirmation: ETT inserted through vocal cords under direct vision,  positive ETCO2 and breath sounds checked- equal and bilateral Secured at: 21 cm Tube secured with: Tape Dental Injury: Teeth and Oropharynx as per pre-operative assessment

## 2016-07-17 NOTE — Op Note (Signed)
Operative Note  Leroy Herrera male 36 y.o. 07/17/2016  PREOPERATIVE DX:  Crohn's disease with partial small bowel obstruction due to distal ileum stricture  POSTOPERATIVE DX:  Same  PROCEDURE:   Laparoscopic, hand-assisted resection of distal ileum and right colon         Surgeon: [name]   Assistants: Verita Lamb, M.D.  Anesthesia: General endotracheal anesthesia  Indications:   This is a 36 year old male with Crohn disease who had a previous resection of his terminal ileum and proximal right colon. Despite medical therapy, he has developed a stricture just proximal to the neo-anastomosis that is leading to partial small bowel obstruction symptoms that are progressive. He now presents for the above procedure.    Procedure Detail:  He was brought to the operating room placed supine on the operating table and general anesthetic was given. The hair on the abdominal wall was clipped.  A Foley catheter was inserted. An oral gastric tube was inserted. The abdominal wall was widely sterilely prepped and draped. A timeout was performed.  He was placed in slight reverse Trendelenburg position. A 5 mm incision was made in the left subcostal area. Using a 5 mm Optiview trocar and laparoscope, access was gained into the peritoneal cavity and a pneumoperitoneum was created. Inspection of the area underneath trocar demonstrated no evidence of bleeding or organ injury.  Adhesions between the omentum and midline abdominal wall and right lower quadrant were noted. A 5 mm trocar was placed in the left lateral abdomen. Using the Harmonic scalpel, adhesions were divided between omentum and the abdominal wall. A 5 mm trocar was then placed in the lower midline through part of a previous scar. Distal ileum adhesions to the lateral abdominal wall were identified and mobilized sharply and bluntly. The stricture was able to be visualized as was the previous anastomosis. I then directed my attention  toward the transverse colon and mobilized it from the midpoint toward the right lower quadrant using sharp and blunt dissection. The hepatic flexure was mobilized. I then made sure that the distal small bowel was completely mobile such that they could be brought under the midline. The hepatic flexure and proximal transverse colon were also able to be brought under the midline. The 5 mm trocar was removed and a limited lower midline incision was made. A GelPort was placed into the wound. I used hand assistance to further mobilize some of the transverse colon. I then exteriorized the distal ileum. The area of disease was noted and there was creeping fat proximal to this. I divided the distal ileum just proximal to the creeping fat with a linear cutting stapler. I then divided some of the mesentery close to the bowel.  I did not have enough mobilization of the transverse colon. Thus I placed the bowel back into the abdominal cavity, extended the midline incision superiorly, and using hand assistance I further mobilized the transverse colon distal to its midpoint. I then exteriorized this and had enough length to do anastomosis. I divided the transverse colon with the linear cutting stapler and resected the rest the mesentery with the LigaSure. The specimen was then handed off the field and sent to pathology.  A side-to-side anastomosis between the small bowel and midpoint of the transverse colon was performed using the linear cutting stapler. Staple lines were hemostatic. The common defect was closed with a linear noncutting stapler and this was imbricated with 3-0 silk sutures in a Lembert type fashion. A crotch stitch of 3-0  silk was placed. The anastomosis was patent, viable, and under no tension and it was dropped back into the abdominal cavity. 1 L of irrigation was then used. Irrigate out the abdominal cavity. There is no evidence of organ injury or bleeding at this time.  Instruments gloves and gowns were  then changed. I then irrigated the abdominal cavity with 2 more liters of irrigation and inspected. There is no evidence of organ injury or bleeding.  The fascia of the midline incision was then closed with running #1 PDS suture. Repeat laparoscopy was performed. The fascial closure was solid. 4 quadrant and central inspection were performed. There is no evidence of bleeding or organ injury. The CO2 was released and the trocars were removed.  The skin of midline incision and subcutaneous tissue was irrigated and the skin was closed with staples. The trocar site incisions were closed with 4-0 Monocryl subcuticular stitches for by Steri-Strips. Sterile dressings were placed on all wounds.  He tolerated the procedure well, without apparent complications, and was taken to the recovery room in satisfactory condition  Estimated Blood Loss:  300 mL                Specimens: Terminal ileum and remaining right colon        Complications:  * No complications entered in OR log *         Disposition: PACU - hemodynamically stable.         Condition: stable

## 2016-07-18 LAB — CBC
HEMATOCRIT: 37.9 % — AB (ref 39.0–52.0)
Hemoglobin: 12.7 g/dL — ABNORMAL LOW (ref 13.0–17.0)
MCH: 28.8 pg (ref 26.0–34.0)
MCHC: 33.5 g/dL (ref 30.0–36.0)
MCV: 85.9 fL (ref 78.0–100.0)
PLATELETS: 172 10*3/uL (ref 150–400)
RBC: 4.41 MIL/uL (ref 4.22–5.81)
RDW: 12.9 % (ref 11.5–15.5)
WBC: 9.5 10*3/uL (ref 4.0–10.5)

## 2016-07-18 LAB — BASIC METABOLIC PANEL
Anion gap: 5 (ref 5–15)
BUN: 10 mg/dL (ref 6–20)
CHLORIDE: 104 mmol/L (ref 101–111)
CO2: 25 mmol/L (ref 22–32)
CREATININE: 0.99 mg/dL (ref 0.61–1.24)
Calcium: 8.3 mg/dL — ABNORMAL LOW (ref 8.9–10.3)
GFR calc Af Amer: >60 mL/min (ref >60)
GFR calc non Af Amer: >60 mL/min (ref >60)
Glucose, Bld: 125 mg/dL — ABNORMAL HIGH (ref 65–99)
POTASSIUM: 3.9 mmol/L (ref 3.5–5.1)
Sodium: 134 mmol/L — ABNORMAL LOW (ref 135–145)

## 2016-07-18 MED ORDER — KETOROLAC TROMETHAMINE 30 MG/ML IJ SOLN
30.0000 mg | Freq: Four times a day (QID) | INTRAMUSCULAR | Status: AC
  Administered 2016-07-18 – 2016-07-20 (×8): 30 mg via INTRAVENOUS
  Filled 2016-07-18 (×8): qty 1

## 2016-07-18 NOTE — Progress Notes (Signed)
Patient is refusing his Lovenox injection, to the time to let patient know the importance of the injection following surgery and how this could potentially increase the risk for development of clots, particularly since patient is declined to ambulate. Neta Mends RN 7:21 PM 07-18-2016

## 2016-07-18 NOTE — Progress Notes (Signed)
Assessment Principal Problem:   Crohn's disease of distal ileum ([place]) s/p resection of distal ileum and right colon 07/17/16-poor pain control. Active Problems:   Essential hypertension, benign   Plan:  Add Toradol.  Start clear liquids.  Remove foley.   LOS: 1 day     1 Day Post-Op  Subjective: Having a lot of incisional pain.  No n/v.  Wants foley out.  Wife in room.  Objective: Vital signs in last 24 hours: Temp:  [97.3 F (36.3 C)-99.6 F (37.6 C)] 98.6 F (37 C) (01/26 0451) Pulse Rate:  [72-97] 81 (01/26 0451) Resp:  [14-20] 20 (01/26 0838) BP: (117-142)/(78-95) 137/95 (01/26 0451) SpO2:  [93 %-100 %] 100 % (01/26 0838) Last BM Date: 07/16/16  Intake/Output from previous day: 01/25 0701 - 01/26 0700 In: 3696.7 [P.O.:120; I.V.:3466.7; IV Piggyback:110] Out: 1935 [Urine:1910; Blood:25] Intake/Output this shift: No intake/output data recorded.  PE: General- In NAD Abdomen-soft, midline wound clean, hypoactive bowel sounds  Lab Results:   Recent Labs  07/18/16 0505  WBC 9.5  HGB 12.7*  HCT 37.9*  PLT 172   BMET  Recent Labs  07/18/16 0505  NA 134*  K 3.9  CL 104  CO2 25  GLUCOSE 125*  BUN 10  CREATININE 0.99  CALCIUM 8.3*   PT/INR No results for input(s): LABPROT, INR in the last 72 hours. Comprehensive Metabolic Panel:    Component Value Date/Time   NA 134 (L) 07/18/2016 0505   NA 136 07/14/2016 1057   NA 134 06/05/2016 1118   K 3.9 07/18/2016 0505   K 4.7 07/14/2016 1057   CL 104 07/18/2016 0505   CL 104 07/14/2016 1057   CO2 25 07/18/2016 0505   CO2 26 07/14/2016 1057   BUN 10 07/18/2016 0505   BUN 15 07/14/2016 1057   BUN 14 06/05/2016 1118   CREATININE 0.99 07/18/2016 0505   CREATININE 1.04 07/14/2016 1057   CREATININE 1.10 02/19/2016 1512   CREATININE 1.07 09/12/2015 1656   GLUCOSE 125 (H) 07/18/2016 0505   GLUCOSE 87 07/14/2016 1057   CALCIUM 8.3 (L) 07/18/2016 0505   CALCIUM 9.4 07/14/2016 1057   AST 48 (H) 07/14/2016  1057   AST 15 06/05/2016 1118   ALT 79 (H) 07/14/2016 1057   ALT 27 06/05/2016 1118   ALKPHOS 111 07/14/2016 1057   ALKPHOS 98 06/05/2016 1118   BILITOT 0.3 07/14/2016 1057   BILITOT <0.2 06/05/2016 1118   BILITOT 0.3 02/19/2016 1512   PROT 7.0 07/14/2016 1057   PROT 6.9 06/05/2016 1118   PROT 6.8 02/19/2016 1512   ALBUMIN 4.3 07/14/2016 1057   ALBUMIN 4.8 06/05/2016 1118   ALBUMIN 4.5 02/19/2016 1512     Studies/Results: No results found.  Anti-infectives: Anti-infectives    Start     Dose/Rate Route Frequency Ordered Stop   07/17/16 2100  cefoTEtan (CEFOTAN) 2 g in dextrose 5 % 50 mL IVPB     2 g 100 mL/hr over 30 Minutes Intravenous Every 12 hours 07/17/16 1303 07/17/16 2032   07/17/16 0625  cefoTEtan in Dextrose 5% (CEFOTAN) IVPB 2 g     2 g Intravenous On call to O.R. 07/17/16 0973 07/17/16 0841       Martita Brumm J 07/18/2016

## 2016-07-19 NOTE — Progress Notes (Signed)
Assessment Principal Problem:   Crohn's disease of distal ileum ([place]) s/p resection of distal ileum and right colon 07/17/16-better pain control Active Problems:   Essential hypertension, benign   Plan:  Ambulate.  Full liquids.   LOS: 2 days     2 Days Post-Op  Subjective: Feels much better this AM.  Stomach rumbling.  No nausea.  Objective: Vital signs in last 24 hours: Temp:  [98.2 F (36.8 C)-98.6 F (37 C)] 98.2 F (36.8 C) (01/27 0530) Pulse Rate:  [80-88] 80 (01/27 0530) Resp:  [14-20] 14 (01/27 0530) BP: (110-150)/(59-98) 110/72 (01/27 0530) SpO2:  [97 %-100 %] 98 % (01/27 0530) Last BM Date: 07/16/16  Intake/Output from previous day: 01/26 0701 - 01/27 0700 In: 60 [P.O.:60] Out: 2000 [Urine:2000] Intake/Output this shift: No intake/output data recorded.  PE: General- In NAD Abdomen-soft, midline wound clean, hypoactive bowel sounds  Lab Results:   Recent Labs  07/18/16 0505  WBC 9.5  HGB 12.7*  HCT 37.9*  PLT 172   BMET  Recent Labs  07/18/16 0505  NA 134*  K 3.9  CL 104  CO2 25  GLUCOSE 125*  BUN 10  CREATININE 0.99  CALCIUM 8.3*   PT/INR No results for input(s): LABPROT, INR in the last 72 hours. Comprehensive Metabolic Panel:    Component Value Date/Time   NA 134 (L) 07/18/2016 0505   NA 136 07/14/2016 1057   NA 134 06/05/2016 1118   K 3.9 07/18/2016 0505   K 4.7 07/14/2016 1057   CL 104 07/18/2016 0505   CL 104 07/14/2016 1057   CO2 25 07/18/2016 0505   CO2 26 07/14/2016 1057   BUN 10 07/18/2016 0505   BUN 15 07/14/2016 1057   BUN 14 06/05/2016 1118   CREATININE 0.99 07/18/2016 0505   CREATININE 1.04 07/14/2016 1057   CREATININE 1.10 02/19/2016 1512   CREATININE 1.07 09/12/2015 1656   GLUCOSE 125 (H) 07/18/2016 0505   GLUCOSE 87 07/14/2016 1057   CALCIUM 8.3 (L) 07/18/2016 0505   CALCIUM 9.4 07/14/2016 1057   AST 48 (H) 07/14/2016 1057   AST 15 06/05/2016 1118   ALT 79 (H) 07/14/2016 1057   ALT 27 06/05/2016 1118    ALKPHOS 111 07/14/2016 1057   ALKPHOS 98 06/05/2016 1118   BILITOT 0.3 07/14/2016 1057   BILITOT <0.2 06/05/2016 1118   BILITOT 0.3 02/19/2016 1512   PROT 7.0 07/14/2016 1057   PROT 6.9 06/05/2016 1118   PROT 6.8 02/19/2016 1512   ALBUMIN 4.3 07/14/2016 1057   ALBUMIN 4.8 06/05/2016 1118   ALBUMIN 4.5 02/19/2016 1512     Studies/Results: No results found.  Anti-infectives: Anti-infectives    Start     Dose/Rate Route Frequency Ordered Stop   07/17/16 2100  cefoTEtan (CEFOTAN) 2 g in dextrose 5 % 50 mL IVPB     2 g 100 mL/hr over 30 Minutes Intravenous Every 12 hours 07/17/16 1303 07/17/16 2032   07/17/16 0625  cefoTEtan in Dextrose 5% (CEFOTAN) IVPB 2 g     2 g Intravenous On call to O.R. 07/17/16 2633 07/17/16 0841       Carlas Vandyne J 07/19/2016

## 2016-07-19 NOTE — Progress Notes (Signed)
Assumed care of patient from [place], [place]. Pt resting quietly. No distress. Continues Dilaudid PCA that is managing his pain well.

## 2016-07-20 MED ORDER — OXYCODONE-ACETAMINOPHEN 5-325 MG PO TABS
1.0000 | ORAL_TABLET | ORAL | Status: DC | PRN
  Administered 2016-07-20: 1 via ORAL
  Filled 2016-07-20: qty 1

## 2016-07-20 MED ORDER — HYDROMORPHONE HCL 2 MG/ML IJ SOLN: 1.0000 mg | INTRAMUSCULAR | Status: DC | PRN

## 2016-07-20 MED ORDER — PANTOPRAZOLE SODIUM 40 MG PO TBEC
40.0000 mg | DELAYED_RELEASE_TABLET | Freq: Two times a day (BID) | ORAL | Status: DC
  Administered 2016-07-20 – 2016-07-21 (×2): 40 mg via ORAL
  Filled 2016-07-20 (×2): qty 1

## 2016-07-20 MED ORDER — SODIUM CHLORIDE 0.9 % IV SOLN
25.0000 mg | INTRAVENOUS | Status: DC | PRN
  Filled 2016-07-20: qty 0.5

## 2016-07-20 MED ORDER — OXYCODONE-ACETAMINOPHEN 5-325 MG PO TABS
2.0000 | ORAL_TABLET | Freq: Four times a day (QID) | ORAL | Status: DC | PRN
Start: 2016-07-20 — End: 2016-07-21
  Administered 2016-07-20 – 2016-07-21 (×4): 2 via ORAL
  Filled 2016-07-20 (×4): qty 2

## 2016-07-20 NOTE — Progress Notes (Signed)
Pt came off PCA switched to oral medications. I wasted 26m dilaudid in sink with ELottie Dawsonat @ 1000 today. [place]

## 2016-07-20 NOTE — Progress Notes (Signed)
Patient is asking that his pain medication be doubled from one 5-325 oxycodone to two 5-325 pills. I have paged the MD on call and will await any new orders. Pt states his pain is a 6 on 0-10 scale. [place]

## 2016-07-20 NOTE — Progress Notes (Signed)
3 Days Post-Op  Subjective: Patient has passed a lot of flatus - no BM yet Tolerating full liquids No nausea or vomiting No using PCA much - would like to switch to PO pain meds  Objective: Vital signs in last 24 hours: Temp:  [98.2 F (36.8 C)-98.7 F (37.1 C)] 98.2 F (36.8 C) (01/28 0536) Pulse Rate:  [83-102] 83 (01/28 0536) Resp:  [14-21] 18 (01/28 0829) BP: (117-130)/(81-90) 130/90 (01/28 0536) SpO2:  [95 %-100 %] 98 % (01/28 0829) Last BM Date: 07/16/16  Intake/Output from previous day: 01/27 0701 - 01/28 0700 In: 1471.3 [I.V.:1086.3; IV Piggyback:385] Out: 750 [Urine:750] Intake/Output this shift: No intake/output data recorded.  General appearance: alert, cooperative and no distress Resp: clear to auscultation bilaterally Cardio: regular rate and rhythm, S1, S2 normal, no murmur, click, rub or gallop GI: soft, non-distended; + bowel sounds Incisions c/d/i  Lab Results:   Recent Labs  07/18/16 0505  WBC 9.5  HGB 12.7*  HCT 37.9*  PLT 172   BMET  Recent Labs  07/18/16 0505  NA 134*  K 3.9  CL 104  CO2 25  GLUCOSE 125*  BUN 10  CREATININE 0.99  CALCIUM 8.3*   PT/INR No results for input(s): LABPROT, INR in the last 72 hours. ABG No results for input(s): PHART, HCO3 in the last 72 hours.  Invalid input(s): PCO2, PO2  Studies/Results: No results found.  Anti-infectives: Anti-infectives    Start     Dose/Rate Route Frequency Ordered Stop   07/17/16 2100  cefoTEtan (CEFOTAN) 2 g in dextrose 5 % 50 mL IVPB     2 g 100 mL/hr over 30 Minutes Intravenous Every 12 hours 07/17/16 1303 07/17/16 2032   07/17/16 0625  cefoTEtan in Dextrose 5% (CEFOTAN) IVPB 2 g     2 g Intravenous On call to O.R. 07/17/16 0625 07/17/16 0841      Assessment/Plan: Principal Problem:   Crohn's disease of distal ileum ([place]) s/p resection of distal ileum and right colon 07/17/16 Active Problems:   Essential hypertension, benign  Advance to soft diet PO pain meds  - D/C PCA Saline lock IV    LOS: 3 days    Tavari Loadholt K. 07/20/2016

## 2016-07-20 NOTE — Progress Notes (Signed)
PHARMACIST - PHYSICIAN COMMUNICATION CONCERNING: IV to Oral Route Change Policy  RECOMMENDATION: This patient is receiving Protonix by the intravenous route.  Based on criteria approved by the Pharmacy and Therapeutics Committee, the intravenous medication(s) is/are being converted to the equivalent oral dose form(s).   DESCRIPTION: These criteria include:  The patient is eating (either orally or via tube) and/or has been taking other orally administered medications for a least 24 hours  The patient has no evidence of active gastrointestinal bleeding or impaired GI absorption (gastrectomy, short bowel, patient on TNA or NPO).  If you have questions about this conversion, please contact the Pharmacy Department  []   [phone] )  [facility] []   [phone] )  [facility] []   [phone] )  [facility] []   [phone] )  [facility] [x]   [phone] )  [place], [place], [facility] 07/20/2016 2:39 PM

## 2016-07-21 MED ORDER — OXYCODONE-ACETAMINOPHEN 5-325 MG PO TABS
1.0000 | ORAL_TABLET | Freq: Four times a day (QID) | ORAL | 0 refills | Status: DC | PRN
Start: 2016-07-21 — End: 2016-12-18

## 2016-07-21 NOTE — Discharge Instructions (Signed)
[facility]      [facility] [phone]  OPEN ABDOMINAL SURGERY: POST OP INSTRUCTIONS  Always review your discharge instruction sheet given to you by the facility where your surgery was performed.  IF YOU HAVE DISABILITY OR FAMILY LEAVE FORMS, YOU MUST BRING THEM TO THE OFFICE FOR PROCESSING.  PLEASE DO NOT GIVE THEM TO YOUR DOCTOR.  1. A prescription for pain medication may be given to you upon discharge.  Take your pain medication as prescribed, if needed.  If narcotic pain medicine is not needed, then you may take acetaminophen (Tylenol) or ibuprofen (Advil) as needed. 2. Take your usually prescribed medications unless otherwise directed. 3. If you need a refill on your pain medication, please contact your pharmacy. They will contact our office to request authorization.  Prescriptions will not be filled after 5pm or on week-ends. 4. You should follow a light diet the first few days after arrival home, such as soup and crackers, pudding, etc.unless your doctor has advised otherwise. A high-fiber, low fat diet can be resumed as tolerated.   Be sure to include lots of fluids daily. Most patients will experience some swelling and bruising on the chest and neck area.  Ice packs will help.  Swelling and bruising can take several days to resolve 5. Most patients will experience some swelling and bruising in the area of the incision. Ice pack will help. Swelling and bruising can take several days to resolve..  6. It is common to experience some constipation if taking pain medication after surgery.  Increasing fluid intake and taking a stool softener will usually help or prevent this problem from occurring.  A mild laxative (Milk of Magnesia or Miralax) should be taken according to package directions if there are no bowel movements after 48 hours. 7.  You may have steri-strips (small skin tapes) in place directly over the incision.  These strips should be left on the skin for 7-10 days.  If your  surgeon used skin glue on the incision, you may shower in 24 hours.  The glue will flake off over the next 2-3 weeks.  Any sutures or staples will be removed at the office during your follow-up visit. You may find that a light gauze bandage over your incision may keep your staples from being rubbed or pulled. You may shower and replace the bandage daily. 8. ACTIVITIES:  You may resume regular (light) daily activities beginning the next day--such as daily self-care, walking, climbing stairs--gradually increasing activities as tolerated.  You may have sexual intercourse when it is comfortable.  Refrain from any heavy lifting or straining until approved by your doctor. a. You may drive when you no longer are taking prescription pain medication, you can comfortably wear a seatbelt, and you can safely maneuver your car and apply brakes b. Return to Work: ___________________________________ 9. You should see your doctor in the office for a follow-up appointment approximately two weeks after your surgery.  Make sure that you call for this appointment within a day or two after you arrive home to insure a convenient appointment time. OTHER INSTRUCTIONS:  _____________________________________________________________ _____________________________________________________________  WHEN TO CALL YOUR DOCTOR: 1. Fever over 101.0 2. Inability to urinate 3. Nausea and/or vomiting 4. Extreme swelling or bruising 5. Continued bleeding from incision. 6. Increased pain, redness, or drainage from the incision. 7. Difficulty swallowing or breathing 8. Muscle cramping or spasms. 9. Numbness or tingling in hands or feet or around lips.  The clinic staff is available to   answer your questions during regular business hours.  Please don't hesitate to call and ask to speak to one of the nurses if you have concerns.  For further questions, please visit [email or web address]   

## 2016-07-21 NOTE — Progress Notes (Signed)
Patient ID: Leroy Herrera, male   DOB: [birth year], 36 y.o.   MRN: [id number] 4 Days Post-Op   Subjective: Feels well this morning. Pain well controlled on oral meds. Had a normal bowel movement. Tolerating soft diet without difficulty. He is anxious to go home.  Objective: Vital signs in last 24 hours: Temp:  [98.1 F (36.7 C)-99.3 F (37.4 C)] 98.1 F (36.7 C) (01/29 0523) Pulse Rate:  [88-105] 92 (01/29 0523) Resp:  [18] 18 (01/29 0523) BP: (124-144)/(80-90) 124/83 (01/29 0523) SpO2:  [95 %-99 %] 95 % (01/29 0523) Last BM Date: 07/20/16  Intake/Output from previous day: 01/28 0701 - 01/29 0700 In: 830 [P.O.:720; IV Piggyback:110] Out: 150 [Urine:150] Intake/Output this shift: No intake/output data recorded.  General appearance: alert, cooperative and no distress GI: normal findings: soft, non-tender and Nondistended Incision/Wound: No erythema or drainage  Lab Results:  No results for input(s): WBC, HGB, HCT, PLT in the last 72 hours. BMET No results for input(s): NA, K, CL, CO2, GLUCOSE, BUN, CREATININE, CALCIUM in the last 72 hours.   Studies/Results: No results found.  Anti-infectives: Anti-infectives    Start     Dose/Rate Route Frequency Ordered Stop   07/17/16 2100  cefoTEtan (CEFOTAN) 2 g in dextrose 5 % 50 mL IVPB     2 g 100 mL/hr over 30 Minutes Intravenous Every 12 hours 07/17/16 1303 07/17/16 2032   07/17/16 0625  cefoTEtan in Dextrose 5% (CEFOTAN) IVPB 2 g     2 g Intravenous On call to O.R. 07/17/16 3009 07/17/16 0841      Assessment/Plan: s/p Procedure(s): LAPAROSCOPIC HAND ASSISTED TERMINAL ILEUM AND RIGHT COLON Doing well postoperatively without complication. Ready for discharge.   LOS: 4 days    Leroy Herrera 07/21/2016

## 2016-07-21 NOTE — Progress Notes (Signed)
Discharge instructions discussed with patient and wife until no further questions ask. IV discontinued. Am assessment otherwise unchanged

## 2016-08-04 NOTE — Discharge Summary (Signed)
Physician Discharge Summary  Patient ID: Leroy Herrera MRN: [id number] DOB/AGE: [birth year] 36 y.o.  Admit date: 07/17/2016 Discharge date: 07/21/2016  Admission Diagnoses:  Crohn's disease with stricture of distal ileum  Discharge Diagnoses:  Principal Problem:   Crohn's disease ([place]) s/p resection of distal ileum and right colon 07/17/16 Active Problems:   Essential hypertension, benign   Discharged Condition: good  Hospital Course: He underwent the above procedure and tolerated it well.  He was placed on the postop colorectal surgery pathway and progressed along this well.  He met discharge criteria-return of bowel function, eating solid food, ambulating, no complications-by POD #4 and was discharged.     Discharge Exam: Blood pressure 129/90, pulse 92, temperature 98.1 F (36.7 C), temperature source Oral, resp. rate 18, height 6' (1.829 m), weight 85.7 kg (189 lb), SpO2 95 %.   Disposition: 01-Home or Self Care  Discharge Instructions    Discharge patient    Complete by:  As directed    Discharge disposition:  01-Home or Self Care   Discharge patient date:  07/21/2016     Allergies as of 07/21/2016      Reactions   Prednisone Anaphylaxis   High dose allergy   Vicodin [hydrocodone-acetaminophen] Hives, Itching   Morphine And Related Hives, Itching      Medication List    STOP taking these medications   HUMIRA 40 MG/0.8ML Pskt Generic drug:  Adalimumab     TAKE these medications   dexlansoprazole 60 MG capsule Commonly known as:  DEXILANT Take 1 capsule (60 mg total) by mouth daily.   dicyclomine 20 MG tablet Commonly known as:  BENTYL Take 1 tablet (20 mg total) by mouth 2 (two) times daily. What changed:  when to take this  reasons to take this   fluticasone 50 MCG/ACT nasal spray Commonly known as:  FLONASE Place 2 sprays into both nostrils daily. What changed:  when to take this  reasons to take this   ibuprofen 200 MG tablet Commonly  known as:  ADVIL,MOTRIN Take 800 mg by mouth every 6 (six) hours as needed for moderate pain.   lisinopril-hydrochlorothiazide 20-25 MG tablet Commonly known as:  PRINZIDE,ZESTORETIC Take 1 tablet by mouth  daily   ondansetron 8 MG disintegrating tablet Commonly known as:  ZOFRAN-ODT Take 1 tablet (8 mg total) by mouth every 8 (eight) hours as needed for nausea.   oxyCODONE-acetaminophen 5-325 MG tablet Commonly known as:  PERCOCET/ROXICET Take 1-2 tablets by mouth every 6 (six) hours as needed for moderate pain.   RABEprazole 20 MG tablet Commonly known as:  ACIPHEX Take 1 tablet (20 mg total) by mouth daily.   ramelteon 8 MG tablet Commonly known as:  ROZEREM Take 1 tablet (8 mg total) by mouth at bedtime.   ranitidine 150 MG tablet Commonly known as:  ZANTAC Take 150 mg by mouth daily as needed for heartburn.   sucralfate 1 GM/10ML suspension Commonly known as:  CARAFATE Take 10 mLs (1 g total) by mouth 4 (four) times daily -  with meals and at bedtime.   varenicline 1 MG tablet Commonly known as:  CHANTIX CONTINUING MONTH PAK Take 1 tablet (1 mg total) by mouth 2 (two) times daily.   varenicline 0.5 MG X 11 & 1 MG X 42 tablet Commonly known as:  CHANTIX STARTING MONTH PAK Take one 0.5 mg tablet by mouth once daily for 3 days, then increase to one 0.5 mg tablet twice daily for 4 days, then increase  to one 1 mg tablet twice daily.   ZZZQUIL 50 MG/30ML Liqd Generic drug:  DiphenhydrAMINE HCl Take 1 Dose by mouth at bedtime as needed (sleep).      Follow-up Information    Leroy Fahs J, MD. Schedule an appointment as soon as possible for a visit in 1 week(s).   Specialty:  General Surgery Why:  For staple removal Contact information: [address] [place] [address] [phone]           Signed: [name] 08/04/2016, 1:39 PM

## 2016-08-22 ENCOUNTER — Telehealth: Payer: Self-pay | Admitting: Family Medicine

## 2016-08-22 NOTE — Telephone Encounter (Signed)
Needs new [employer] referral auth for gastro.

## 2016-08-25 NOTE — Telephone Encounter (Signed)
Referral placed with [employer]. Referral number [id number] for Dr Benson [place], 6 visits.

## 2016-10-01 ENCOUNTER — Other Ambulatory Visit: Payer: Self-pay | Admitting: Family Medicine

## 2016-10-20 ENCOUNTER — Ambulatory Visit (INDEPENDENT_AMBULATORY_CARE_PROVIDER_SITE_OTHER): Payer: 59 | Admitting: Physician Assistant

## 2016-10-20 ENCOUNTER — Ambulatory Visit (INDEPENDENT_AMBULATORY_CARE_PROVIDER_SITE_OTHER): Payer: 59

## 2016-10-20 ENCOUNTER — Encounter: Payer: Self-pay | Admitting: Physician Assistant

## 2016-10-20 VITALS — BP 134/77 | HR 99 | Temp 98.8°F | Resp 16 | Ht 72.0 in | Wt 184.4 lb

## 2016-10-20 DIAGNOSIS — M545 Low back pain, unspecified: Secondary | ICD-10-CM

## 2016-10-20 LAB — POCT SEDIMENTATION RATE: POCT SED RATE: 5 mm/hr (ref 0–22)

## 2016-10-20 MED ORDER — CYCLOBENZAPRINE HCL 10 MG PO TABS: 5.0000 mg | ORAL_TABLET | Freq: Three times a day (TID) | ORAL | 0 refills | Status: DC | PRN

## 2016-10-20 MED ORDER — MELOXICAM 15 MG PO TABS: 15.0000 mg | ORAL_TABLET | Freq: Every day | ORAL | 0 refills | Status: DC

## 2016-10-20 NOTE — Patient Instructions (Addendum)
  If symptoms persist greater than 6 weeks from today I will order an MRI of your spine.     IF you received an x-ray today, you will receive an invoice from [facility]. Please contact [facility] at [phone] with questions or concerns regarding your invoice.   IF you received labwork today, you will receive an invoice from [facility]. Please contact [facility] at [phone] with questions or concerns regarding your invoice.   Our billing staff will not be able to assist you with questions regarding bills from these companies.  You will be contacted with the lab results as soon as they are available. The fastest way to get your results is to activate your My Chart account. Instructions are located on the last page of this paperwork. If you have not heard from [place] regarding the results in 2 weeks, please contact this office.

## 2016-10-20 NOTE — Progress Notes (Signed)
10/20/2016 12:34 PM   DOB: 1980/07/08 / MRN: [id number]  SUBJECTIVE:  Leroy Herrera is a 36 y.o. male presenting for lower back pain that started after a colon surgery due to a history of Chron disease.  Tells me he started having back pain after surgery upon getting back to work.  Complains that he is mostly having stiffness at the base of his spine and this occurs mostly at the end of the day.  The last two weeks have been particularly bad, and it is difficult for him to get to the end of the day.  He is taking Humira for control of Chron. His GI docs do feel that he is adequately controlled with the Humira, however due to a history of previous joint pain with Humira they are considering alternatives.  He does have a history of heroin abuse.   He is allergic to prednisone; vicodin [hydrocodone-acetaminophen]; and morphine and related.   He  has a past medical history of Allergy; Crohn's disease ([place]); GERD (gastroesophageal reflux disease); Heroin addiction ([place]) (06/23/2009); Hypertension; Pancreatitis; and Renal disorder.    He  reports that he has been smoking Cigarettes.  He has a 20.00 pack-year smoking history. He has never used smokeless tobacco. He reports that he does not drink alcohol or use drugs. He  reports that he currently engages in sexual activity. The patient  has a past surgical history that includes Bowel resection (2001); Tonsillectomy; Colon surgery (2000); Cholecystectomy (2005); Appendectomy (2000); Colonoscopy (2016); and Colon resection (Right, 07/17/2016).  His family history includes Arthritis in his mother; COPD in his father; Diabetes in his father and mother; Heart disease in his father; Hyperlipidemia in his father; Hypertension in his brother and father.  Review of Systems  Constitutional: Negative for chills, diaphoresis and fever.  Eyes: Negative.   Gastrointestinal: Negative for nausea.  Musculoskeletal: Positive for back pain and myalgias. Negative for falls,  joint pain and neck pain.  Skin: Negative for rash.  Neurological: Negative for dizziness, sensory change, speech change, focal weakness and headaches.    The problem list and medications were reviewed and updated by myself where necessary and exist elsewhere in the encounter.   OBJECTIVE:  BP 134/77 (BP Location: Right Arm, Patient Position: Sitting, Cuff Size: Small)   Pulse 99   Temp 98.8 F (37.1 C) (Oral)   Resp 16   Ht 6' (1.829 m)   Wt 184 lb 6.4 oz (83.6 kg)   SpO2 100%   BMI 25.01 kg/m   Physical Exam  Constitutional: He is oriented to person, place, and time. He is active and cooperative.  Eyes: EOM are normal. Pupils are equal, round, and reactive to light.  Cardiovascular: Normal rate.   Pulmonary/Chest: Effort normal. No tachypnea.  Musculoskeletal:       Lumbar back: He exhibits tenderness, pain and spasm (paraspinal/sacral). He exhibits normal range of motion, no bony tenderness, no swelling, no edema, no deformity, no laceration and normal pulse.  Neurological: He is alert and oriented to person, place, and time. He has normal strength and normal reflexes. He is not disoriented. He displays normal reflexes. No cranial nerve deficit or sensory deficit. He exhibits normal muscle tone. Coordination and gait normal.  Skin: Skin is warm.  Psychiatric: His behavior is normal.  Vitals reviewed.   No results found for this or any previous visit (from the past 72 hour(s)).  Dg Lumbar Spine Complete  Result Date: 10/20/2016 CLINICAL DATA:  36 year old presenting with acute midline  low back pain without sciatica. Current history of Crohn's disease which is controlled on Humira. EXAM: LUMBAR SPINE - COMPLETE 4+ VIEW COMPARISON:  CT lumbar spine 02/03/2015. FINDINGS: Five non-rib-bearing lumbar vertebrae with anatomic alignment. No fractures. Well-preserved disc spaces. Left L5 pars defect with associated mild sclerosis, unchanged. No significant facet arthropathy. No interval  change. IMPRESSION: No acute osseous abnormality. Stable left L5 spondylolysis with associated mild sclerosis. Electronically Signed   By: Evangeline Dakin M.D.   On: 10/20/2016 12:24    ASSESSMENT AND PLAN:  [name] was seen today for back pain.  Diagnoses and all orders for this visit:  Acute midline low back pain without sciatica: Meloxicam and flexeril.  He did ask for Norco and I refused given his history.  Will consider tramadol if he is not improving on this regimen.  If he remeains symptomatic in 4-6 weeks will consider referral to orthopedics and or MRI after re--evaluation.  -     DG Lumbar Spine Complete; Future -     POCT SEDIMENTATION RATE    The patient is advised to call or return to clinic if he does not see an improvement in symptoms, or to seek the care of the closest emergency department if he worsens with the above plan.   Philis Fendt, MHS, PA-C [facility] 10/20/2016 12:34 PM

## 2016-11-07 ENCOUNTER — Ambulatory Visit (INDEPENDENT_AMBULATORY_CARE_PROVIDER_SITE_OTHER): Payer: 59 | Admitting: Family Medicine

## 2016-11-07 ENCOUNTER — Encounter: Payer: Self-pay | Admitting: Family Medicine

## 2016-11-07 VITALS — BP 123/81 | HR 88 | Temp 99.3°F | Resp 18 | Ht 72.0 in | Wt 182.6 lb

## 2016-11-07 DIAGNOSIS — F1911 Other psychoactive substance abuse, in remission: Secondary | ICD-10-CM | POA: Diagnosis not present

## 2016-11-07 DIAGNOSIS — S335XXD Sprain of ligaments of lumbar spine, subsequent encounter: Secondary | ICD-10-CM

## 2016-11-07 DIAGNOSIS — K50018 Crohn's disease of small intestine with other complication: Secondary | ICD-10-CM

## 2016-11-07 MED ORDER — TRAMADOL HCL 50 MG PO TABS: 50.0000 mg | ORAL_TABLET | Freq: Three times a day (TID) | ORAL | 0 refills | Status: DC | PRN

## 2016-11-07 NOTE — Progress Notes (Signed)
Subjective:    Patient ID: Leroy Herrera, male    DOB: 1981/01/31, 36 y.o.   MRN: [id number]  11/07/2016  Follow-up (back )   HPI This 36 y.o. male presents for three week follow-up of lower back pain.  Evaluated by PA Carlis Abbott on 10/20/16; s/p lumbar spine films; prescribed Flexeril and Meloxicam.     S/p abdominal surgery end of January 2018. First 24 hours was horrible; very similar pain to original surgery.  After first night, recovery was not bad.  Really fast recovery.  Stomach has been really good; almost back to normal.  Now having a lot of bowel movements.  Has ten small bowel movements every morning.  Curious if can use Imodium.  Consistently having ten bowel movements every morning.  Maintaining on Humira.  Eating really well now.    Has been suffering with lower back pain.  This back pain is different.  This back pain started when started working full time and doing heavy lifting.  May have been compensating with abdominal surgery.  Worsens throughout the day; severe pain when getting off work to get son; then can hardly move at night.  Feels like someone is crushing.  Must lay in bed and apply ice pack.  No improvement since last visit; three week duration. No radiation into legs.  No n/t/burning; some burning in legs but that is chronic intermittently.  No b/b dysfunction.  Taking Meloxicam; felt really weird on Flexeril; feels altered and sleepy.  Only took one at night.   Taking Ibuprofen.  Arthralgias is a common side effect of Humira.  Not sleeping well; chronic insomnia.     BP Readings from Last 3 Encounters:  11/07/16 123/81  10/20/16 134/77  07/21/16 129/90   Wt Readings from Last 3 Encounters:  11/07/16 182 lb 9.6 oz (82.8 kg)  10/20/16 184 lb 6.4 oz (83.6 kg)  07/17/16 189 lb (85.7 kg)    Review of Systems  Constitutional: Negative for activity change, appetite change, chills, diaphoresis, fatigue and fever.  Eyes: Negative for visual disturbance.  Respiratory:  Negative for cough and shortness of breath.   Cardiovascular: Negative for chest pain, palpitations and leg swelling.  Gastrointestinal: Positive for diarrhea. Negative for abdominal distention, abdominal pain, anal bleeding, blood in stool, constipation, nausea, rectal pain and vomiting.  Endocrine: Negative for cold intolerance, heat intolerance, polydipsia, polyphagia and polyuria.  Musculoskeletal: Positive for back pain and myalgias.  Neurological: Negative for dizziness, tremors, seizures, syncope, facial asymmetry, speech difficulty, weakness, light-headedness, numbness and headaches.    Past Medical History:  Diagnosis Date  . Allergy   . Crohn's disease ([place])   . GERD (gastroesophageal reflux disease)   . Heroin addiction ([place]) 06/23/2009   Remission since 2011.  Marland Kitchen Hypertension   . Pancreatitis   . Renal disorder    Nephrolithiasis   Past Surgical History:  Procedure Laterality Date  . APPENDECTOMY  2000  . BOWEL RESECTION  2001  . CHOLECYSTECTOMY  2005  . COLON RESECTION Right 07/17/2016   Procedure: LAPAROSCOPIC HAND ASSISTED TERMINAL ILEUM AND RIGHT COLON;  Surgeon: [name], MD;  Location: [facility];  Service: General;  Laterality: Right;  . COLON SURGERY  2000  . COLONOSCOPY  2016   multiple, last was in 2016  . TONSILLECTOMY     Allergies  Allergen Reactions  . Prednisone Anaphylaxis    High dose allergy  . Vicodin [Hydrocodone-Acetaminophen] Hives and Itching  . Morphine And Related Hives and Itching  Social History   Social History  . Marital status: Married    Spouse name: Estill Bamberg  . Number of children: 2  . Years of education: N/A   Occupational History  . auto Dealer    Social History Main Topics  . Smoking status: Current Every Day Smoker    Packs/day: 1.00    Years: 20.00    Types: Cigarettes  . Smokeless tobacco: Never Used  . Alcohol use No  . Drug use: No     Comment: PREVIOUS HEROIN ADDICTION 2011; RECOVERY SINCE 2011.  Marland Kitchen  Sexual activity: Yes   Other Topics Concern  . Not on file   Social History Narrative   Marital status: Married x 5 years; second marriage. Divorced from first marriage due to heroin addiction.      Children: one son (31 yo [place]) lives with patient and wife; one daughter Larena Glassman) who lives with mother. Gets every Thursday night; then has Thursday to Sunday the following week.      Lives: with wife, son.      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  Narcotic abuse/overuse/misuse in past.      Exercise: none in 2017      Seatbelt: 100%; no texting.       Family History  Problem Relation Age of Onset  . Diabetes Mother   . Arthritis Mother        knee replacement B  . Diabetes Father   . Hyperlipidemia Father   . Hypertension Father   . COPD Father        non-smoker; mill work.  Marland Kitchen Heart disease Father        CHF  . Hypertension Brother        Objective:    BP 123/81   Pulse 88   Temp 99.3 F (37.4 C) (Oral)   Resp 18   Ht 6' (1.829 m)   Wt 182 lb 9.6 oz (82.8 kg)   SpO2 99%   BMI 24.77 kg/m  Physical Exam  Constitutional: He is oriented to person, place, and time. He appears well-developed and well-nourished. No distress.  HENT:  Head: Normocephalic and atraumatic.  Right Ear: External ear normal.  Left Ear: External ear normal.  Nose: Nose normal.  Mouth/Throat: Oropharynx is clear and moist.  Eyes: Conjunctivae and EOM are normal. Pupils are equal, round, and reactive to light.  Neck: Normal range of motion. Neck supple. Carotid bruit is not present. No thyromegaly present.  Cardiovascular: Normal rate, regular rhythm, normal heart sounds and intact distal pulses.  Exam reveals no gallop and no friction rub.   No murmur heard. Pulmonary/Chest: Effort normal and breath sounds normal. He has no wheezes. He has no rales.    Abdominal: Soft. Bowel sounds are normal. He exhibits no distension and no mass. There is no tenderness. There is no rebound and no guarding.  Musculoskeletal:       Lumbar back: He exhibits decreased range of motion, pain and spasm. He exhibits no tenderness, no bony tenderness and normal pulse.  Lumbar spine:  Non-tender midline; non-tender paraspinal regions B.  Straight leg raises negative B; toe and heel walking intact; marching intact; motor 5/5 BLE. Decreased ROM lumbar spine throughout due to pain.   Lymphadenopathy:    He  has no cervical adenopathy.  Neurological: He is alert and oriented to person, place, and time. No cranial nerve deficit.  Skin: Skin is warm and dry. No rash noted. He is not diaphoretic.  Psychiatric: He has a normal mood and affect. His behavior is normal.  Nursing note and vitals reviewed.  Results for orders placed or performed in visit on 10/20/16  POCT SEDIMENTATION RATE  Result Value Ref Range   POCT SED RATE 5 0 - 22 mm/hr       Assessment & Plan:   1. Lumbar sprain, subsequent encounter   2. Crohn's disease of small intestine with other complication ([place])   3. Substance abuse in remission    -persistent lower back pain; rx for Tramadol provided; home exercise program provided; overuse injury; recommend rest, stretching,avoiding heavy lifting.  Call in 2-4 weeks if no improvement for referral to physical therapy and/or orthopedics. -avoid prolonged tramadol use and avoid opiate use due to heroine addiction in remission. -doing very well s/p recent distal ileum resection.   No orders of the defined types were placed in this encounter.  Meds ordered this encounter  Medications  . traMADol (ULTRAM) 50 MG tablet    Sig: Take 1-2 tablets (50-100 mg total) by mouth every 8 (eight) hours as needed.    Dispense:  45 tablet    Refill:  0    Return in about 6 months (around 05/10/2017) for complete physical examiniation.   Emeril Stille Elayne Guerin,  M.D. Primary Care at [facility] [address] [place], [place]  [address] [phone] phone [phone] fax

## 2016-11-07 NOTE — Patient Instructions (Addendum)
IF you received an x-ray today, you will receive an invoice from [facility]. Please contact [facility] at [phone] with questions or concerns regarding your invoice.   IF you received labwork today, you will receive an invoice from [facility]. Please contact [facility] at [phone] with questions or concerns regarding your invoice.   Our billing staff will not be able to assist you with questions regarding bills from these companies.  You will be contacted with the lab results as soon as they are available. The fastest way to get your results is to activate your My Chart account. Instructions are located on the last page of this paperwork. If you have not heard from [place] regarding the results in 2 weeks, please contact this office.    Spondylolisthesis Rehab Ask your health care provider which exercises are safe for you. Do exercises exactly as told by your health care provider and adjust them as directed. It is normal to feel mild stretching, pulling, tightness, or discomfort as you do these exercises, but you should stop right away if you feel sudden pain or your pain gets worse. Do not begin these exercises until told by your health care provider. Stretching and range of motion exercises These exercises warm up your muscles and joints and improve the movement and flexibility of your hips and your back. These exercises may also help to relieve pain, numbness, and tingling. Exercise A: Single knee to chest   1. Lie on your back on a firm surface with both legs straight. 2. Bend one of your knees. Use your hands to move your knee up toward your chest until you feel a gentle stretch in your lower back and buttock.  Hold your leg in this position by holding onto the front of your knee.  Keep your other leg as straight as possible. 3. Hold for __________ seconds. 4. Slowly return to the starting position. 5. Repeat this exercise with your other leg. Repeat __________  times. Complete this exercise __________ times a day. Exercise B: Double knee to chest   1. Lie on your back on a firm surface with both legs straight. 2. Bend one of your knees and move it toward your chest until you feel a gentle stretch in your lower back and buttock. 3. Tense your abdominal muscles and repeat the previous step with your other leg. 4. Hold both of your legs in this position by holding onto the backs of your thighs or the fronts of your knees. 5. Hold for __________ seconds. 6. Tense your abdominal muscles and slowly move your legs back to the floor, one leg at a time. Repeat __________ times. Complete this exercise __________ times a day. Strengthening exercises These exercises build strength and endurance in your back. Endurance is the ability to use your muscles for a long time, even after they get tired. Exercise C: Pelvic tilt  1. Lie on your back on a firm bed or the floor. Bend your knees and keep your feet flat. 2. Tense your abdominal muscles. Tip your pelvis up toward the ceiling and flatten your lower back into the floor.  To help with this exercise, you may place a small towel under your lower back and try to push your back into the towel. 3. Hold for __________ seconds. 4. Let your muscles relax completely before you repeat this exercise. Repeat __________ times. Complete this exercise __________ times a day. Exercise D: Abdominal crunch   1. Lie on your back on  a firm surface. Bend your knees and keep your feet flat. Cross your arms over your chest. 2. Tuck your chin down toward your chest, without bending your neck. 3. Use your abdominal muscles to lift your upper body off of the ground, straight up into the air.  Try to lift yourself until your shoulder blades are off the ground. You may need to work up to this.  Keep your lower back on the ground while you crunch upward.  Do not hold your breath. 4. Slowly lower yourself down. Keep your abdominal  muscles tense until you are back to the starting position. Repeat __________ times. Complete this exercise __________ times a day. Exercise E: Alternating arm and leg raises   1. Get on your hands and knees on a firm surface. If you are on a hard floor, you may want to use padding to cushion your knees, such as an exercise mat. 2. Line up your arms and legs. Your hands should be below your shoulders, and your knees should be below your hips. 3. Lift your left leg behind you. At the same time, raise your right arm and straighten it in front of you.  Do not lift your leg higher than your hip.  Do not lift your arm higher than your shoulder.  Keep your abdominal and back muscles tight.  Keep your hips facing the ground.  Do not arch your back.  Keep your balance carefully, and do not hold your breath. 4. Hold for __________ seconds. 5. Slowly return to the starting position and repeat with your right leg and your left arm. Repeat __________ times. Complete this exercise __________ times a day. Posture and body mechanics   Body mechanics refers to the movements and positions of your body while you do your daily activities. Posture is part of body mechanics. Good posture and healthy body mechanics can help to relieve stress in your body's tissues and joints. Good posture means that your spine is in its natural S-curve position (your spine is neutral), your shoulders are pulled back slightly, and your head is not tipped forward. The following are general guidelines for applying improved posture and body mechanics to your everyday activities. Standing    When standing, keep your spine neutral and your feet about hip-width apart. Keep a slight bend in your knees. Your ears, shoulders, and hips should line up.  When you do a task in which you stand in one place for a long time, place one foot up on a stable object that is 2-4 inches (5-10 cm) high, such as a footstool. This helps keep your spine  neutral. Sitting    When sitting, keep your spine neutral and keep your feet flat on the floor. Use a footrest, if necessary, and keep your thighs parallel to the floor. Avoid rounding your shoulders, and avoid tilting your head forward.  When working at a desk or a computer, keep your desk at a height where your hands are slightly lower than your elbows. Slide your chair under your desk so you are close enough to maintain good posture.  When working at a computer, place your monitor at a height where you are looking straight ahead and you do not have to tilt your head forward or downward to look at the screen. Resting   When lying down and resting, avoid positions that are most painful for you.  If you have pain with activities such as sitting, bending, stooping, or squatting (flexion-based activities), lie in  a position in which your body does not bend very much. For example, avoid curling up on your side with your arms and knees near your chest (fetal position).  If you have pain with activities such as standing for a long time or reaching with your arms (extension-based activities), lie with your spine in a neutral position and bend your knees slightly. Try the following positions:  Lying on your side with a pillow between your knees.  Lying on your back with a pillow under your knees. Lifting    When lifting objects, keep your feet at least shoulder-width apart and tighten your abdominal muscles.  Bend your knees and hips and keep your spine neutral. It is important to lift using the strength of your legs, not your back. Do not lock your knees straight out.  Always ask for help to lift heavy or awkward objects. This information is not intended to replace advice given to you by your health care provider. Make sure you discuss any questions you have with your health care provider. Document Released: 06/09/2005 Document Revised: 02/14/2016 Document Reviewed: 03/20/2015 Elsevier  Interactive Patient Education  2017 [employer].

## 2016-12-08 DIAGNOSIS — F1911 Other psychoactive substance abuse, in remission: Secondary | ICD-10-CM | POA: Insufficient documentation

## 2016-12-09 ENCOUNTER — Telehealth: Payer: Self-pay | Admitting: Family Medicine

## 2016-12-09 DIAGNOSIS — S3992XD Unspecified injury of lower back, subsequent encounter: Secondary | ICD-10-CM

## 2016-12-09 NOTE — Telephone Encounter (Signed)
Pt's wife called regarding referral to chiropractor for pt. She said he has been to one in the past and would like to go back. Pt has [facility] that requires a referral for these visits. I saw that Dr. Tamala Julian had mentioned placing a referral for Ortho and/or PT if the pt called back with the same concerns. I told the pt's wife this and she said she believes he would still like to see a Restaurant manager, fast food. The chiropractor is Dr. Jola Baptist on [address]. I let the pt's wife know Dr. Tamala Julian is out of the office until next week. Pt's wife Amanda's callback number is [phone].

## 2016-12-10 NOTE — Telephone Encounter (Signed)
Please advise 

## 2016-12-10 NOTE — Telephone Encounter (Signed)
Chiropractic referral placed; please call and advise pt.

## 2016-12-12 NOTE — Telephone Encounter (Signed)
Received [facility]igate Authorization for pt referral for Chiropractor. Authorization number is [id number] and is valid from 12/12/16 for 6 months or 6 visits for Dr. Jola Baptist at [facility] on [place], [place]. I spoke with pt's wife Estill Bamberg and let her know we received auth and I am faxing a copy with the referral.

## 2016-12-16 ENCOUNTER — Other Ambulatory Visit: Payer: Self-pay | Admitting: Family Medicine

## 2017-01-13 ENCOUNTER — Telehealth: Payer: Self-pay | Admitting: Family Medicine

## 2017-01-13 NOTE — Telephone Encounter (Signed)
Pt's wife Estill Bamberg called requesting new referral through [facility] for pt to return to [place]. She said he has used his 6 authorized visits and the treatment seems to be working. New auth obtained for Dr. Jola Baptist at [facility] on [address] in [place], [place]. Authorization number is valid from 01/13/17. Number is [id number] and is valid for 6 months or 6 visits. I called pt's wife to let her know I received this and asked her to call me back to give more details. I have also faxed a copy of the referral to [facility] on 7/24.

## 2017-01-26 ENCOUNTER — Other Ambulatory Visit: Payer: Self-pay | Admitting: Physician Assistant

## 2017-02-09 ENCOUNTER — Other Ambulatory Visit: Payer: Self-pay | Admitting: Family Medicine

## 2017-02-26 ENCOUNTER — Telehealth: Payer: Self-pay | Admitting: Family Medicine

## 2017-02-26 DIAGNOSIS — K5000 Crohn's disease of small intestine without complications: Secondary | ICD-10-CM

## 2017-02-26 NOTE — Telephone Encounter (Signed)
Pt wife called stated that he would like a referral to Gastro with Dr. Carol Ada.. Pt has [facility] navigate..  Please advise

## 2017-03-02 NOTE — Telephone Encounter (Signed)
Called patient and let him know referral was sent.

## 2017-03-02 NOTE — Telephone Encounter (Signed)
Referral has been placed.  Please advise patient.

## 2017-03-30 ENCOUNTER — Other Ambulatory Visit: Payer: Self-pay

## 2017-03-30 ENCOUNTER — Encounter: Payer: Self-pay | Admitting: Family Medicine

## 2017-03-30 ENCOUNTER — Ambulatory Visit (INDEPENDENT_AMBULATORY_CARE_PROVIDER_SITE_OTHER): Payer: 59 | Admitting: Family Medicine

## 2017-03-30 VITALS — BP 112/70 | HR 113 | Temp 99.2°F | Resp 17 | Ht 72.0 in | Wt 186.6 lb

## 2017-03-30 DIAGNOSIS — J301 Allergic rhinitis due to pollen: Secondary | ICD-10-CM | POA: Diagnosis not present

## 2017-03-30 DIAGNOSIS — K219 Gastro-esophageal reflux disease without esophagitis: Secondary | ICD-10-CM | POA: Diagnosis not present

## 2017-03-30 DIAGNOSIS — Z720 Tobacco use: Secondary | ICD-10-CM | POA: Diagnosis not present

## 2017-03-30 DIAGNOSIS — I10 Essential (primary) hypertension: Secondary | ICD-10-CM | POA: Diagnosis not present

## 2017-03-30 DIAGNOSIS — S43421A Sprain of right rotator cuff capsule, initial encounter: Secondary | ICD-10-CM | POA: Diagnosis not present

## 2017-03-30 DIAGNOSIS — K432 Incisional hernia without obstruction or gangrene: Secondary | ICD-10-CM | POA: Diagnosis not present

## 2017-03-30 DIAGNOSIS — J0100 Acute maxillary sinusitis, unspecified: Secondary | ICD-10-CM

## 2017-03-30 DIAGNOSIS — K50018 Crohn's disease of small intestine with other complication: Secondary | ICD-10-CM

## 2017-03-30 MED ORDER — AMOXICILLIN-POT CLAVULANATE 875-125 MG PO TABS: 1.0000 | ORAL_TABLET | Freq: Two times a day (BID) | ORAL | 0 refills | Status: DC

## 2017-03-30 MED ORDER — ONDANSETRON 8 MG PO TBDP: 8.0000 mg | ORAL_TABLET | Freq: Three times a day (TID) | ORAL | 3 refills | Status: DC | PRN

## 2017-03-30 NOTE — Patient Instructions (Signed)
     IF you received an x-ray today, you will receive an invoice from [facility]. Please contact [facility] at [phone] with questions or concerns regarding your invoice.   IF you received labwork today, you will receive an invoice from [facility]. Please contact [facility] at [phone] with questions or concerns regarding your invoice.   Our billing staff will not be able to assist you with questions regarding bills from these companies.  You will be contacted with the lab results as soon as they are available. The fastest way to get your results is to activate your My Chart account. Instructions are located on the last page of this paperwork. If you have not heard from us regarding the results in 2 weeks, please contact this office.     

## 2017-03-30 NOTE — Progress Notes (Signed)
Subjective:    Patient ID: Leroy Herrera, male    DOB: [birth year], 36 y.o.   MRN: [id number]  03/30/2017  Hernia (? hernia beside belly button, right shoulder pain intermittent no pain today, lg temp cold sxs x 2 weeks)    HPI This 36 y.o. male presents for evaluation of possible abdominal hernia, cough/congestion, R shoulder pain.  Onset of abdominal bulge in past two months near incision site.  S/p abdominal surgery in January 2018.  Job is very physically demanding and lifts and pushes heavy items all day long.  Location of bulge can be tender intermittently.  Dr. Vito Berger performed surgery for Crohn's disease in January 2018.  No nausea/vomiting/diarrhea/bloody stools.    R shoulder pain: onset four weeks ago; was occurring daily with certain movements; now pain has resolved for the past four days.  Only occurred with certain movements; no associated neck pain.  No numbness/tingling/weakness.  Can ice shoulder and take Ibuprofen with improvement.  Cold: onset two weeks ago.  No fever/chills/sweats yet does have a low grade fever today.  No ear pain or sore throat.  +rhinorrhea; +nasal congestion. +improving sinus pressure.  +coughing with sputum production; no SOB.  No n/v/d.  Smoking 2 ppd.  Using cough syrup and Flonase.   Crohn's disease: doing really well on Humira.  No abdominal pain; no n/v/d; no constipation; no bloody stools or melena.  GERD: Patient reports good compliance with medication, good tolerance to medication, and good symptom control.  Dexilant works better than Aciphex or any other agent.  HTN: Patient reports good compliance with medication, good tolerance to medication, and good symptom control.  Not checking BP at home.    BP Readings from Last 3 Encounters:  03/30/17 112/70  11/07/16 123/81  10/20/16 134/77   Wt Readings from Last 3 Encounters:  03/30/17 186 lb 9.6 oz (84.6 kg)  11/07/16 182 lb 9.6 oz (82.8 kg)  10/20/16 184 lb 6.4 oz (83.6 kg)    Immunization History  Administered Date(s) Administered  . Influenza Split 03/25/2012  . Influenza,inj,Quad PF,6+ Mos 03/28/2013, 09/12/2015, 02/19/2016  . PPD Test 06/05/2016  . Pneumococcal Polysaccharide-23 02/19/2016  . Td 06/24/2011    Review of Systems  Constitutional: Negative for activity change, appetite change, chills, diaphoresis, fatigue and fever.  HENT: Positive for congestion, postnasal drip, rhinorrhea and sinus pressure. Negative for ear pain, sinus pain, sore throat, trouble swallowing and voice change.   Respiratory: Positive for cough. Negative for shortness of breath and wheezing.   Cardiovascular: Negative for chest pain, palpitations and leg swelling.  Gastrointestinal: Negative for abdominal distention, abdominal pain, anal bleeding, blood in stool, constipation, diarrhea, nausea, rectal pain and vomiting.  Endocrine: Negative for cold intolerance, heat intolerance, polydipsia, polyphagia and polyuria.  Musculoskeletal: Positive for arthralgias. Negative for neck pain and neck stiffness.  Skin: Negative for color change, rash and wound.  Neurological: Negative for dizziness, tremors, seizures, syncope, facial asymmetry, speech difficulty, weakness, light-headedness, numbness and headaches.  Psychiatric/Behavioral: Negative for dysphoric mood and sleep disturbance. The patient is not nervous/anxious.     Past Medical History:  Diagnosis Date  . Allergy   . Crohn's disease ([place])   . GERD (gastroesophageal reflux disease)   . Heroin addiction ([place]) 06/23/2009   Remission since 2011.  Marland Kitchen Hypertension   . Pancreatitis   . Renal disorder    Nephrolithiasis   Past Surgical History:  Procedure Laterality Date  . APPENDECTOMY  2000  . BOWEL RESECTION  2001  .  CHOLECYSTECTOMY  2005  . COLON RESECTION Right 07/17/2016   Procedure: LAPAROSCOPIC HAND ASSISTED TERMINAL ILEUM AND RIGHT COLON;  Surgeon: [name], MD;  Location: [facility];  Service: General;   Laterality: Right;  . COLON SURGERY  2000  . COLONOSCOPY  2016   multiple, last was in 2016  . TONSILLECTOMY     Allergies  Allergen Reactions  . Prednisone Anaphylaxis    High dose allergy  . Vicodin [Hydrocodone-Acetaminophen] Hives and Itching  . Morphine And Related Hives and Itching   Current Outpatient Prescriptions on File Prior to Visit  Medication Sig Dispense Refill  . cyclobenzaprine (FLEXERIL) 10 MG tablet Take 0.5-1 tablets (5-10 mg total) by mouth 3 (three) times daily as needed for muscle spasms (May cause drowsiness.). 30 tablet 0  . DEXILANT 60 MG capsule TAKE 1 CAPSULE BY MOUTH  ONCE DAILY 90 capsule 3  . dicyclomine (BENTYL) 20 MG tablet Take 1 tablet (20 mg total) by mouth 2 (two) times daily. (Patient taking differently: Take 20 mg by mouth 2 (two) times daily as needed for spasms. ) 20 tablet 0  . DiphenhydrAMINE HCl (ZZZQUIL) 50 MG/30ML LIQD Take 1 Dose by mouth at bedtime as needed (sleep).    . fluticasone (FLONASE) 50 MCG/ACT nasal spray Place 2 sprays into both nostrils daily. (Patient taking differently: Place 2 sprays into both nostrils daily as needed for allergies. ) 48 g 3  . ibuprofen (ADVIL,MOTRIN) 200 MG tablet Take 800 mg by mouth every 6 (six) hours as needed for moderate pain.     Marland Kitchen lisinopril-hydrochlorothiazide (PRINZIDE,ZESTORETIC) 20-25 MG tablet Take 1 tablet by mouth daily. Take 1 tablet by mouth  Daily   OFFICE VISIT NEEDED FOR 90 DAY SUPPLY 90 tablet 0  . meloxicam (MOBIC) 15 MG tablet Take 1 tablet (15 mg total) by mouth daily. 30 tablet 0  . ramelteon (ROZEREM) 8 MG tablet Take 1 tablet (8 mg total) by mouth at bedtime. 30 tablet 5  . ranitidine (ZANTAC) 150 MG tablet Take 150 mg by mouth daily as needed for heartburn.    . traMADol (ULTRAM) 50 MG tablet Take 1-2 tablets (50-100 mg total) by mouth every 8 (eight) hours as needed. 45 tablet 0  . varenicline (CHANTIX CONTINUING MONTH PAK) 1 MG tablet Take 1 tablet (1 mg total) by mouth 2 (two)  times daily. 180 tablet 0  . varenicline (CHANTIX STARTING MONTH PAK) 0.5 MG X 11 & 1 MG X 42 tablet Take one 0.5 mg tablet by mouth once daily for 3 days, then increase to one 0.5 mg tablet twice daily for 4 days, then increase to one 1 mg tablet twice daily. 53 tablet 0   No current facility-administered medications on file prior to visit.    Social History   Social History  . Marital status: Married    Spouse name: Estill Bamberg  . Number of children: 2  . Years of education: N/A   Occupational History  . auto Dealer    Social History Main Topics  . Smoking status: Current Every Day Smoker    Packs/day: 1.00    Years: 20.00    Types: Cigarettes  . Smokeless tobacco: Never Used  . Alcohol use No  . Drug use: No     Comment: PREVIOUS HEROIN ADDICTION 2011; RECOVERY SINCE 2011.  Marland Kitchen Sexual activity: Yes   Other Topics Concern  . Not on file   Social History Narrative   Marital status: Married x 5 years; second marriage.  Divorced from first marriage due to heroin addiction.      Children: one son (31 yo [place]) lives with patient and wife; one daughter Larena Glassman) who lives with mother. Gets every Thursday night; then has Thursday to Sunday the following week.      Lives: with wife, son.      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  Narcotic abuse/overuse/misuse in past.      Exercise: none in 2017      Seatbelt: 100%; no texting.       Family History  Problem Relation Age of Onset  . Diabetes Mother   . Arthritis Mother        knee replacement B  . Diabetes Father   . Hyperlipidemia Father   . Hypertension Father   . COPD Father        non-smoker; mill work.  Marland Kitchen Heart disease Father        CHF  . Hypertension Brother        Objective:    BP 112/70 (BP Location: Left Arm, Patient Position: Sitting, Cuff Size: Normal)   Pulse (!)  113   Temp 99.2 F (37.3 C) (Oral)   Resp 17   Ht 6' (1.829 m)   Wt 186 lb 9.6 oz (84.6 kg)   SpO2 97%   BMI 25.31 kg/m  Physical Exam  Constitutional: He is oriented to person, place, and time. He appears well-developed and well-nourished. No distress.  HENT:  Head: Normocephalic and atraumatic.  Right Ear: External ear normal.  Left Ear: External ear normal.  Nose: Nose normal.  Mouth/Throat: Oropharynx is clear and moist.  Eyes: Pupils are equal, round, and reactive to light. Conjunctivae and EOM are normal.  Neck: Normal range of motion. Neck supple. Carotid bruit is not present. No thyromegaly present.  Cardiovascular: Normal rate, regular rhythm, normal heart sounds and intact distal pulses.  Exam reveals no gallop and no friction rub.   No murmur heard. Pulmonary/Chest: Effort normal and breath sounds normal. He has no wheezes. He has no rales.  Abdominal: Soft. Bowel sounds are normal. He exhibits no distension and no mass. There is no tenderness. There is no rebound, no guarding and no CVA tenderness. A hernia is present.    Well healed incision/scar to the LEFT of umbilicus; +small mildly tender hernia adjacent to scar.    Musculoskeletal:       Right shoulder: Normal. He exhibits normal range of motion, no tenderness, no bony tenderness, no pain and no spasm.       Cervical back: Normal. He exhibits normal range of motion, no tenderness, no bony tenderness, no swelling, no pain and no spasm.  Lymphadenopathy:    He has no cervical adenopathy.  Neurological: He is alert and oriented to person, place, and time. No cranial nerve deficit.  Skin: Skin is warm and dry. No rash noted. He is not diaphoretic.  Psychiatric: He has a normal mood and affect. His behavior is normal.  Nursing note and vitals reviewed.  No results found. Depression screen [facility] 2/9 03/30/2017 11/07/2016 10/20/2016 06/05/2016 02/19/2016  Decreased Interest 0 0 0 0 0  Down, Depressed, Hopeless 0 0 0 0 0    PHQ - 2 Score 0 0 0 0 0   Fall Risk  03/30/2017 11/07/2016 10/20/2016 06/05/2016 02/19/2016  Falls in the past year? No No No No No        Assessment & Plan:   1. Incisional hernia, without obstruction or gangrene   2. Acute non-recurrent maxillary sinusitis   3. Gastroesophageal reflux disease without esophagitis   4. Essential hypertension   5. Crohn's disease of small intestine with other complication ([place])   6. Seasonal allergic rhinitis due to pollen   7. Tobacco abuse   8. Sprain of right rotator cuff capsule, initial encounter    -New onset incisional hernia; refer to general surgery for consultation; to ED for acute pain. -new onset maxillary sinusitis in a smoker and also in patient maintained on Humira for Crohn's disease; treat with Augmentin.  Continue Flonase. -GERD and hypertension well controlled; obtain labs.  No change in therapy. -New onset R shoulder sprain with improvement; recommend stretching and rest and icing. -Crohn's disease well-controlled with Humira; managed by Dr. Benson Norway. -encourage smoking cessation.   Orders Placed This Encounter  Procedures  . CBC with Differential/Platelet  . Comprehensive metabolic panel  . TSH  . Ambulatory referral to General Surgery    Referral Priority:   Routine    Referral Type:   Surgical    Referral Reason:   Specialty Services Required    Requested Specialty:   General Surgery    Number of Visits Requested:   1   Meds ordered this encounter  Medications  . DISCONTD: amoxicillin-clavulanate (AUGMENTIN) 875-125 MG tablet    Sig: Take 1 tablet by mouth 2 (two) times daily.    Dispense:  20 tablet    Refill:  0  . DISCONTD: ondansetron (ZOFRAN-ODT) 8 MG disintegrating tablet    Sig: Take 1 tablet (8 mg total) by mouth every 8 (eight) hours as needed for nausea.    Dispense:  60 tablet    Refill:  3    Return in about 6 months (around 09/28/2017) for complete physical examiniation.   Heidemarie Goodnow Elayne Guerin,  M.D. Primary Care at [facility] [address] [place], [place]  [address] [phone] phone [phone] fax

## 2017-03-31 DIAGNOSIS — Z720 Tobacco use: Secondary | ICD-10-CM | POA: Insufficient documentation

## 2017-04-06 ENCOUNTER — Telehealth: Payer: Self-pay | Admitting: Family Medicine

## 2017-04-06 NOTE — Telephone Encounter (Signed)
Authorization received for pt referral to Dr. Zella Richer at [facility]. The auth number is [id number] and is valid from 04/06/2017 - 10/03/2017 for 6 months or 6 visits.

## 2017-05-02 ENCOUNTER — Other Ambulatory Visit: Payer: Self-pay | Admitting: Physician Assistant

## 2017-05-04 ENCOUNTER — Ambulatory Visit: Payer: Self-pay | Admitting: General Surgery

## 2017-05-04 NOTE — H&P (Signed)
History of Present Illness Ralene Ok MD; 05/04/2017 2:09 PM) The patient is a 36 year old male who presents with an incisional hernia. Ileal resection as well as small bowel/colon resection in January of this year for Crohn's flare. Patient is currently on Humira for Crohn's. Patient states that subsequently after his most recent surgery he noticed a bulge in the upper midline portion of his incision. Patient states he has no pain currently. 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 Patient smokes approximately 1 pack a day.  Patient is currently taking Humira. Patient sees Dr. Benson Norway as his GI physician  Past Medical History:  Diagnosis Date  . Allergy   . Crohn's disease ([place])   . GERD (gastroesophageal reflux disease)   . Heroin addiction ([place]) 06/23/2009   Remission since 2011.  Marland Kitchen Hypertension   . Pancreatitis   . Renal disorder    Nephrolithiasis    Past Surgical History:  Procedure Laterality Date  . APPENDECTOMY  2000  . BOWEL RESECTION  2001  . CHOLECYSTECTOMY  2005  . COLON SURGERY  2000  . COLONOSCOPY  2016   multiple, last was in 2016  . TONSILLECTOMY         Allergies (Tanisha A. Owens Shark, [place]; 05/04/2017 1:49 PM) PredniSONE (Pak) *CORTICOSTEROIDS*  Anaphylaxis. Vicodin *ANALGESICS - OPIOID*  Hives, Itching. Morphine Sulfate (Concentrate) *ANALGESICS - OPIOID*  Hives. Allergies Reconciled   Medication History (Tanisha A. Owens Shark, [place]; 05/04/2017 1:49 PM) Humira (20MG/0.4ML Kit, Subcutaneous daily) Active. CeleBREX (200MG Capsule, Oral daily) Active. Bentyl (20MG Tablet, Oral daily) Active. Zofran (8MG Tablet, Oral as needed) Active. Oxycodone-Acetaminophen (5-325MG Tablet, Oral as needed) Active. Protonix (40MG Tablet DR, Oral daily) Active. Mirapex (0.125MG Tablet, Oral daily) Active. Zantac (150MG Tablet, Oral daily) Active. Chantix Starting Month Pak (0.5 MG X 11 &1 MG X 42 Tablet, Oral daily)  Active. Medications Reconciled    Review of Systems Ralene Ok, MD; 05/04/2017 2:11 PM) All other systems negative  Vitals (Tanisha A. Brown RMA; 05/04/2017 1:49 PM) 05/04/2017 1:49 PM Weight: 190.2 lb Height: 72in Body Surface Area: 2.09 m Body Mass Index: 25.8 kg/m  Temp.: 97.53F  Pulse: 103 (Regular)  BP: 120/84 (Sitting, Left Arm, Standard)       Physical Exam Ralene Ok MD; 05/04/2017 2:10 PM) The physical exam findings are as follows: Note:Constitutional: No acute distress, conversant, appears stated age  Eyes: Anicteric sclerae, moist conjunctiva, no lid lag  Neck: No thyromegaly, trachea midline, no cervical lymphadenopathy  Lungs: Clear to auscultation biilaterally, normal respiratory effot  Cardiovascular: regular rate & rhythm, no murmurs, no peripheal edema, pedal pulses 2+  GI: Soft, no masses or hepatosplenomegaly, non-tender to palpation  MSK: Normal gait, no clubbing cyanosis, edema  Skin: No rashes, palpation reveals normal skin turgor  Psychiatric: Appropriate judgment and insight, oriented to person, place, and time  Abdomen Inspection Hernias - Incisional - Reducible(Upper midline incisional hernia, likely Swiss cheese defect, well-healed incision).    Assessment & Plan Ralene Ok MD; 05/04/2017 2:11 PM) INCISIONAL HERNIA, WITHOUT OBSTRUCTION OR GANGRENE (K43.2) Impression: 36 year old male with a incisional hernia, history of Crohn's, Humira  1. The patient would like to proceed to the operating room for open, retrorectus hernia repair with mesh. 2. I encouraged patient to stop smoking today. I discussed with him that he would likely take his last Humira shot this weekend. This would allow him to be approximately 4 weeks from surgery and off His Humira. 3. I discussed  with him the risks and benefits of the procedure to include but not limited to: Infection, bleeding, damage to structures, and possible  recurrence. The patient was understanding and wishes to proceed.

## 2017-06-05 ENCOUNTER — Inpatient Hospital Stay (HOSPITAL_COMMUNITY)
Admission: RE | Admit: 2017-06-05 | Discharge: 2017-06-05 | Disposition: A | Discharge status: Home / Self Care | Payer: 59 | Source: Ambulatory Visit

## 2017-06-05 NOTE — Pre-Procedure Instructions (Signed)
Leroy Herrera  06/05/2017      [facility] [place], [place] - [address] [place] [place] [address] Phone: [phone] Fax: [phone]    Your procedure is scheduled on Wednesday 06/09/2017.  Report to [facility] Admitting at 0830 A.M.  Call this number if you have problems the morning of surgery:  [phone]   Remember:   Continue all medications as directed by your physician except follow these medication instructions before surgery below   Do not eat food or drink liquids after midnight.   Take these medicines the morning of surgery with A SIP OF WATER: Dexilant Dicyclomine (Bentyl) - if needed Fluticasone (Flonase) - if needed Ranitidine (Zantac)  7 days prior to surgery STOP taking any Aspirin(unless otherwise instructed by your surgeon), Aleve, Naproxen, Ibuprofen, Motrin, Advil, Goody's, BC's, all herbal medications, fish oil, and all vitamins    Do not wear jewelry.  Do not wear lotions, powders, or colognes, or deodorant.  Men may shave face and neck.  Do not bring valuables to the hospital.  [facility] is not responsible for any belongings or valuables.  Contacts, eyeglasses, dentures or bridgework or hearing aids may not be worn into surgery.  Leave your suitcase in the car.  After surgery it may be brought to your room.  For patients admitted to the hospital, discharge time will be determined by your treatment team.  Patients discharged the day of surgery will not be allowed to drive home.   Name and phone number of your driver:    Special instructions:   [facility]- Preparing For Surgery  Before surgery, you can play an important role. Because skin is not sterile, your skin needs to be as free of germs as possible. You can reduce the number of germs on your skin by washing with CHG (chlorahexidine gluconate) Soap before surgery.  CHG is an antiseptic cleaner which kills germs and bonds  with the skin to continue killing germs even after washing.  Please do not use if you have an allergy to CHG or antibacterial soaps. If your skin becomes reddened/irritated stop using the CHG.  Do not shave (including legs and underarms) for at least 48 hours prior to first CHG shower. It is OK to shave your face.  Please follow these instructions carefully.   1. Shower the NIGHT BEFORE SURGERY and the MORNING OF SURGERY with CHG.   2. If you chose to wash your hair, wash your hair first as usual with your normal shampoo.  3. After you shampoo, rinse your hair and body thoroughly to remove the shampoo.  4. Use CHG as you would any other liquid soap. You can apply CHG directly to the skin and wash gently with a scrungie or a clean washcloth.   5. Apply the CHG Soap to your body ONLY FROM THE NECK DOWN.  Do not use on open wounds or open sores. Avoid contact with your eyes, ears, mouth and genitals (private parts). Wash Face and genitals (private parts)  with your normal soap.  6. Wash thoroughly, paying special attention to the area where your surgery will be performed.  7. Thoroughly rinse your body with warm water from the neck down.  8. DO NOT shower/wash with your normal soap after using and rinsing off the CHG Soap.  9. Pat yourself dry with a CLEAN TOWEL.  10. Wear CLEAN PAJAMAS to bed the night before surgery, wear comfortable clothes  the morning of surgery  11. Place CLEAN SHEETS on your bed the night of your first shower and DO NOT SLEEP WITH PETS.    Day of Surgery: Shower as stated above. Do not apply any deodorants/lotions.  Please wear clean clothes to the hospital/surgery center.      Please read over the following fact sheets that you were given.

## 2017-06-08 ENCOUNTER — Encounter (HOSPITAL_COMMUNITY)
Admission: RE | Admit: 2017-06-08 | Discharge: 2017-06-08 | Disposition: A | Discharge status: Home / Self Care | Payer: 59 | Source: Ambulatory Visit | Attending: General Surgery | Admitting: General Surgery

## 2017-06-08 ENCOUNTER — Encounter (HOSPITAL_COMMUNITY): Payer: Self-pay

## 2017-06-08 DIAGNOSIS — Z01810 Encounter for preprocedural cardiovascular examination: Secondary | ICD-10-CM

## 2017-06-08 DIAGNOSIS — K432 Incisional hernia without obstruction or gangrene: Secondary | ICD-10-CM | POA: Insufficient documentation

## 2017-06-08 DIAGNOSIS — Z01812 Encounter for preprocedural laboratory examination: Secondary | ICD-10-CM | POA: Insufficient documentation

## 2017-06-08 DIAGNOSIS — I10 Essential (primary) hypertension: Secondary | ICD-10-CM | POA: Insufficient documentation

## 2017-06-08 HISTORY — DX: Crohn's disease of large intestine without complications: K50.10

## 2017-06-08 LAB — CBC
HEMATOCRIT: 42.1 % (ref 39.0–52.0)
HEMOGLOBIN: 14.6 g/dL (ref 13.0–17.0)
MCH: 29.4 pg (ref 26.0–34.0)
MCHC: 34.7 g/dL (ref 30.0–36.0)
MCV: 84.7 fL (ref 78.0–100.0)
Platelets: 231 10*3/uL (ref 150–400)
RBC: 4.97 MIL/uL (ref 4.22–5.81)
RDW: 12.2 % (ref 11.5–15.5)
WBC: 11.7 10*3/uL — ABNORMAL HIGH (ref 4.0–10.5)

## 2017-06-08 LAB — BASIC METABOLIC PANEL
Anion gap: 9 (ref 5–15)
BUN: 13 mg/dL (ref 6–20)
CHLORIDE: 102 mmol/L (ref 101–111)
CO2: 26 mmol/L (ref 22–32)
CREATININE: 1.11 mg/dL (ref 0.61–1.24)
Calcium: 9.4 mg/dL (ref 8.9–10.3)
GFR calc Af Amer: >60 mL/min (ref >60)
GFR calc non Af Amer: >60 mL/min (ref >60)
Glucose, Bld: 88 mg/dL (ref 65–99)
POTASSIUM: 3.5 mmol/L (ref 3.5–5.1)
Sodium: 137 mmol/L (ref 135–145)

## 2017-06-08 MED ORDER — CHLORHEXIDINE GLUCONATE CLOTH 2 % EX PADS: 6.0000 | MEDICATED_PAD | Freq: Once | CUTANEOUS | Status: DC

## 2017-06-10 ENCOUNTER — Encounter (HOSPITAL_COMMUNITY)
Admission: RE | Disposition: A | Discharge status: Home / Self Care | Payer: Self-pay | Source: Ambulatory Visit | Attending: General Surgery

## 2017-06-10 ENCOUNTER — Inpatient Hospital Stay (HOSPITAL_COMMUNITY)
Admission: RE | Admit: 2017-06-10 | Discharge: 2017-06-12 | DRG: 354 | Disposition: A | Discharge status: Home / Self Care | Payer: 59 | Source: Ambulatory Visit | Attending: General Surgery | Admitting: General Surgery

## 2017-06-10 ENCOUNTER — Other Ambulatory Visit: Payer: Self-pay

## 2017-06-10 ENCOUNTER — Encounter (HOSPITAL_COMMUNITY): Payer: Self-pay

## 2017-06-10 ENCOUNTER — Inpatient Hospital Stay (HOSPITAL_COMMUNITY): Payer: 59 | Admitting: Certified Registered Nurse Anesthetist

## 2017-06-10 DIAGNOSIS — Z87442 Personal history of urinary calculi: Secondary | ICD-10-CM | POA: Diagnosis not present

## 2017-06-10 DIAGNOSIS — Z888 Allergy status to other drugs, medicaments and biological substances status: Secondary | ICD-10-CM

## 2017-06-10 DIAGNOSIS — F1121 Opioid dependence, in remission: Secondary | ICD-10-CM | POA: Diagnosis present

## 2017-06-10 DIAGNOSIS — K660 Peritoneal adhesions (postprocedural) (postinfection): Secondary | ICD-10-CM | POA: Diagnosis present

## 2017-06-10 DIAGNOSIS — Z8719 Personal history of other diseases of the digestive system: Secondary | ICD-10-CM

## 2017-06-10 DIAGNOSIS — Z885 Allergy status to narcotic agent status: Secondary | ICD-10-CM

## 2017-06-10 DIAGNOSIS — Z716 Tobacco abuse counseling: Secondary | ICD-10-CM

## 2017-06-10 DIAGNOSIS — K5090 Crohn's disease, unspecified, without complications: Secondary | ICD-10-CM | POA: Diagnosis not present

## 2017-06-10 DIAGNOSIS — Z9049 Acquired absence of other specified parts of digestive tract: Secondary | ICD-10-CM

## 2017-06-10 DIAGNOSIS — K432 Incisional hernia without obstruction or gangrene: Secondary | ICD-10-CM | POA: Diagnosis not present

## 2017-06-10 DIAGNOSIS — K219 Gastro-esophageal reflux disease without esophagitis: Secondary | ICD-10-CM | POA: Diagnosis present

## 2017-06-10 DIAGNOSIS — I10 Essential (primary) hypertension: Secondary | ICD-10-CM | POA: Diagnosis present

## 2017-06-10 DIAGNOSIS — Z79899 Other long term (current) drug therapy: Secondary | ICD-10-CM

## 2017-06-10 DIAGNOSIS — F17210 Nicotine dependence, cigarettes, uncomplicated: Secondary | ICD-10-CM | POA: Diagnosis present

## 2017-06-10 DIAGNOSIS — N289 Disorder of kidney and ureter, unspecified: Secondary | ICD-10-CM | POA: Diagnosis present

## 2017-06-10 DIAGNOSIS — Z9889 Other specified postprocedural states: Secondary | ICD-10-CM

## 2017-06-10 HISTORY — PX: INCISIONAL HERNIA REPAIR: SHX193

## 2017-06-10 HISTORY — PX: INSERTION OF MESH: SHX5868

## 2017-06-10 HISTORY — DX: Personal history of urinary calculi: Z87.442

## 2017-06-10 HISTORY — DX: Unspecified osteoarthritis, unspecified site: M19.90

## 2017-06-10 HISTORY — DX: Other seasonal allergic rhinitis: J30.2

## 2017-06-10 SURGERY — REPAIR, HERNIA, INCISIONAL
Anesthesia: General

## 2017-06-10 MED ORDER — HYDROMORPHONE HCL 1 MG/ML IJ SOLN
0.2500 mg | INTRAMUSCULAR | Status: DC | PRN
  Administered 2017-06-10 (×2): 0.5 mg via INTRAVENOUS

## 2017-06-10 MED ORDER — LISINOPRIL 20 MG PO TABS
20.0000 mg | ORAL_TABLET | Freq: Every evening | ORAL | Status: DC
  Administered 2017-06-10 – 2017-06-11 (×2): 20 mg via ORAL
  Filled 2017-06-10 (×2): qty 1

## 2017-06-10 MED ORDER — DEXTROSE-NACL 5-0.9 % IV SOLN
INTRAVENOUS | Status: DC
  Administered 2017-06-10 – 2017-06-11 (×3): via INTRAVENOUS

## 2017-06-10 MED ORDER — ACETAMINOPHEN 500 MG PO TABS
1000.0000 mg | ORAL_TABLET | ORAL | Status: AC
  Administered 2017-06-10: 1000 mg via ORAL
  Filled 2017-06-10: qty 2

## 2017-06-10 MED ORDER — ONDANSETRON HCL 4 MG/2ML IJ SOLN: 4.0000 mg | Freq: Four times a day (QID) | INTRAMUSCULAR | Status: DC | PRN

## 2017-06-10 MED ORDER — EPHEDRINE SULFATE-NACL 50-0.9 MG/10ML-% IV SOSY
PREFILLED_SYRINGE | INTRAVENOUS | Status: DC | PRN
  Administered 2017-06-10 (×2): 5 mg via INTRAVENOUS

## 2017-06-10 MED ORDER — PANTOPRAZOLE SODIUM 40 MG PO TBEC
40.0000 mg | DELAYED_RELEASE_TABLET | Freq: Every day | ORAL | Status: DC
Start: 2017-06-11 — End: 2017-06-12
  Administered 2017-06-11 – 2017-06-12 (×2): 40 mg via ORAL
  Filled 2017-06-10 (×2): qty 1

## 2017-06-10 MED ORDER — EPHEDRINE 5 MG/ML INJ
INTRAVENOUS | Status: AC
  Filled 2017-06-10: qty 10

## 2017-06-10 MED ORDER — LISINOPRIL-HYDROCHLOROTHIAZIDE 20-25 MG PO TABS: 1.0000 | ORAL_TABLET | Freq: Every evening | ORAL | Status: DC

## 2017-06-10 MED ORDER — HYDROMORPHONE HCL 1 MG/ML IJ SOLN
INTRAMUSCULAR | Status: AC
  Administered 2017-06-11: 1 mg
  Filled 2017-06-10: qty 1

## 2017-06-10 MED ORDER — GABAPENTIN 300 MG PO CAPS
300.0000 mg | ORAL_CAPSULE | ORAL | Status: AC
  Administered 2017-06-10: 300 mg via ORAL
  Filled 2017-06-10: qty 1

## 2017-06-10 MED ORDER — PHENYLEPHRINE 40 MCG/ML (10ML) SYRINGE FOR IV PUSH (FOR BLOOD PRESSURE SUPPORT)
PREFILLED_SYRINGE | INTRAVENOUS | Status: AC
  Filled 2017-06-10: qty 10

## 2017-06-10 MED ORDER — MIDAZOLAM HCL 5 MG/5ML IJ SOLN
INTRAMUSCULAR | Status: DC | PRN
  Administered 2017-06-10: 1 mg via INTRAVENOUS
  Administered 2017-06-10: 2 mg via INTRAVENOUS

## 2017-06-10 MED ORDER — HYDROCHLOROTHIAZIDE 25 MG PO TABS
25.0000 mg | ORAL_TABLET | Freq: Every evening | ORAL | Status: DC
  Administered 2017-06-10 – 2017-06-11 (×2): 25 mg via ORAL
  Filled 2017-06-10 (×2): qty 1

## 2017-06-10 MED ORDER — CELECOXIB 200 MG PO CAPS
200.0000 mg | ORAL_CAPSULE | Freq: Two times a day (BID) | ORAL | Status: DC
  Administered 2017-06-10 – 2017-06-12 (×5): 200 mg via ORAL
  Filled 2017-06-10 (×5): qty 1

## 2017-06-10 MED ORDER — PROPOFOL 10 MG/ML IV BOLUS
INTRAVENOUS | Status: DC | PRN
  Administered 2017-06-10: 140 mg via INTRAVENOUS

## 2017-06-10 MED ORDER — LACTATED RINGERS IV SOLN
INTRAVENOUS | Status: DC
  Administered 2017-06-10 (×3): via INTRAVENOUS

## 2017-06-10 MED ORDER — MIDAZOLAM HCL 2 MG/2ML IJ SOLN
INTRAMUSCULAR | Status: AC
  Filled 2017-06-10: qty 2

## 2017-06-10 MED ORDER — ONDANSETRON HCL 4 MG/2ML IJ SOLN
INTRAMUSCULAR | Status: DC | PRN
  Administered 2017-06-10: 4 mg via INTRAVENOUS

## 2017-06-10 MED ORDER — FAMOTIDINE 20 MG PO TABS
20.0000 mg | ORAL_TABLET | Freq: Every day | ORAL | Status: DC
Start: 2017-06-10 — End: 2017-06-12
  Administered 2017-06-10 – 2017-06-11 (×2): 20 mg via ORAL
  Filled 2017-06-10 (×2): qty 1

## 2017-06-10 MED ORDER — PROPOFOL 10 MG/ML IV BOLUS
INTRAVENOUS | Status: AC
  Filled 2017-06-10: qty 20

## 2017-06-10 MED ORDER — ROCURONIUM BROMIDE 10 MG/ML (PF) SYRINGE
PREFILLED_SYRINGE | INTRAVENOUS | Status: AC
  Filled 2017-06-10: qty 5

## 2017-06-10 MED ORDER — SUGAMMADEX SODIUM 200 MG/2ML IV SOLN
INTRAVENOUS | Status: DC | PRN
  Administered 2017-06-10: 200 mg via INTRAVENOUS

## 2017-06-10 MED ORDER — FENTANYL CITRATE (PF) 250 MCG/5ML IJ SOLN
INTRAMUSCULAR | Status: AC
  Filled 2017-06-10: qty 5

## 2017-06-10 MED ORDER — ROCURONIUM BROMIDE 100 MG/10ML IV SOLN
INTRAVENOUS | Status: DC | PRN
  Administered 2017-06-10: 15 mg via INTRAVENOUS
  Administered 2017-06-10: 20 mg via INTRAVENOUS
  Administered 2017-06-10: 10 mg via INTRAVENOUS
  Administered 2017-06-10: 50 mg via INTRAVENOUS

## 2017-06-10 MED ORDER — ACETAMINOPHEN 10 MG/ML IV SOLN
1000.0000 mg | Freq: Four times a day (QID) | INTRAVENOUS | Status: AC
  Administered 2017-06-10 – 2017-06-11 (×3): 1000 mg via INTRAVENOUS
  Filled 2017-06-10 (×3): qty 100

## 2017-06-10 MED ORDER — CEFAZOLIN SODIUM-DEXTROSE 2-4 GM/100ML-% IV SOLN
2.0000 g | INTRAVENOUS | Status: AC
  Administered 2017-06-10: 2 g via INTRAVENOUS
  Filled 2017-06-10: qty 100

## 2017-06-10 MED ORDER — ONDANSETRON 4 MG PO TBDP: 4.0000 mg | ORAL_TABLET | Freq: Four times a day (QID) | ORAL | Status: DC | PRN

## 2017-06-10 MED ORDER — DIPHENHYDRAMINE HCL 50 MG/ML IJ SOLN: 12.5000 mg | Freq: Four times a day (QID) | INTRAMUSCULAR | Status: DC | PRN

## 2017-06-10 MED ORDER — LIDOCAINE 2% (20 MG/ML) 5 ML SYRINGE
INTRAMUSCULAR | Status: AC
  Filled 2017-06-10: qty 5

## 2017-06-10 MED ORDER — DEXAMETHASONE SODIUM PHOSPHATE 10 MG/ML IJ SOLN
INTRAMUSCULAR | Status: DC | PRN
  Administered 2017-06-10: 10 mg via INTRAVENOUS

## 2017-06-10 MED ORDER — FENTANYL CITRATE (PF) 250 MCG/5ML IJ SOLN
INTRAMUSCULAR | Status: AC
Start: 2017-06-10 — End: 2017-06-10
  Filled 2017-06-10: qty 5

## 2017-06-10 MED ORDER — LACTATED RINGERS IV SOLN: INTRAVENOUS | Status: DC

## 2017-06-10 MED ORDER — PROMETHAZINE HCL 25 MG/ML IJ SOLN: 6.2500 mg | INTRAMUSCULAR | Status: DC | PRN

## 2017-06-10 MED ORDER — 0.9 % SODIUM CHLORIDE (POUR BTL) OPTIME
TOPICAL | Status: DC | PRN
  Administered 2017-06-10: 1000 mL

## 2017-06-10 MED ORDER — SUGAMMADEX SODIUM 200 MG/2ML IV SOLN
INTRAVENOUS | Status: AC
  Filled 2017-06-10: qty 2

## 2017-06-10 MED ORDER — TISSEEL VH 4 ML EX KIT
PACK | CUTANEOUS | Status: DC | PRN
  Administered 2017-06-10: 1

## 2017-06-10 MED ORDER — LIDOCAINE HCL (CARDIAC) 20 MG/ML IV SOLN
INTRAVENOUS | Status: DC | PRN
  Administered 2017-06-10: 60 mg via INTRAVENOUS

## 2017-06-10 MED ORDER — MEPERIDINE HCL 25 MG/ML IJ SOLN: 6.2500 mg | INTRAMUSCULAR | Status: DC | PRN

## 2017-06-10 MED ORDER — CELECOXIB 200 MG PO CAPS
200.0000 mg | ORAL_CAPSULE | ORAL | Status: AC
  Administered 2017-06-10: 200 mg via ORAL
  Filled 2017-06-10: qty 1

## 2017-06-10 MED ORDER — PHENYLEPHRINE HCL 10 MG/ML IJ SOLN
INTRAMUSCULAR | Status: DC | PRN
  Administered 2017-06-10: 20 ug/min via INTRAVENOUS

## 2017-06-10 MED ORDER — EVICEL 5 ML EX KIT
PACK | CUTANEOUS | Status: AC
  Filled 2017-06-10: qty 1

## 2017-06-10 MED ORDER — HYDROMORPHONE HCL 1 MG/ML IJ SOLN
1.0000 mg | INTRAMUSCULAR | Status: DC | PRN
  Administered 2017-06-10 – 2017-06-12 (×8): 1 mg via INTRAVENOUS
  Filled 2017-06-10 (×8): qty 1

## 2017-06-10 MED ORDER — GABAPENTIN 300 MG PO CAPS
300.0000 mg | ORAL_CAPSULE | Freq: Two times a day (BID) | ORAL | Status: DC
  Administered 2017-06-10 – 2017-06-12 (×5): 300 mg via ORAL
  Filled 2017-06-10 (×5): qty 1

## 2017-06-10 MED ORDER — FENTANYL CITRATE (PF) 100 MCG/2ML IJ SOLN
INTRAMUSCULAR | Status: DC | PRN
  Administered 2017-06-10 (×8): 50 ug via INTRAVENOUS

## 2017-06-10 MED ORDER — ENOXAPARIN SODIUM 30 MG/0.3ML ~~LOC~~ SOLN
30.0000 mg | SUBCUTANEOUS | Status: DC
  Filled 2017-06-10 (×2): qty 0.3

## 2017-06-10 MED ORDER — ONDANSETRON HCL 4 MG/2ML IJ SOLN
INTRAMUSCULAR | Status: AC
  Filled 2017-06-10: qty 2

## 2017-06-10 SURGICAL SUPPLY — 40 items
BINDER ABDOMINAL 12 ML 46-62 (SOFTGOODS) ×2 IMPLANT
BLADE CLIPPER SURG (BLADE) IMPLANT
COVER SURGICAL LIGHT HANDLE (MISCELLANEOUS) ×2 IMPLANT
DEVICE TROCAR PUNCTURE CLOSURE (ENDOMECHANICALS) ×2 IMPLANT
DRAIN CHANNEL 19F RND (DRAIN) IMPLANT
DRAPE INCISE IOBAN 66X45 STRL (DRAPES) ×2 IMPLANT
DRAPE LAPAROSCOPIC ABDOMINAL (DRAPES) ×2 IMPLANT
ELECT CAUTERY BLADE 6.4 (BLADE) ×2 IMPLANT
ELECT REM PT RETURN 9FT ADLT (ELECTROSURGICAL) ×2
ELECTRODE REM PT RTRN 9FT ADLT (ELECTROSURGICAL) ×1 IMPLANT
EVACUATOR SILICONE 100CC (DRAIN) IMPLANT
GAUZE SPONGE 4X4 12PLY STRL (GAUZE/BANDAGES/DRESSINGS) IMPLANT
GLOVE BIO SURGEON STRL SZ7.5 (GLOVE) ×4 IMPLANT
GLOVE BIOGEL PI IND STRL 7.5 (GLOVE) ×1 IMPLANT
GLOVE BIOGEL PI IND STRL 8 (GLOVE) ×1 IMPLANT
GLOVE BIOGEL PI INDICATOR 7.5 (GLOVE) ×1
GLOVE BIOGEL PI INDICATOR 8 (GLOVE) ×1
GOWN STRL REUS W/ TWL LRG LVL3 (GOWN DISPOSABLE) ×1 IMPLANT
GOWN STRL REUS W/ TWL XL LVL3 (GOWN DISPOSABLE) ×1 IMPLANT
GOWN STRL REUS W/TWL LRG LVL3 (GOWN DISPOSABLE) ×1
GOWN STRL REUS W/TWL XL LVL3 (GOWN DISPOSABLE) ×1
KIT BASIN OR (CUSTOM PROCEDURE TRAY) ×2 IMPLANT
KIT ROOM TURNOVER OR (KITS) ×2 IMPLANT
MARKER PEN SURG W/LABELS BLK (STERILIZATION PRODUCTS) ×2 IMPLANT
MESH HERNIA 6X6 BARD (Mesh General) ×1 IMPLANT
MESH HERNIA BARD 6X6 (Mesh General) ×1 IMPLANT
NS IRRIG 1000ML POUR BTL (IV SOLUTION) ×2 IMPLANT
PACK GENERAL/GYN (CUSTOM PROCEDURE TRAY) ×2 IMPLANT
PAD ARMBOARD 7.5X6 YLW CONV (MISCELLANEOUS) ×4 IMPLANT
RETAINER VISCERA MED (MISCELLANEOUS) ×2 IMPLANT
STAPLER VISISTAT 35W (STAPLE) IMPLANT
SUT ETHILON 3 0 FSL (SUTURE) IMPLANT
SUT MNCRL AB 4-0 PS2 18 (SUTURE) ×4 IMPLANT
SUT NOVA NAB GS-21 0 18 T12 DT (SUTURE) ×4 IMPLANT
SUT PDS AB 0 CT 36 (SUTURE) ×8 IMPLANT
SUT VIC AB 3-0 SH 18 (SUTURE) ×2 IMPLANT
SUT VICRYL AB 2 0 TIES (SUTURE) ×2 IMPLANT
TOWEL OR 17X24 6PK STRL BLUE (TOWEL DISPOSABLE) ×2 IMPLANT
TOWEL OR 17X26 10 PK STRL BLUE (TOWEL DISPOSABLE) ×2 IMPLANT
TRAY FOLEY CATH SILVER 16FR (SET/KITS/TRAYS/PACK) ×2 IMPLANT

## 2017-06-10 NOTE — Anesthesia Procedure Notes (Signed)
Procedure Name: Intubation Date/Time: 06/10/2017 12:10 PM Performed by: Inda Coke, CRNA Pre-anesthesia Checklist: Patient identified, Emergency Drugs available, Suction available and Patient being monitored Patient Re-evaluated:Patient Re-evaluated prior to induction Oxygen Delivery Method: Circle System Utilized Preoxygenation: Pre-oxygenation with 100% oxygen Induction Type: IV induction Ventilation: Mask ventilation without difficulty Laryngoscope Size: Mac and 4 Grade View: Grade II Tube type: Oral Number of attempts: 1 Airway Equipment and Method: Stylet and Oral airway Placement Confirmation: ETT inserted through vocal cords under direct vision,  positive ETCO2 and breath sounds checked- equal and bilateral Secured at: 22 cm Tube secured with: Tape Dental Injury: Teeth and Oropharynx as per pre-operative assessment

## 2017-06-10 NOTE — Op Note (Signed)
06/10/2017  1:40 PM  PATIENT:  Leroy Herrera  36 y.o. male  PRE-OPERATIVE DIAGNOSIS:  incisional hernia  POST-OPERATIVE DIAGNOSIS:  incisional hernia  PROCEDURE:  Procedure(s): OPEN REPAIR REPAIR INCISIONAL HERNIA RETRORECTUS REPAIR (N/A) INSERTION OF MESH (N/A)  SURGEON:  Surgeon(s) and Role:    Ralene Ok, MD - Primary    ASSISTANTS: Algis Greenhouse, RNFA   ANESTHESIA:   local and general  EBL:  25 mL   BLOOD ADMINISTERED:none  DRAINS: none   LOCAL MEDICATIONS USED:  NONE  SPECIMEN:  No Specimen  DISPOSITION OF SPECIMEN:  N/A  COUNTS:  YES  TOURNIQUET:  * No tourniquets in log *  DICTATION: .Dragon Dictation  After the patient was consented he was taken back to the operating room and placed in the supine position with bilateral SCDs in place. The patient was prepped and draped in usual sterile fashion. Antibiotics were confirmed and timeout was called and all facts verified.  A #10 blade was used to make a midline incision around the previous incision.  This was entirely removed with cautery.  I proceeded to use electrocautery to maintain hemostasis and dissection took place in the most superior portion of the wound down to the subcutaneous tissues fat to the midline fascia. This was incised. The fascia was elevated and cautery was used to help with hemostasis.  The peritoneum was bluntly entered. There appeared to be some omentum in the hernia sac in the midline.  There was no bowel within the hernia sac.  Inferior to this there was some minimal adhesions of the omentum to the anterior abdominal wall.  This was taken down both bluntly and with cautery. The fascia was incised to the rest of incision. The hernia consisted of weak scar tissue in the midline and there were several small swiss cheese defects.  There were minimal interior adhesions.   At this time I proceeded to incise the posterior rectus fascia.  The rectus muscle was then dissected away from the  posterior rectus fascia.  This was done up to the perforators and did not go past them.  This was done individually on each side.  An area the preperitoneal space inferiorly connected each side.  A area superiorly was connected via the midline linea alba and the preperitoneal fat and the falciform ligament.  This easily allowed the posterior rectus sheath to be advanced bilaterally to the midline.  There was a small violation to the posterior rectus sheath on the right.  This was reapproximated with figure of eight stitches, using Vicryls.  The posterior midline fascia was then reapproximated with a 0 PDS in a standard running fashion. The area was irrigated out.  Hemostasis was excellent.  At this time a piece of Bard mesh, 15 x 15 cm was cut to equal a 10 cm lateral margin.  The mesh was turned into a diamond fashion.  Mesh was placed into the retrorectus space. #0 Novafils were used in interrupted fashion with an endoclose decive as transfascial sutures x 2 bilaterally, one superiorly and one inferiorly.  This allowed the mesh to be taught.  This area was irrigated out.  Eviseal fibrin glue was used to secure the mesh medially over the midline fascia.  #1 PDS  Was used in a standard running fashion. The anterior midline fascia was approximated with undue tension. The subcutaneous layer was then irrigated out with sterile saline.  At this time the skin was reapproximated using a 4-0 Monocryl in a subcuticular  fashion.  The skin was sealed with Dermabond.  An abdominal binder was placed.  The patient tolerated the procedure well was taken to the recovery room with an abdominal binder in stable condition.   PLAN OF CARE: Admit to inpatient   PATIENT DISPOSITION:  PACU - hemodynamically stable.   Delay start of Pharmacological VTE agent (>24hrs) due to surgical blood loss or risk of bleeding: yes

## 2017-06-10 NOTE — H&P (Signed)
History of Present Illness  The patient is a 36 year old male who presents with an incisional hernia. Ileal resection as well as small bowel/colon resection in January of this year for Crohn's flare. Patient is currently on Humira for Crohn's. Patient states that subsequently after his most recent surgery he noticed a bulge in the upper midline portion of his incision. Patient states he has no pain currently. 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 Patient smokes approximately 1 pack a day.  Patient is currently taking Humira. Patient sees Dr. Benson Norway as his GI physician      Past Medical History:  Diagnosis Date  . Allergy   . Crohn's disease ([place])   . GERD (gastroesophageal reflux disease)   . Heroin addiction ([place]) 06/23/2009   Remission since 2011.  Marland Kitchen Hypertension   . Pancreatitis   . Renal disorder    Nephrolithiasis         Past Surgical History:  Procedure Laterality Date  . APPENDECTOMY  2000  . BOWEL RESECTION  2001  . CHOLECYSTECTOMY  2005  . COLON SURGERY  2000  . COLONOSCOPY  2016   multiple, last was in 2016  . TONSILLECTOMY         Allergies PredniSONE (Pak) *CORTICOSTEROIDS*  Anaphylaxis. Vicodin *ANALGESICS - OPIOID*  Hives, Itching. Morphine Sulfate (Concentrate) *ANALGESICS - OPIOID*  Hives. Allergies Reconciled   Medication History  Humira (20MG/0.4ML Kit, Subcutaneous daily) Active. CeleBREX (200MG Capsule, Oral daily) Active. Bentyl (20MG Tablet, Oral daily) Active. Zofran (8MG Tablet, Oral as needed) Active. Oxycodone-Acetaminophen (5-325MG Tablet, Oral as needed) Active. Protonix (40MG Tablet DR, Oral daily) Active. Mirapex (0.125MG Tablet, Oral daily) Active. Zantac (150MG Tablet, Oral daily) Active. Chantix Starting Month Pak (0.5 MG X 11 &1 MG X 42 Tablet, Oral daily) Active. Medications Reconciled    Review of Systems  All other systems negative  BP  128/75   Pulse 64   Temp 98.3 F (36.8 C) (Oral)   Resp 19   Ht 6' (1.829 m)   Wt 84.4 kg (186 lb)   SpO2 100%   BMI 25.23 kg/m      Physical Exam  The physical exam findings are as follows: Note:Constitutional: No acute distress, conversant, appears stated age  Eyes: Anicteric sclerae, moist conjunctiva, no lid lag  Neck: No thyromegaly, trachea midline, no cervical lymphadenopathy  Lungs: Clear to auscultation biilaterally, normal respiratory effot  Cardiovascular: regular rate & rhythm, no murmurs, no peripheal edema, pedal pulses 2+  GI: Soft, no masses or hepatosplenomegaly, non-tender to palpation  MSK: Normal gait, no clubbing cyanosis, edema  Skin: No rashes, palpation reveals normal skin turgor  Psychiatric: Appropriate judgment and insight, oriented to person, place, and time  Abdomen Inspection Hernias - Incisional - Reducible(Upper midline incisional hernia, likely Swiss cheese defect, well-healed incision).    Assessment & Plan  INCISIONAL HERNIA, WITHOUT OBSTRUCTION OR GANGRENE (K43.2) Impression: 36 year old male with a incisional hernia, history of Crohn's, Humira  1. The patient would like to proceed to the operating room for open, retrorectus hernia repair with mesh. 2. I encouraged patient to stop smoking today. I discussed with him that he would likely take his last Humira shot this weekend. This would allow him to be approximately 4 weeks from surgery and off His Humira. 3. I discussed with him the risks and benefits of the procedure to include but not limited to: Infection, bleeding, damage to structures, and possible recurrence. The patient  was understanding and wishes to proceed.

## 2017-06-10 NOTE — Transfer of Care (Signed)
Immediate Anesthesia Transfer of Care Note  Patient: Leroy Herrera  Procedure(s) Performed: OPEN REPAIR REPAIR INCISIONAL HERNIA (N/A ) INSERTION OF MESH (N/A )  Patient Location: PACU  Anesthesia Type:General  Level of Consciousness: awake and alert   Airway & Oxygen Therapy: Patient Spontanous Breathing and Patient connected to nasal cannula oxygen  Post-op Assessment: Report given to RN, Post -op Vital signs reviewed and stable and Patient moving all extremities X 4  Post vital signs: Reviewed and stable  Last Vitals:  Vitals:   06/10/17 0959 06/10/17 1405  BP: 128/75   Pulse: 64   Resp: 19   Temp: 36.8 C (!) (P) 36.1 C  SpO2: 100%     Last Pain:  Vitals:   06/10/17 1007  TempSrc:   PainSc: 2       Patients Stated Pain Goal: 3 (53/01/04 0459)  Complications: No apparent anesthesia complications

## 2017-06-10 NOTE — Anesthesia Preprocedure Evaluation (Addendum)
Anesthesia Evaluation  Patient identified by MRN, date of birth, ID band Patient awake    Reviewed: Allergy & Precautions, NPO status , Patient's Chart, lab work & pertinent test results  Airway Mallampati: II  TM Distance: >3 FB Neck ROM: Full    Dental  (+) Teeth Intact, Dental Advisory Given   Pulmonary Current Smoker,    Pulmonary exam normal        Cardiovascular hypertension, Pt. on medications  Rhythm:Regular Rate:Normal     Neuro/Psych    GI/Hepatic Neg liver ROS, GERD  Medicated,  Endo/Other  negative endocrine ROS  Renal/GU Renal disease  negative genitourinary   Musculoskeletal negative musculoskeletal ROS (+)   Abdominal Normal abdominal exam  (+)   Peds  Hematology negative hematology ROS (+)   Anesthesia Other Findings - Crohn's -   Reproductive/Obstetrics                            Lab Results  Component Value Date   WBC 11.7 (H) 06/08/2017   HGB 14.6 06/08/2017   HCT 42.1 06/08/2017   MCV 84.7 06/08/2017   PLT 231 06/08/2017   Lab Results  Component Value Date   CREATININE 1.11 06/08/2017   BUN 13 06/08/2017   NA 137 06/08/2017   K 3.5 06/08/2017   CL 102 06/08/2017   CO2 26 06/08/2017   No results found for: INR, PROTIME  EKG: normal sinus rhythm.  Anesthesia Physical Anesthesia Plan  ASA: II  Anesthesia Plan: General   Post-op Pain Management:    Induction: Intravenous  PONV Risk Score and Plan: 2 and Ondansetron, Dexamethasone and Midazolam  Airway Management Planned: Oral ETT  Additional Equipment: None  Intra-op Plan:   Post-operative Plan: Extubation in OR  Informed Consent: I have reviewed the patients History and Physical, chart, labs and discussed the procedure including the risks, benefits and alternatives for the proposed anesthesia with the patient or authorized representative who has indicated his/her understanding and acceptance.    Dental advisory given  Plan Discussed with: CRNA  Anesthesia Plan Comments:         Anesthesia Quick Evaluation

## 2017-06-10 NOTE — Progress Notes (Signed)
1525 Received pt from PACU, A&O x4. Abdominal binder in place, c/o pain, prn pain meds given.

## 2017-06-11 ENCOUNTER — Other Ambulatory Visit: Payer: Self-pay | Admitting: Family Medicine

## 2017-06-11 ENCOUNTER — Encounter (HOSPITAL_COMMUNITY): Payer: Self-pay | Admitting: General Surgery

## 2017-06-11 MED ORDER — TRAMADOL HCL 50 MG PO TABS
50.0000 mg | ORAL_TABLET | Freq: Four times a day (QID) | ORAL | Status: DC | PRN
Start: 2017-06-11 — End: 2017-06-12
  Administered 2017-06-11 (×2): 50 mg via ORAL
  Filled 2017-06-11 (×2): qty 1

## 2017-06-11 NOTE — Anesthesia Postprocedure Evaluation (Signed)
Anesthesia Post Note  Patient: Leroy Herrera  Procedure(s) Performed: OPEN REPAIR REPAIR INCISIONAL HERNIA (N/A ) INSERTION OF MESH (N/A )     Patient location during evaluation: PACU Anesthesia Type: General Level of consciousness: awake and alert Pain management: pain level controlled Vital Signs Assessment: post-procedure vital signs reviewed and stable Respiratory status: spontaneous breathing, nonlabored ventilation, respiratory function stable and patient connected to nasal cannula oxygen Cardiovascular status: blood pressure returned to baseline and stable Postop Assessment: no apparent nausea or vomiting Anesthetic complications: no    Last Vitals:  Vitals:   06/10/17 2126 06/11/17 0529  BP: 127/88 106/65  Pulse: 78 92  Resp:    Temp: 37 C 37.1 C  SpO2: 99% 97%           Effie Berkshire

## 2017-06-11 NOTE — Progress Notes (Signed)
1 Day Post-Op   Subjective/Chief Complaint: Pt doing well today Had some pain overnight but feels better today   Objective: Vital signs in last 24 hours: Temp:  [97 F (36.1 C)-99.3 F (37.4 C)] 98.8 F (37.1 C) (12/20 0529) Pulse Rate:  [64-95] 92 (12/20 0529) Resp:  [10-20] 10 (12/19 1737) BP: (106-129)/(65-88) 106/65 (12/20 0529) SpO2:  [94 %-100 %] 97 % (12/20 0529) Weight:  [84.4 kg (186 lb)-89.2 kg (196 lb 10.4 oz)] 89.2 kg (196 lb 10.4 oz) (12/19 1529) Last BM Date: 06/09/17  Intake/Output from previous day: 12/19 0701 - 12/20 0700 In: 1750 [I.V.:1750] Out: 5374 [Urine:1650; Blood:25] Intake/Output this shift: No intake/output data recorded.  General appearance: alert and cooperative  Abd: incision c/d/i  Assessment/Plan: s/p Procedure(s): OPEN REPAIR REPAIR INCISIONAL HERNIA (N/A) INSERTION OF MESH (N/A) Advance diet as tol Mobilize Awaiting bowel function Hopefully feels good enough for home tomorrow  LOS: 1 day    Rosario Jacks., Anne Hahn 06/11/2017

## 2017-06-12 MED ORDER — OXYCODONE-ACETAMINOPHEN 5-325 MG PO TABS: 1.0000 | ORAL_TABLET | Freq: Four times a day (QID) | ORAL | 0 refills | Status: DC | PRN

## 2017-06-12 NOTE — Discharge Instructions (Signed)
[facility] _______[facility]  HERNIA REPAIR: POST OP INSTRUCTIONS  Always review your discharge instruction sheet given to you by the facility where your surgery was performed. IF YOU HAVE DISABILITY OR FAMILY LEAVE FORMS, YOU MUST BRING THEM TO THE OFFICE FOR PROCESSING.   DO NOT GIVE THEM TO YOUR DOCTOR.  1. A  prescription for pain medication may be given to you upon discharge.  Take your pain medication as prescribed, if needed.  If narcotic pain medicine is not needed, then you may take acetaminophen (Tylenol) or ibuprofen (Advil) as needed. 2. Take your usually prescribed medications unless otherwise directed. If you need a refill on your pain medication, please contact your pharmacy.  They will contact our office to request authorization. Prescriptions will not be filled after 5 pm or on week-ends. 3. You should follow a light diet the first 24 hours after arrival home, such as soup and crackers, etc.  Be sure to include lots of fluids daily.  Resume your normal diet the day after surgery. 4.Most patients will experience some swelling and bruising around the umbilicus or in the groin and scrotum.  Ice packs and reclining will help.  Swelling and bruising can take several days to resolve.  6. It is common to experience some constipation if taking pain medication after surgery.  Increasing fluid intake and taking a stool softener (such as Colace) will usually help or prevent this problem from occurring.  A mild laxative (Milk of Magnesia or Miralax) should be taken according to package directions if there are no bowel movements after 48 hours. 7. Unless discharge instructions indicate otherwise, you may remove your bandages 24-48 hours after surgery, and you may shower at that time.  You may have steri-strips (small skin tapes) in place directly over the incision.  These strips should be left on the skin for 7-10 days.  If your surgeon used skin glue on the incision, you may shower in  24 hours.  The glue will flake off over the next 2-3 weeks.  Any sutures or staples will be removed at the office during your follow-up visit. 8. ACTIVITIES:  You may resume regular (light) daily activities beginning the next day--such as daily self-care, walking, climbing stairs--gradually increasing activities as tolerated.  You may have sexual intercourse when it is comfortable.  Refrain from any heavy lifting or straining until approved by your doctor.  a.You may drive when you are no longer taking prescription pain medication, you can comfortably wear a seatbelt, and you can safely maneuver your car and apply brakes. b.RETURN TO WORK:   _____________________________________________  9.You should see your doctor in the office for a follow-up appointment approximately 2-3 weeks after your surgery.  Make sure that you call for this appointment within a day or two after you arrive home to insure a convenient appointment time. 10.OTHER INSTRUCTIONS: _________________________    _____________________________________  WHEN TO CALL YOUR DOCTOR: 1. Fever over 101.0 2. Inability to urinate 3. Nausea and/or vomiting 4. Extreme swelling or bruising 5. Continued bleeding from incision. 6. Increased pain, redness, or drainage from the incision  The clinic staff is available to answer your questions during regular business hours.  Please dont hesitate to call and ask to speak to one of the nurses for clinical concerns.  If you have a medical emergency, go to the nearest emergency room or call 911.  A surgeon from [facility] is always on call at the hospital   [address]  [address], [place], [place], [place]  [address] ?  [address], [place], [place]   [address] [phone] ? [phone] ? FAX [phone] Web site: [email or web address]

## 2017-06-12 NOTE — Discharge Summary (Signed)
Physician Discharge Summary  Patient ID: Leroy Herrera MRN: [id number] DOB/AGE: October 01, 1980 36 y.o.  Admit date: 06/10/2017 Discharge date: 06/12/2017  Admission Diagnoses: Incisional hernia  Discharge Diagnoses:  Active Problems:   S/P hernia repair   Discharged Condition: good  Hospital Course: Postoperatively patient was sent floor. Please see operative note for details. He was started on the ERAS protocol.  Patient did well postoperative day 1 and tolerated liquid diet. He was doing well on his own.  Was control. Patient was admitted to was continued and there was a regular diet. He had good pain control orally. He was otherwise afebrile, consent for discharge and discharged home.  Consults: None  Significant Diagnostic Studies: None  Treatments: surgery: As above  Discharge Exam: Blood pressure 115/70, pulse 85, temperature 98.4 F (36.9 C), temperature source Oral, resp. rate 16, height 6' (1.829 m), weight 89.2 kg (196 lb 10.4 oz), SpO2 95 %. Constitutional: No acute distress, conversant, appears states age. Eyes: Anicteric sclerae, moist conjunctiva, no lid lag Lungs: Clear to auscultation bilaterally, normal respiratory effort CV: regular rate and rhythm, no murmurs, no peripheral edema, pedal pulses 2+ GI: Soft, no masses or hepatosplenomegaly, non-tender to palpation , abdominal incision clean dry and intact, no erythema, no drainage Skin: No rashes, palpation reveals normal turgor Psychiatric: appropriate judgment and insight, oriented to person, place, and time   Disposition: 01-Home or Self Care  Discharge Instructions    Diet - low sodium heart healthy   Complete by:  As directed    Increase activity slowly   Complete by:  As directed      Allergies as of 06/12/2017      Reactions   Prednisone Anaphylaxis, Other (See Comments)   High dose allergy   Morphine And Related Hives, Itching   Vicodin [hydrocodone-acetaminophen] Hives, Itching       Medication List    TAKE these medications   cyclobenzaprine 10 MG tablet Commonly known as:  FLEXERIL Take 0.5-1 tablets (5-10 mg total) by mouth 3 (three) times daily as needed for muscle spasms (May cause drowsiness.).   DEXILANT 60 MG capsule Generic drug:  dexlansoprazole TAKE 1 CAPSULE BY MOUTH  ONCE DAILY What changed:    how much to take  how to take this  when to take this   dicyclomine 20 MG tablet Commonly known as:  BENTYL Take 1 tablet (20 mg total) by mouth 2 (two) times daily. What changed:    when to take this  reasons to take this   fluticasone 50 MCG/ACT nasal spray Commonly known as:  FLONASE Place 2 sprays into both nostrils daily. What changed:    how much to take  when to take this  reasons to take this   HUMIRA 40 MG/0.8ML Pskt Generic drug:  Adalimumab Inject 40 mg into the skin every 14 (fourteen) days.   ibuprofen 200 MG tablet Commonly known as:  ADVIL,MOTRIN Take 800 mg by mouth every 6 (six) hours as needed for moderate pain.   JOINT HEALTH PO Take 1 tablet by mouth daily.   lisinopril-hydrochlorothiazide 20-25 MG tablet Commonly known as:  PRINZIDE,ZESTORETIC Take 1 tablet by mouth daily. Office visit needed for refills What changed:  additional instructions   meloxicam 15 MG tablet Commonly known as:  MOBIC Take 1 tablet (15 mg total) by mouth daily.   multivitamin with iron-minerals liquid Take 15 mLs by mouth daily.   ondansetron 8 MG disintegrating tablet Commonly known as:  ZOFRAN-ODT Take 1 tablet (  8 mg total) by mouth every 8 (eight) hours as needed for nausea.   oxyCODONE-acetaminophen 5-325 MG tablet Commonly known as:  ROXICET Take 1 tablet by mouth every 6 (six) hours as needed.   ramelteon 8 MG tablet Commonly known as:  ROZEREM Take 1 tablet (8 mg total) by mouth at bedtime.   ranitidine 150 MG tablet Commonly known as:  ZANTAC Take 300 mg by mouth daily.   traMADol 50 MG tablet Commonly known  as:  ULTRAM Take 1-2 tablets (50-100 mg total) by mouth every 8 (eight) hours as needed.   varenicline 1 MG tablet Commonly known as:  CHANTIX CONTINUING MONTH PAK Take 1 tablet (1 mg total) by mouth 2 (two) times daily.   varenicline 0.5 MG X 11 & 1 MG X 42 tablet Commonly known as:  CHANTIX STARTING MONTH PAK Take one 0.5 mg tablet by mouth once daily for 3 days, then increase to one 0.5 mg tablet twice daily for 4 days, then increase to one 1 mg tablet twice daily.      Follow-up Information    Ralene Ok, MD. Schedule an appointment as soon as possible for a visit in 2 week(s).   Specialty:  General Surgery Contact information: [address] [place] [address] [phone]           Signed: Rosario Jacks., Anne Hahn 06/12/2017, 6:52 AM

## 2017-07-08 ENCOUNTER — Telehealth: Payer: Self-pay | Admitting: Family Medicine

## 2017-07-08 NOTE — Telephone Encounter (Signed)
Copied from [place] [id number]. Topic: Referral - Request >> Jul 08, 2017  8:34 AM Darl Householder, RMA wrote: Reason for CRM: patient is requesting a referral for Gastro for Dr. Carol Ada at [facility]

## 2017-07-11 NOTE — Telephone Encounter (Signed)
Please advise on GI referral pt was last seen in October for GI issues. Would you like to schedule follow-up OV or just place referral. Pt due to f/u in April.

## 2017-07-13 NOTE — Telephone Encounter (Signed)
[facility] called stating pt has an appt today (07/13/17) and is currently at the office. Pt's insurance requires referral authorization through insurance every time pt is seen at any office other than PCP. I was able to get this auth and the number is [id number]. This is authorized for visits with Dr. Carol Ada and is valid from 07/13/17-01/09/18 for 6 months or 6 visits. I provided this Josem Kaufmann number to [facility] and am faxing it to attention Lattie Haw or [place] at [phone].

## 2017-07-14 NOTE — Telephone Encounter (Signed)
I believe that is all I needed thank you.

## 2017-07-14 NOTE — Telephone Encounter (Signed)
Appreciate your great efforts in assisting patient. Need to do anything on my end to complete the referral process?

## 2017-07-16 ENCOUNTER — Other Ambulatory Visit: Payer: Self-pay | Admitting: Family Medicine

## 2017-07-17 NOTE — Telephone Encounter (Signed)
Refill for Zofran - requesting large amount. Thanks. Last office visit 03/30/17.

## 2017-07-21 ENCOUNTER — Telehealth: Payer: Self-pay | Admitting: Family Medicine

## 2017-07-21 ENCOUNTER — Other Ambulatory Visit: Payer: Self-pay | Admitting: *Deleted

## 2017-07-21 MED ORDER — ONDANSETRON 8 MG PO TBDP: 8.0000 mg | ORAL_TABLET | Freq: Three times a day (TID) | ORAL | 0 refills | Status: DC | PRN

## 2017-07-21 NOTE — Telephone Encounter (Unsigned)
Copied from [place]. Topic: Quick Communication - See Telephone Encounter >> Jul 21, 2017  9:26 AM Hewitt Shorts wrote: CRM for notification. See Telephone encounter for:  Pt wife is calling to see why the zofran was denied    Best number [phone]

## 2017-07-21 NOTE — Telephone Encounter (Signed)
Lm prescription sent in.  Pharmacy noted patient was no longer taking 12/20

## 2017-08-19 ENCOUNTER — Other Ambulatory Visit: Payer: Self-pay

## 2017-08-19 ENCOUNTER — Encounter: Payer: Self-pay | Admitting: Family Medicine

## 2017-08-19 ENCOUNTER — Ambulatory Visit (INDEPENDENT_AMBULATORY_CARE_PROVIDER_SITE_OTHER): Payer: 59 | Admitting: Family Medicine

## 2017-08-19 VITALS — BP 122/78 | HR 113 | Temp 98.0°F | Resp 16 | Ht 73.23 in | Wt 190.0 lb

## 2017-08-19 DIAGNOSIS — Z9889 Other specified postprocedural states: Secondary | ICD-10-CM

## 2017-08-19 DIAGNOSIS — R140 Abdominal distension (gaseous): Secondary | ICD-10-CM

## 2017-08-19 DIAGNOSIS — Z8719 Personal history of other diseases of the digestive system: Secondary | ICD-10-CM | POA: Diagnosis not present

## 2017-08-19 DIAGNOSIS — F1911 Other psychoactive substance abuse, in remission: Secondary | ICD-10-CM

## 2017-08-19 DIAGNOSIS — R1084 Generalized abdominal pain: Secondary | ICD-10-CM

## 2017-08-19 DIAGNOSIS — K50019 Crohn's disease of small intestine with unspecified complications: Secondary | ICD-10-CM | POA: Diagnosis not present

## 2017-08-19 MED ORDER — TRAMADOL HCL 50 MG PO TABS: 50.0000 mg | ORAL_TABLET | Freq: Every evening | ORAL | 0 refills | Status: DC | PRN

## 2017-08-19 NOTE — Patient Instructions (Addendum)
   START MIRALAX 1 CAPFUL IN 8 OUNCES OF WATER ONCE OR TWICE DAILY.     IF you received an x-ray today, you will receive an invoice from [facility]. Please contact [facility] at [phone] with questions or concerns regarding your invoice.   IF you received labwork today, you will receive an invoice from [facility]. Please contact [facility] at [phone] with questions or concerns regarding your invoice.   Our billing staff will not be able to assist you with questions regarding bills from these companies.  You will be contacted with the lab results as soon as they are available. The fastest way to get your results is to activate your My Chart account. Instructions are located on the last page of this paperwork. If you have not heard from [place] regarding the results in 2 weeks, please contact this office.

## 2017-08-19 NOTE — Progress Notes (Signed)
Subjective:    Patient ID: Leroy Herrera, male    DOB: [birth year], 37 y.o.   MRN: [id number]  08/19/2017  Hospitalization Follow-up (pt was seen in the ER on 08/16/17 for hernia and abdominal pain pt states he feel better with no complications )    HPI This 37 y.o. male presents for abdominal pain following incisional hernia repair in December 2018.  Having a lot of abdominal distention.  S/p CT scan of abdomen due to distention.  Does not feel like Crohn's disease.  Excessive gas.  Wife is scared is worried that Crohn's can flare.  Does not feel like Crohns.  Up half the night due to pain.  No excessive gas or belching or flatus.  Appetite is fare.  Some nausea; no vomiting.  Having bowel movement daily; since colon surgery in 06/2017, bowel movements are very normal.  Last month, gotten hard again which is unusual.  No follow-up with surgeon.  Saw Dr. Benson Norway of GI after surgery but still in post-operative period.   No excessive Ibuprofen.  Restarted taking Bentyl in past month.  Happens daily.  After work and gets home, eating or not eating pain really worsens.  No cramping.  Onset six weeks ago.  Diet is not perfect; eating whatever.  Wife cooks every night; eats at home.  Gotten minimal lunch to avoid pain.  No heartburn.  Discharge summary is outlined as follows:  Admission Diagnoses: Incisional hernia Discharge Diagnoses:  Active Problems:   S/P hernia repair Discharged Condition: good Hospital Course: Postoperatively patient was sent floor. Please see operative note for details. He was started on the ERAS protocol. Patient did well postoperative day 1 and tolerated liquid diet. He was doing well on his own. Was control. Patient was admitted to was continued and there was a regular diet. He had good pain control orally. He was otherwise afebrile, consent for discharge and discharged home. Consults: None Significant Diagnostic Studies: None Treatments: surgery: As above Discharge  Exam: Blood pressure 115/70, pulse 85, temperature 98.4 F (36.9 C), temperature source Oral, resp. rate 16, height 6' (1.829 m), weight 89.2 kg (196 lb 10.4 oz), SpO2 95 %. Constitutional: No acute distress, conversant, appears states age. Eyes: Anicteric sclerae, moist conjunctiva, no lid lag Lungs: Clear to auscultation bilaterally, normal respiratory effort CV: regular rate and rhythm, no murmurs, no peripheral edema, pedal pulses 2+ GI: Soft, no masses or hepatosplenomegaly, non-tender to palpation , abdominal incision clean dry and intact, no erythema, no drainage Skin: No rashes, palpation reveals normal turgor Psychiatric: appropriate judgment and insight, oriented to person, place, and time Disposition: 01-Home or Self Care      Discharge Instructions    Diet - low sodium heart healthy   Complete by:  As directed    Increase activity slowly   Complete by:  As directed          Allergies as of 06/12/2017      Reactions   Prednisone Anaphylaxis, Other (See Comments)   High dose allergy   Morphine And Related Hives, Itching   Vicodin [hydrocodone-acetaminophen] Hives, Itching           Medication List     TAKE these medications   cyclobenzaprine 10 MG tablet Commonly known as:  FLEXERIL Take 0.5-1 tablets (5-10 mg total) by mouth 3 (three) times daily as needed for muscle spasms (May cause drowsiness.).   DEXILANT 60 MG capsule Generic drug:  dexlansoprazole TAKE 1 CAPSULE BY MOUTH  ONCE  DAILY What changed:    how much to take  how to take this  when to take this   dicyclomine 20 MG tablet Commonly known as:  BENTYL Take 1 tablet (20 mg total) by mouth 2 (two) times daily. What changed:    when to take this  reasons to take this   fluticasone 50 MCG/ACT nasal spray Commonly known as:  FLONASE Place 2 sprays into both nostrils daily. What changed:    how much to take  when to take this  reasons to take this   HUMIRA 40  MG/0.8ML Pskt Generic drug:  Adalimumab Inject 40 mg into the skin every 14 (fourteen) days.   ibuprofen 200 MG tablet Commonly known as:  ADVIL,MOTRIN Take 800 mg by mouth every 6 (six) hours as needed for moderate pain.   JOINT HEALTH PO Take 1 tablet by mouth daily.   lisinopril-hydrochlorothiazide 20-25 MG tablet Commonly known as:  PRINZIDE,ZESTORETIC Take 1 tablet by mouth daily. Office visit needed for refills What changed:  additional instructions   meloxicam 15 MG tablet Commonly known as:  MOBIC Take 1 tablet (15 mg total) by mouth daily.   multivitamin with iron-minerals liquid Take 15 mLs by mouth daily.   ondansetron 8 MG disintegrating tablet Commonly known as:  ZOFRAN-ODT Take 1 tablet (8 mg total) by mouth every 8 (eight) hours as needed for nausea.   oxyCODONE-acetaminophen 5-325 MG tablet Commonly known as:  ROXICET Take 1 tablet by mouth every 6 (six) hours as needed.   ramelteon 8 MG tablet Commonly known as:  ROZEREM Take 1 tablet (8 mg total) by mouth at bedtime.   ranitidine 150 MG tablet Commonly known as:  ZANTAC Take 300 mg by mouth daily.   traMADol 50 MG tablet Commonly known as:  ULTRAM Take 1-2 tablets (50-100 mg total) by mouth every 8 (eight) hours as needed.   varenicline 1 MG tablet Commonly known as:  CHANTIX CONTINUING MONTH PAK Take 1 tablet (1 mg total) by mouth 2 (two) times daily.   varenicline 0.5 MG X 11 & 1 MG X 42 tablet Commonly known as:  CHANTIX STARTING MONTH PAK Take one 0.5 mg tablet by mouth once daily for 3 days, then increase to one 0.5 mg tablet twice daily for 4 days, then increase to one 1 mg tablet twice daily.         Follow-up Information    Ralene Ok, MD. Schedule an appointment as soon as possible for a visit in 2 week(s).   Specialty:  General Surgery Contact information: [address] [place] [address] [phone]           BP Readings from Last  3 Encounters:  08/19/17 122/78  06/12/17 115/70  06/08/17 124/79   Wt Readings from Last 3 Encounters:  08/19/17 190 lb (86.2 kg)  06/10/17 196 lb 10.4 oz (89.2 kg)  06/08/17 186 lb 12.8 oz (84.7 kg)   Immunization History  Administered Date(s) Administered  . Influenza Split 03/25/2012  . Influenza,inj,Quad PF,6+ Mos 03/28/2013, 09/12/2015, 02/19/2016  . PPD Test 06/05/2016  . Pneumococcal Polysaccharide-23 02/19/2016  . Td 06/24/2011    Review of Systems  Constitutional: Negative for activity change, appetite change, chills, diaphoresis, fatigue and fever.  Respiratory: Negative for cough and shortness of breath.   Cardiovascular: Negative for chest pain, palpitations and leg swelling.  Gastrointestinal: Positive for abdominal distention and abdominal pain. Negative for anal bleeding, blood in stool, constipation, diarrhea, nausea,  rectal pain and vomiting.  Endocrine: Negative for cold intolerance, heat intolerance, polydipsia, polyphagia and polyuria.  Skin: Negative for color change, rash and wound.  Neurological: Negative for dizziness, tremors, seizures, syncope, facial asymmetry, speech difficulty, weakness, light-headedness, numbness and headaches.  Psychiatric/Behavioral: Negative for dysphoric mood and sleep disturbance. The patient is not nervous/anxious.     Past Medical History:  Diagnosis Date  . Arthritis    "joints; mild" (06/10/2017)  . Crohn's colitis ([place])   . Crohn's disease ([place])   . GERD (gastroesophageal reflux disease)   . Heroin addiction ([place]) 06/23/2009   Remission since 2011.  Marland Kitchen History of kidney stones   . Hypertension   . Pancreatitis X 1  . Seasonal allergies    Past Surgical History:  Procedure Laterality Date  . APPENDECTOMY  2000  . COLECTOMY  ~ 2000  . COLON RESECTION Right 07/17/2016   Procedure: LAPAROSCOPIC HAND ASSISTED TERMINAL ILEUM AND RIGHT COLON;  Surgeon: [name], MD;  Location: [facility];  Service: General;  Laterality:  Right;  . COLONOSCOPY     multiple, last was in ~ 01/2017 (06/10/2017)  . HERNIA REPAIR    . INCISIONAL HERNIA REPAIR    . INCISIONAL HERNIA REPAIR N/A 06/10/2017   Procedure: OPEN REPAIR REPAIR INCISIONAL HERNIA;  Surgeon: Ralene Ok, MD;  Location: [facility];  Service: General;  Laterality: N/A;  . INSERTION OF MESH N/A 06/10/2017   Procedure: INSERTION OF MESH;  Surgeon: Ralene Ok, MD;  Location: [facility];  Service: General;  Laterality: N/A;  . LAPAROSCOPIC ABDOMINAL EXPLORATION  ~ 2001   "for scar tissue"  . LAPAROSCOPIC CHOLECYSTECTOMY  2005  . TONSILLECTOMY     Allergies  Allergen Reactions  . Prednisone Anaphylaxis and Other (See Comments)    High dose allergy  . Morphine And Related Hives and Itching  . Vicodin [Hydrocodone-Acetaminophen] Hives and Itching   Current Outpatient Medications on File Prior to Visit  Medication Sig Dispense Refill  . Adalimumab (HUMIRA) 40 MG/0.8ML PSKT Inject 40 mg into the skin every 14 (fourteen) days.    . cyclobenzaprine (FLEXERIL) 10 MG tablet Take 0.5-1 tablets (5-10 mg total) by mouth 3 (three) times daily as needed for muscle spasms (May cause drowsiness.). 30 tablet 0  . DEXILANT 60 MG capsule TAKE 1 CAPSULE BY MOUTH  ONCE DAILY (Patient taking differently: Take 60 mg by mouth once daily) 90 capsule 3  . dicyclomine (BENTYL) 20 MG tablet Take 1 tablet (20 mg total) by mouth 2 (two) times daily. (Patient taking differently: Take 20 mg by mouth 2 (two) times daily as needed for spasms. ) 20 tablet 0  . fluticasone (FLONASE) 50 MCG/ACT nasal spray Place 2 sprays into both nostrils daily. (Patient taking differently: Place 1 spray into both nostrils daily as needed for allergies. ) 48 g 3  . Glucosamine-MSM-Hyaluronic Acd (JOINT HEALTH PO) Take 1 tablet by mouth daily.    Marland Kitchen ibuprofen (ADVIL,MOTRIN) 200 MG tablet Take 800 mg by mouth every 6 (six) hours as needed for moderate pain.     Marland Kitchen lisinopril-hydrochlorothiazide (PRINZIDE,ZESTORETIC)  20-25 MG tablet Take 1 tablet by mouth daily. Office visit needed for refills 30 tablet 0  . meloxicam (MOBIC) 15 MG tablet Take 1 tablet (15 mg total) by mouth daily. 30 tablet 0  . Multiple Vitamins-Minerals (MULTIVITAMIN WITH IRON-MINERALS) liquid Take 15 mLs by mouth daily.    . ondansetron (ZOFRAN-ODT) 8 MG disintegrating tablet Take 1 tablet (8 mg total) by mouth every 8 (  eight) hours as needed for nausea. 60 tablet 0  . oxyCODONE-acetaminophen (ROXICET) 5-325 MG tablet Take 1 tablet by mouth every 6 (six) hours as needed. 20 tablet 0  . ramelteon (ROZEREM) 8 MG tablet Take 1 tablet (8 mg total) by mouth at bedtime. 30 tablet 5  . ranitidine (ZANTAC) 150 MG tablet Take 300 mg by mouth daily.     . varenicline (CHANTIX CONTINUING MONTH PAK) 1 MG tablet Take 1 tablet (1 mg total) by mouth 2 (two) times daily. 180 tablet 0  . varenicline (CHANTIX STARTING MONTH PAK) 0.5 MG X 11 & 1 MG X 42 tablet Take one 0.5 mg tablet by mouth once daily for 3 days, then increase to one 0.5 mg tablet twice daily for 4 days, then increase to one 1 mg tablet twice daily. 53 tablet 0   No current facility-administered medications on file prior to visit.    Social History   Socioeconomic History  . Marital status: Married    Spouse name: Estill Bamberg  . Number of children: 2  . Years of education: Not on file  . Highest education level: Not on file  Social Needs  . Financial resource strain: Not on file  . Food insecurity - worry: Not on file  . Food insecurity - inability: Not on file  . Transportation needs - medical: Not on file  . Transportation needs - non-medical: Not on file  Occupational History  . Occupation: Cabin crew  Tobacco Use  . Smoking status: Current Every Day Smoker    Packs/day: 1.00    Years: 20.00    Pack years: 20.00    Types: Cigarettes  . Smokeless tobacco: Never Used  Substance and Sexual Activity  . Alcohol use: No  . Drug use: No    Comment: PREVIOUS HEROIN ADDICTION  2009; RECOVERY; "clean since 01/17/2008" (06/10/2017)  . Sexual activity: Yes  Other Topics Concern  . Not on file  Social History Narrative   Marital status: Married x 5 years; second marriage. Divorced from first marriage due to heroin addiction.      Children: one son (75 yo [place]) lives with patient and wife; one daughter Larena Glassman) who lives with mother. Gets every Thursday night; then has Thursday to Sunday the following week.      Lives: with wife, son.      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  Narcotic abuse/overuse/misuse in past.      Exercise: none in 2017      Seatbelt: 100%; no texting.       Family History  Problem Relation Age of Onset  . Diabetes Mother   . Arthritis Mother        knee replacement B  . Diabetes Father   . Hyperlipidemia Father   . Hypertension Father   . COPD Father        non-smoker; mill work.  Marland Kitchen Heart disease Father        CHF  . Hypertension Brother        Objective:    BP 122/78   Pulse (!) 113   Temp 98 F (36.7 C) (Oral)   Resp 16   Ht 6' 1.23" (1.86 m)   Wt 190 lb (86.2 kg)   SpO2 96%   BMI 24.91  kg/m  Physical Exam  Constitutional: He is oriented to person, place, and time. He appears well-developed and well-nourished. No distress.  HENT:  Head: Normocephalic and atraumatic.  Right Ear: External ear normal.  Left Ear: External ear normal.  Nose: Nose normal.  Mouth/Throat: Oropharynx is clear and moist.  Eyes: Conjunctivae and EOM are normal. Pupils are equal, round, and reactive to light.  Neck: Normal range of motion. Neck supple. Carotid bruit is not present. No thyromegaly present.  Cardiovascular: Normal rate, regular rhythm, normal heart sounds and intact distal pulses. Exam reveals no gallop and no friction rub.  No murmur heard. Pulmonary/Chest: Effort normal and breath  sounds normal. He has no wheezes. He has no rales.  Abdominal: Soft. Bowel sounds are normal. He exhibits distension. He exhibits no mass. There is tenderness. There is no rebound and no guarding.  Well-healed midline vertical incision inferior to the umbilicus  Lymphadenopathy:    He has no cervical adenopathy.  Neurological: He is alert and oriented to person, place, and time. No cranial nerve deficit.  Skin: Skin is warm and dry. No rash noted. He is not diaphoretic.  Psychiatric: He has a normal mood and affect. His behavior is normal.  Nursing note and vitals reviewed.  No results found. Depression screen [facility] 2/9 08/19/2017 03/30/2017 11/07/2016 10/20/2016 06/05/2016  Decreased Interest 0 0 0 0 0  Down, Depressed, Hopeless 0 0 0 0 0  PHQ - 2 Score 0 0 0 0 0   Fall Risk  08/19/2017 03/30/2017 11/07/2016 10/20/2016 06/05/2016  Falls in the past year? No No No No No        Assessment & Plan:   1. Generalized abdominal pain   2. Abdominal distention   3. Crohn's disease of small intestine with complication ([place])    New onset abdominal pain with abdominal distention and bloating since incisional hernia repair within the past 3 months.  There is been no change in bowel habits with more straining.  Obtain labs including sedimentation rate.  Treat for underlying constipation with MiraLAX daily or Colace daily.  Request the patient email me with update in the next 2-3 weeks.  If no improvement with Colace therapy or MiraLAX therapy, recommend follow-up with gastroenterologist.  Current symptoms are not consistent with previous Crohn's symptoms yet must be considered.  Status post recent emergency department visit due to symptoms.  CT of the abdomen and pelvis obtained and negative for acute process.  Agreeable to one-time prescription for tramadol to treat pain at nighttime to.  Advised patient that we want to mask abdominal pain or abdominal symptoms.  Patient expressed understanding.  Orders  Placed This Encounter  Procedures  . CBC with Differential/Platelet  . Comprehensive metabolic panel  . Lipase  . Amylase  . Sedimentation rate   Meds ordered this encounter  Medications  . traMADol (ULTRAM) 50 MG tablet    Sig: Take 1-2 tablets (50-100 mg total) by mouth at bedtime as needed.    Dispense:  30 tablet    Refill:  0    Return in about 2 weeks (around 09/02/2017) for recheck.   Zeynab Klett Elayne Guerin, M.D. Primary Care at [facility] [address] [place], [place]  [address] [phone] phone [phone] fax

## 2017-08-20 LAB — COMPREHENSIVE METABOLIC PANEL
ALK PHOS: 156 IU/L — AB (ref 39–117)
ALT: 40 IU/L (ref 0–44)
AST: 24 IU/L (ref 0–40)
Albumin/Globulin Ratio: 2.2 (ref 1.2–2.2)
Albumin: 5.2 g/dL (ref 3.5–5.5)
BUN/Creatinine Ratio: 10 (ref 9–20)
BUN: 10 mg/dL (ref 6–20)
Bilirubin Total: 0.2 mg/dL (ref 0.0–1.2)
CO2: 22 mmol/L (ref 20–29)
CREATININE: 1.03 mg/dL (ref 0.76–1.27)
Calcium: 10.1 mg/dL (ref 8.7–10.2)
Chloride: 100 mmol/L (ref 96–106)
GFR calc Af Amer: 106 mL/min/{1.73_m2} (ref >59)
GFR calc non Af Amer: 92 mL/min/{1.73_m2} (ref >59)
GLUCOSE: 79 mg/dL (ref 65–99)
Globulin, Total: 2.4 g/dL (ref 1.5–4.5)
Potassium: 4.6 mmol/L (ref 3.5–5.2)
Sodium: 140 mmol/L (ref 134–144)
Total Protein: 7.6 g/dL (ref 6.0–8.5)

## 2017-08-20 LAB — CBC WITH DIFFERENTIAL/PLATELET
Basophils Absolute: 0.1 10*3/uL (ref 0.0–0.2)
Basos: 1 %
EOS (ABSOLUTE): 0.2 10*3/uL (ref 0.0–0.4)
EOS: 2 %
Hematocrit: 44.3 % (ref 37.5–51.0)
Hemoglobin: 15.4 g/dL (ref 13.0–17.7)
IMMATURE GRANULOCYTES: 0 %
Immature Grans (Abs): 0 10*3/uL (ref 0.0–0.1)
LYMPHS: 29 %
Lymphocytes Absolute: 3.4 10*3/uL — ABNORMAL HIGH (ref 0.7–3.1)
MCH: 29.5 pg (ref 26.6–33.0)
MCHC: 34.8 g/dL (ref 31.5–35.7)
MCV: 85 fL (ref 79–97)
MONOS ABS: 0.9 10*3/uL (ref 0.1–0.9)
Monocytes: 8 %
NEUTROS PCT: 60 %
Neutrophils Absolute: 7.1 10*3/uL — ABNORMAL HIGH (ref 1.4–7.0)
Platelets: 273 10*3/uL (ref 150–379)
RBC: 5.22 x10E6/uL (ref 4.14–5.80)
RDW: 13.6 % (ref 12.3–15.4)
WBC: 11.7 10*3/uL — AB (ref 3.4–10.8)

## 2017-08-20 LAB — SEDIMENTATION RATE: Sed Rate: 2 mm/hr (ref 0–15)

## 2017-08-20 LAB — LIPASE: LIPASE: 18 U/L (ref 13–78)

## 2017-08-20 LAB — AMYLASE: AMYLASE: 44 U/L (ref 31–124)

## 2017-08-28 ENCOUNTER — Other Ambulatory Visit: Payer: Self-pay | Admitting: Family Medicine

## 2017-09-04 ENCOUNTER — Ambulatory Visit (INDEPENDENT_AMBULATORY_CARE_PROVIDER_SITE_OTHER): Payer: 59 | Admitting: Family Medicine

## 2017-09-04 ENCOUNTER — Other Ambulatory Visit: Payer: Self-pay

## 2017-09-04 ENCOUNTER — Encounter: Payer: Self-pay | Admitting: Family Medicine

## 2017-09-04 VITALS — BP 118/72 | HR 110 | Temp 98.0°F | Resp 16 | Ht 73.43 in | Wt 194.0 lb

## 2017-09-04 DIAGNOSIS — Z9889 Other specified postprocedural states: Secondary | ICD-10-CM | POA: Diagnosis not present

## 2017-09-04 DIAGNOSIS — Z720 Tobacco use: Secondary | ICD-10-CM

## 2017-09-04 DIAGNOSIS — R1084 Generalized abdominal pain: Secondary | ICD-10-CM

## 2017-09-04 DIAGNOSIS — F1911 Other psychoactive substance abuse, in remission: Secondary | ICD-10-CM | POA: Diagnosis not present

## 2017-09-04 DIAGNOSIS — I10 Essential (primary) hypertension: Secondary | ICD-10-CM | POA: Diagnosis not present

## 2017-09-04 DIAGNOSIS — E7800 Pure hypercholesterolemia, unspecified: Secondary | ICD-10-CM

## 2017-09-04 DIAGNOSIS — K219 Gastro-esophageal reflux disease without esophagitis: Secondary | ICD-10-CM | POA: Diagnosis not present

## 2017-09-04 DIAGNOSIS — Z8719 Personal history of other diseases of the digestive system: Secondary | ICD-10-CM

## 2017-09-04 MED ORDER — LISINOPRIL-HYDROCHLOROTHIAZIDE 20-25 MG PO TABS: 1.0000 | ORAL_TABLET | Freq: Every day | ORAL | 1 refills | Status: DC

## 2017-09-04 NOTE — Patient Instructions (Signed)
     IF you received an x-ray today, you will receive an invoice from [facility]. Please contact [facility] at [phone] with questions or concerns regarding your invoice.   IF you received labwork today, you will receive an invoice from [facility]. Please contact [facility] at [phone] with questions or concerns regarding your invoice.   Our billing staff will not be able to assist you with questions regarding bills from these companies.  You will be contacted with the lab results as soon as they are available. The fastest way to get your results is to activate your My Chart account. Instructions are located on the last page of this paperwork. If you have not heard from us regarding the results in 2 weeks, please contact this office.     

## 2017-09-04 NOTE — Progress Notes (Signed)
Subjective:    Patient ID: Leroy Herrera, male    DOB: 1981-02-08, 37 y.o.   MRN: [id number]  09/04/2017  Abdominal Pain ( 2 week follow-up fron OV 2/27 pt states he feels much better )    HPI This 37 y.o. male presents for two week follow-up abdominal pain.  Management changes made at last visit include the following:  New onset abdominal pain with abdominal distention and bloating since incisional hernia repair within the past 3 months.  There is been no change in bowel habits with more straining.  Obtain labs including sedimentation rate.  Treat for underlying constipation with MiraLAX daily or Colace daily.  Request the patient email me with update in the next 2-3 weeks.  If no improvement with Colace therapy or MiraLAX therapy, recommend follow-up with gastroenterologist.  Current symptoms are not consistent with previous Crohn's symptoms yet must be considered.  Status post recent emergency department visit due to symptoms.  CT of the abdomen and pelvis obtained and negative for acute process.  Agreeable to one-time prescription for tramadol to treat pain at nighttime to.  Advised patient that we want to mask abdominal pain or abdominal symptoms.  Patient expressed understanding.  UPDATE:  Tried Miralax and caused a lot of diarrhea for three days; took Miralax for two days.  BRAT diet; avoiding spicy foods.  50% improved.  Not waking up as much at night in pain.   Not taking Tramadol at night.  Best has been since December 2018.   Adds hot sauce to everything.  Loves hot sauce and spicy sauce for past six months.  Bowel movements are normalizing; no longer straining; not formed or hard.  Having 1-2 per day.  Urinating is fine.   R sided abdominal pain; not straining and putting more stress on abdomen.    DID GET FLU VACCINE.  Needs refill of Lisinopril for 90 day; only 30 days sent in.   Hypercholesterolemia:  Needs fasting.   BP Readings from Last 3 Encounters:  09/04/17 118/72    08/19/17 122/78  06/12/17 115/70   Wt Readings from Last 3 Encounters:  09/04/17 194 lb (88 kg)  08/19/17 190 lb (86.2 kg)  06/10/17 196 lb 10.4 oz (89.2 kg)   Immunization History  Administered Date(s) Administered  . Influenza Split 03/25/2012  . Influenza,inj,Quad PF,6+ Mos 03/28/2013, 09/12/2015, 02/19/2016  . PPD Test 06/05/2016  . Pneumococcal Polysaccharide-23 02/19/2016  . Td 06/24/2011    Review of Systems  Constitutional: Negative for activity change, appetite change, chills, diaphoresis, fatigue and fever.  Respiratory: Negative for cough and shortness of breath.   Cardiovascular: Negative for chest pain, palpitations and leg swelling.  Gastrointestinal: Positive for abdominal pain. Negative for abdominal distention, anal bleeding, blood in stool, constipation, diarrhea, nausea, rectal pain and vomiting.  Endocrine: Negative for cold intolerance, heat intolerance, polydipsia, polyphagia and polyuria.  Skin: Negative for color change, rash and wound.  Neurological: Negative for dizziness, tremors, seizures, syncope, facial asymmetry, speech difficulty, weakness, light-headedness, numbness and headaches.  Psychiatric/Behavioral: Negative for dysphoric mood and sleep disturbance. The patient is not nervous/anxious.     Past Medical History:  Diagnosis Date  . Arthritis    "joints; mild" (06/10/2017)  . Crohn's colitis ([place])   . Crohn's disease ([place])   . GERD (gastroesophageal reflux disease)   . Heroin addiction ([place]) 06/23/2009   Remission since 2011.  Marland Kitchen History of kidney stones   . Hypertension   . Pancreatitis X 1  .  Seasonal allergies    Past Surgical History:  Procedure Laterality Date  . APPENDECTOMY  2000  . COLECTOMY  ~ 2000  . COLON RESECTION Right 07/17/2016   Procedure: LAPAROSCOPIC HAND ASSISTED TERMINAL ILEUM AND RIGHT COLON;  Surgeon: [name], MD;  Location: [facility];  Service: General;  Laterality: Right;  . COLONOSCOPY     multiple, last  was in ~ 01/2017 (06/10/2017)  . HERNIA REPAIR    . INCISIONAL HERNIA REPAIR    . INCISIONAL HERNIA REPAIR N/A 06/10/2017   Procedure: OPEN REPAIR REPAIR INCISIONAL HERNIA;  Surgeon: Ralene Ok, MD;  Location: [facility];  Service: General;  Laterality: N/A;  . INSERTION OF MESH N/A 06/10/2017   Procedure: INSERTION OF MESH;  Surgeon: Ralene Ok, MD;  Location: [facility];  Service: General;  Laterality: N/A;  . LAPAROSCOPIC ABDOMINAL EXPLORATION  ~ 2001   "for scar tissue"  . LAPAROSCOPIC CHOLECYSTECTOMY  2005  . TONSILLECTOMY     Allergies  Allergen Reactions  . Prednisone Anaphylaxis and Other (See Comments)    High dose allergy  . Morphine And Related Hives and Itching  . Vicodin [Hydrocodone-Acetaminophen] Hives and Itching   Current Outpatient Medications on File Prior to Visit  Medication Sig Dispense Refill  . Adalimumab (HUMIRA) 40 MG/0.8ML PSKT Inject 40 mg into the skin every 14 (fourteen) days.    . cyclobenzaprine (FLEXERIL) 10 MG tablet Take 0.5-1 tablets (5-10 mg total) by mouth 3 (three) times daily as needed for muscle spasms (May cause drowsiness.). 30 tablet 0  . DEXILANT 60 MG capsule TAKE 1 CAPSULE BY MOUTH  ONCE DAILY (Patient taking differently: Take 60 mg by mouth once daily) 90 capsule 3  . dicyclomine (BENTYL) 20 MG tablet Take 1 tablet (20 mg total) by mouth 2 (two) times daily. (Patient taking differently: Take 20 mg by mouth 2 (two) times daily as needed for spasms. ) 20 tablet 0  . fluticasone (FLONASE) 50 MCG/ACT nasal spray Place 2 sprays into both nostrils daily. (Patient taking differently: Place 1 spray into both nostrils daily as needed for allergies. ) 48 g 3  . Glucosamine-MSM-Hyaluronic Acd (JOINT HEALTH PO) Take 1 tablet by mouth daily.    Marland Kitchen ibuprofen (ADVIL,MOTRIN) 200 MG tablet Take 800 mg by mouth every 6 (six) hours as needed for moderate pain.     . meloxicam (MOBIC) 15 MG tablet Take 1 tablet (15 mg total) by mouth daily. 30 tablet 0  .  Multiple Vitamins-Minerals (MULTIVITAMIN WITH IRON-MINERALS) liquid Take 15 mLs by mouth daily.    Marland Kitchen oxyCODONE-acetaminophen (ROXICET) 5-325 MG tablet Take 1 tablet by mouth every 6 (six) hours as needed. 20 tablet 0  . ramelteon (ROZEREM) 8 MG tablet Take 1 tablet (8 mg total) by mouth at bedtime. 30 tablet 5  . ranitidine (ZANTAC) 150 MG tablet Take 300 mg by mouth daily.     . traMADol (ULTRAM) 50 MG tablet Take 1-2 tablets (50-100 mg total) by mouth at bedtime as needed. 30 tablet 0  . varenicline (CHANTIX CONTINUING MONTH PAK) 1 MG tablet Take 1 tablet (1 mg total) by mouth 2 (two) times daily. 180 tablet 0  . varenicline (CHANTIX STARTING MONTH PAK) 0.5 MG X 11 & 1 MG X 42 tablet Take one 0.5 mg tablet by mouth once daily for 3 days, then increase to one 0.5 mg tablet twice daily for 4 days, then increase to one 1 mg tablet twice daily. 53 tablet 0   No current facility-administered  medications on file prior to visit.    Social History   Socioeconomic History  . Marital status: Married    Spouse name: Estill Bamberg  . Number of children: 2  . Years of education: Not on file  . Highest education level: Not on file  Occupational History  . Occupation: Cabin crew  Social Needs  . Financial resource strain: Not on file  . Food insecurity:    Worry: Not on file    Inability: Not on file  . Transportation needs:    Medical: Not on file    Non-medical: Not on file  Tobacco Use  . Smoking status: Current Every Day Smoker    Packs/day: 1.00    Years: 20.00    Pack years: 20.00    Types: Cigarettes  . Smokeless tobacco: Never Used  Substance and Sexual Activity  . Alcohol use: No  . Drug use: No    Types: Other-see comments, Heroin    Comment: PREVIOUS HEROIN ADDICTION 2009; RECOVERY; "clean since 01/17/2008" (06/10/2017)  . Sexual activity: Yes  Lifestyle  . Physical activity:    Days per week: Not on file    Minutes per session: Not on file  . Stress: Not on file  Relationships   . Social connections:    Talks on phone: Not on file    Gets together: Not on file    Attends religious service: Not on file    Active member of club or organization: Not on file    Attends meetings of clubs or organizations: Not on file    Relationship status: Not on file  . Intimate partner violence:    Fear of current or ex partner: Not on file    Emotionally abused: Not on file    Physically abused: Not on file    Forced sexual activity: Not on file  Other Topics Concern  . Not on file  Social History Narrative   Marital status: Married x 5 years; second marriage. Divorced from first marriage due to heroin addiction.      Children: one son (7 yo [place]) lives with patient and wife; one daughter Larena Glassman) who lives with mother. Gets every Thursday night; then has Thursday to Sunday the following week.      Lives: with wife, son.      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  Narcotic abuse/overuse/misuse in past.      Exercise: none in 2017      Seatbelt: 100%; no texting.       Family History  Problem Relation Age of Onset  . Diabetes Mother   . Arthritis Mother        knee replacement B  . Diabetes Father   . Hyperlipidemia Father   . Hypertension Father   . COPD Father        non-smoker; mill work.  Marland Kitchen Heart disease Father        CHF  . Hypertension Brother        Objective:    BP 118/72   Pulse (!) 110   Temp 98 F (36.7 C) (Oral)   Resp 16   Ht 6' 1.43" (1.865 m)   Wt 194 lb (88 kg)   SpO2 96%   BMI 25.30 kg/m  Physical Exam  Constitutional:  He is oriented to person, place, and time. He appears well-developed and well-nourished. No distress.  HENT:  Head: Normocephalic and atraumatic.  Right Ear: External ear normal.  Left Ear: External ear normal.  Nose: Nose normal.  Mouth/Throat: Oropharynx is clear and  moist.  Eyes: Conjunctivae and EOM are normal. Pupils are equal, round, and reactive to light.  Neck: Normal range of motion. Neck supple. Carotid bruit is not present. No thyromegaly present.  Cardiovascular: Normal rate, regular rhythm, normal heart sounds and intact distal pulses. Exam reveals no gallop and no friction rub.  No murmur heard. Pulmonary/Chest: Effort normal and breath sounds normal. He has no wheezes. He has no rales.  Abdominal: Soft. Bowel sounds are normal. He exhibits no distension and no mass. There is no tenderness. There is no rebound and no guarding.  Lymphadenopathy:    He has no cervical adenopathy.  Neurological: He is alert and oriented to person, place, and time. No cranial nerve deficit.  Skin: Skin is warm and dry. No rash noted. He is not diaphoretic.  Psychiatric: He has a normal mood and affect. His behavior is normal.  Nursing note and vitals reviewed.  No results found. Depression screen [facility] 2/9 09/04/2017 08/19/2017 03/30/2017 11/07/2016 10/20/2016  Decreased Interest 0 0 0 0 0  Down, Depressed, Hopeless 0 0 0 0 0  PHQ - 2 Score 0 0 0 0 0   Fall Risk  09/04/2017 08/19/2017 03/30/2017 11/07/2016 10/20/2016  Falls in the past year? No No No No No        Assessment & Plan:   1. Generalized abdominal pain   2. Pure hypercholesterolemia   3. Gastroesophageal reflux disease without esophagitis   4. Essential hypertension, benign   5. S/P hernia repair   6. Substance abuse in remission ([place])   7. Tobacco abuse     Abdominal pain much improved with change in diet.  Now having normal bowel movements.  Continue to avoid spicy foods.  Recent labs all normal and reviewed with patient in office.  If recurrent abdominal pain occurs, recommend follow-up with gastroenterologist Dr. Almyra Free.  Hypertension is well controlled on current regimen.  Refill provided.  Hypercholesterolemia: Obtain fasting lipid panel today in office.  GERD is well controlled on current  regimen.  Orders Placed This Encounter  Procedures  . Lipid panel    Standing Status:   Future    Standing Expiration Date:   09/05/2018    Order Specific Question:   Has the patient fasted?    Answer:   No   Meds ordered this encounter  Medications  . lisinopril-hydrochlorothiazide (PRINZIDE,ZESTORETIC) 20-25 MG tablet    Sig: Take 1 tablet by mouth daily.    Dispense:  90 tablet    Refill:  1    Return in about 6 months (around 03/07/2018) for follow-up chronic medical conditions.   Howard Patton Elayne Guerin, M.D. Primary Care at [facility] [address] [place], [place]  [address] [phone] phone [phone] fax

## 2017-09-19 ENCOUNTER — Other Ambulatory Visit: Payer: Self-pay | Admitting: Family Medicine

## 2017-11-12 ENCOUNTER — Encounter: Payer: Self-pay | Admitting: Family Medicine

## 2017-11-18 ENCOUNTER — Ambulatory Visit (INDEPENDENT_AMBULATORY_CARE_PROVIDER_SITE_OTHER): Payer: 59 | Admitting: Family Medicine

## 2017-11-18 ENCOUNTER — Other Ambulatory Visit: Payer: Self-pay

## 2017-11-18 ENCOUNTER — Encounter: Payer: Self-pay | Admitting: Family Medicine

## 2017-11-18 VITALS — BP 120/70 | HR 110 | Temp 98.0°F | Resp 16 | Ht 73.0 in | Wt 191.0 lb

## 2017-11-18 DIAGNOSIS — K432 Incisional hernia without obstruction or gangrene: Secondary | ICD-10-CM

## 2017-11-18 DIAGNOSIS — K50919 Crohn's disease, unspecified, with unspecified complications: Secondary | ICD-10-CM

## 2017-11-18 DIAGNOSIS — Z9889 Other specified postprocedural states: Secondary | ICD-10-CM

## 2017-11-18 DIAGNOSIS — Z8719 Personal history of other diseases of the digestive system: Secondary | ICD-10-CM

## 2017-11-18 DIAGNOSIS — R1084 Generalized abdominal pain: Secondary | ICD-10-CM | POA: Diagnosis not present

## 2017-11-18 DIAGNOSIS — K219 Gastro-esophageal reflux disease without esophagitis: Secondary | ICD-10-CM

## 2017-11-18 NOTE — Patient Instructions (Addendum)
Hernia, Adult A hernia is the bulging of an organ or tissue through a weak spot in the muscles of the abdomen (abdominal wall). Hernias develop most often near the navel or groin. There are many kinds of hernias. Common kinds include:  Femoral hernia. This kind of hernia develops under the groin in the upper thigh area.  Inguinal hernia. This kind of hernia develops in the groin or scrotum.  Umbilical hernia. This kind of hernia develops near the navel.  Hiatal hernia. This kind of hernia causes part of the stomach to be pushed up into the chest.  Incisional hernia. This kind of hernia bulges through a scar from an abdominal surgery.  What are the causes? This condition may be caused by:  Heavy lifting.  Coughing over a long period of time.  Straining to have a bowel movement.  An incision made during an abdominal surgery.  A birth defect (congenital defect).  Excess weight or obesity.  Smoking.  Poor nutrition.  Cystic fibrosis.  Excess fluid in the abdomen.  Undescended testicles.  What are the signs or symptoms? Symptoms of a hernia include:  A lump on the abdomen. This is the first sign of a hernia. The lump may become more obvious with standing, straining, or coughing. It may get bigger over time if it is not treated or if the condition causing it is not treated.  Pain. A hernia is usually painless, but it may become painful over time if treatment is delayed. The pain is usually dull and may get worse with standing or lifting heavy objects.  Sometimes a hernia gets tightly squeezed in the weak spot (strangulated) or stuck there (incarcerated) and causes additional symptoms. These symptoms may include:  Vomiting.  Nausea.  Constipation.  Irritability.  How is this diagnosed? A hernia may be diagnosed with:  A physical exam. During the exam your health care provider may ask you to cough or to make a specific movement, because a hernia is usually more  visible when you move.  Imaging tests. These can include: ? X-rays. ? Ultrasound. ? CT scan.  How is this treated? A hernia that is small and painless may not need to be treated. A hernia that is large or painful may be treated with surgery. Inguinal hernias may be treated with surgery to prevent incarceration or strangulation. Strangulated hernias are always treated with surgery, because lack of blood to the trapped organ or tissue can cause it to die. Surgery to treat a hernia involves pushing the bulge back into place and repairing the weak part of the abdomen. Follow these instructions at home:  Avoid straining.  Do not lift anything heavier than 10 lb (4.5 kg).  Lift with your leg muscles, not your back muscles. This helps avoid strain.  When coughing, try to cough gently.  Prevent constipation. Constipation leads to straining with bowel movements, which can make a hernia worse or cause a hernia repair to break down. You can prevent constipation by: ? Eating a high-fiber diet that includes plenty of fruits and vegetables. ? Drinking enough fluids to keep your urine clear or pale yellow. Aim to drink 6-8 glasses of water per day. ? Using a stool softener as directed by your health care provider.  Lose weight, if you are overweight.  Do not use any tobacco products, including cigarettes, chewing tobacco, or electronic cigarettes. If you need help quitting, ask your health care provider.  Keep all follow-up visits as directed by your health care  provider. This is important. Your health care provider may need to monitor your condition. Contact a health care provider if:  You have swelling, redness, and pain in the affected area.  Your bowel habits change. Get help right away if:  You have a fever.  You have abdominal pain that is getting worse.  You feel nauseous or you vomit.  You cannot push the hernia back in place by gently pressing on it while you are lying  down.  The hernia: ? Changes in shape or size. ? Is stuck outside the abdomen. ? Becomes discolored. ? Feels hard or tender. This information is not intended to replace advice given to you by your health care provider. Make sure you discuss any questions you have with your health care provider. Document Released: 06/09/2005 Document Revised: 11/07/2015 Document Reviewed: 04/19/2014 Elsevier Interactive Patient Education  2017 [employer].     IF you received an x-ray today, you will receive an invoice from [facility] Radiology. Please contact [facility] Radiology at [phone] with questions or concerns regarding your invoice.   IF you received labwork today, you will receive an invoice from [facility]. Please contact [facility] at [phone] with questions or concerns regarding your invoice.   Our billing staff will not be able to assist you with questions regarding bills from these companies.  You will be contacted with the lab results as soon as they are available. The fastest way to get your results is to activate your My Chart account. Instructions are located on the last page of this paperwork. If you have not heard from [place] regarding the results in 2 weeks, please contact this office.

## 2017-11-18 NOTE — Progress Notes (Signed)
Subjective:    Patient ID: Leroy Herrera, male    DOB: 1981-06-03, 37 y.o.   MRN: [id number]  11/18/2017  Mass (pt states he noticed he has another lump around his navel about 2 months again. )    HPI This 37 y.o. male presents for evaluation of swelling around previous abdominal surgery incision site.  Developed in past two months.  No pain.  Worried about hernia.  Denies fever/chills/sweats. Denies nausea/vomiting/diarrhea/constipation.   Abdominal spells:  Intermittent; swelling; bloated and diffuse.  Cut out spicy; eating better and healthier.  Not eating a ton.  They are improving; less frequent; they were happening frequently. This weekend, first episode in three months.  No specific trigger.    Wife is pregnant eight weeks.   Still smoking a ton.  Knows needs to quit.   Not exercising. Realizes that smoking increased risk of repeat hernia.   Having regular bowel movements; multiple. Eating normally today.   S/p appendectomy and cholelithiasis.   Pancreatitis at age 21; nothing like that.    BP Readings from Last 3 Encounters:  11/18/17 120/70  09/04/17 118/72  08/19/17 122/78   Wt Readings from Last 3 Encounters:  11/18/17 191 lb (86.6 kg)  09/04/17 194 lb (88 kg)  08/19/17 190 lb (86.2 kg)   Immunization History  Administered Date(s) Administered  . Influenza Split 03/25/2012  . Influenza,inj,Quad PF,6+ Mos 03/28/2013, 09/12/2015, 02/19/2016  . PPD Test 06/05/2016  . Pneumococcal Polysaccharide-23 02/19/2016  . Td 06/24/2011    Review of Systems  Constitutional: Negative for activity change, appetite change, chills, diaphoresis, fatigue, fever and unexpected weight change.  HENT: Negative for congestion, dental problem, drooling, ear discharge, ear pain, facial swelling, hearing loss, mouth sores, nosebleeds, postnasal drip, rhinorrhea, sinus pressure, sneezing, sore throat, tinnitus, trouble swallowing and voice change.   Eyes: Negative for photophobia,  pain, discharge, redness, itching and visual disturbance.  Respiratory: Negative for apnea, cough, choking, chest tightness, shortness of breath, wheezing and stridor.   Cardiovascular: Negative for chest pain, palpitations and leg swelling.  Gastrointestinal: Positive for abdominal distention and abdominal pain. Negative for anal bleeding, blood in stool, constipation, diarrhea, nausea and vomiting.  Endocrine: Negative for cold intolerance, heat intolerance, polydipsia, polyphagia and polyuria.  Genitourinary: Negative for decreased urine volume, difficulty urinating, discharge, dysuria, enuresis, flank pain, frequency, genital sores, hematuria, penile pain, penile swelling, scrotal swelling, testicular pain and urgency.  Musculoskeletal: Negative for arthralgias, back pain, gait problem, joint swelling, myalgias, neck pain and neck stiffness.  Skin: Negative for color change, pallor, rash and wound.  Allergic/Immunologic: Negative for environmental allergies, food allergies and immunocompromised state.  Neurological: Negative for dizziness, tremors, seizures, syncope, facial asymmetry, speech difficulty, weakness, light-headedness, numbness and headaches.  Hematological: Negative for adenopathy. Does not bruise/bleed easily.  Psychiatric/Behavioral: Negative for agitation, behavioral problems, confusion, decreased concentration, dysphoric mood, hallucinations, self-injury, sleep disturbance and suicidal ideas. The patient is not nervous/anxious and is not hyperactive.     Past Medical History:  Diagnosis Date  . Arthritis    "joints; mild" (06/10/2017)  . Crohn's colitis ([place])   . Crohn's disease ([place])   . GERD (gastroesophageal reflux disease)   . Heroin addiction ([place]) 06/23/2009   Remission since 2011.  Marland Kitchen History of kidney stones   . Hypertension   . Pancreatitis X 1  . Seasonal allergies    Past Surgical History:  Procedure Laterality Date  . APPENDECTOMY  2000  . COLECTOMY  ~  2000  . COLON  RESECTION Right 07/17/2016   Procedure: LAPAROSCOPIC HAND ASSISTED TERMINAL ILEUM AND RIGHT COLON;  Surgeon: [name], MD;  Location: [facility];  Service: General;  Laterality: Right;  . COLONOSCOPY     multiple, last was in ~ 01/2017 (06/10/2017)  . HERNIA REPAIR    . INCISIONAL HERNIA REPAIR    . INCISIONAL HERNIA REPAIR N/A 06/10/2017   Procedure: OPEN REPAIR REPAIR INCISIONAL HERNIA;  Surgeon: Ralene Ok, MD;  Location: [place];  Service: General;  Laterality: N/A;  . INSERTION OF MESH N/A 06/10/2017   Procedure: INSERTION OF MESH;  Surgeon: Ralene Ok, MD;  Location: [place];  Service: General;  Laterality: N/A;  . LAPAROSCOPIC ABDOMINAL EXPLORATION  ~ 2001   "for scar tissue"  . LAPAROSCOPIC CHOLECYSTECTOMY  2005  . TONSILLECTOMY     Allergies  Allergen Reactions  . Prednisone Anaphylaxis and Other (See Comments)    High dose allergy  . Morphine And Related Hives and Itching  . Vicodin [Hydrocodone-Acetaminophen] Hives and Itching   Current Outpatient Medications on File Prior to Visit  Medication Sig Dispense Refill  . cyclobenzaprine (FLEXERIL) 10 MG tablet Take 0.5-1 tablets (5-10 mg total) by mouth 3 (three) times daily as needed for muscle spasms (May cause drowsiness.). 30 tablet 0  . DEXILANT 60 MG capsule TAKE 1 CAPSULE BY MOUTH  ONCE DAILY (Patient taking differently: Take 60 mg by mouth once daily) 90 capsule 3  . dicyclomine (BENTYL) 20 MG tablet Take 1 tablet (20 mg total) by mouth 2 (two) times daily. (Patient taking differently: Take 20 mg by mouth 2 (two) times daily as needed for spasms. ) 20 tablet 0  . fluticasone (FLONASE) 50 MCG/ACT nasal spray Place 2 sprays into both nostrils daily. (Patient taking differently: Place 1 spray into both nostrils daily as needed for allergies. ) 48 g 3  . Glucosamine-MSM-Hyaluronic Acd (JOINT HEALTH PO) Take 1 tablet by mouth daily.    Marland Kitchen HUMIRA PEN 40 MG/0.4ML PNKT     . ibuprofen (ADVIL,MOTRIN) 200 MG  tablet Take 800 mg by mouth every 6 (six) hours as needed for moderate pain.     Marland Kitchen lisinopril-hydrochlorothiazide (PRINZIDE,ZESTORETIC) 20-25 MG tablet Take 1 tablet by mouth daily. 90 tablet 1  . meloxicam (MOBIC) 15 MG tablet Take 1 tablet (15 mg total) by mouth daily. 30 tablet 0  . Multiple Vitamins-Minerals (MULTIVITAMIN WITH IRON-MINERALS) liquid Take 15 mLs by mouth daily.    . ondansetron (ZOFRAN-ODT) 8 MG disintegrating tablet DISSOLVE 1 TABLET ON THE  TONGUE EVERY 8 HOURS AS  NEEDED FOR NAUSEA 60 tablet 0  . oxyCODONE-acetaminophen (ROXICET) 5-325 MG tablet Take 1 tablet by mouth every 6 (six) hours as needed. 20 tablet 0  . ramelteon (ROZEREM) 8 MG tablet Take 1 tablet (8 mg total) by mouth at bedtime. 30 tablet 5  . ranitidine (ZANTAC) 150 MG tablet Take 300 mg by mouth daily.     . traMADol (ULTRAM) 50 MG tablet Take 1-2 tablets (50-100 mg total) by mouth at bedtime as needed. 30 tablet 0  . varenicline (CHANTIX CONTINUING MONTH PAK) 1 MG tablet Take 1 tablet (1 mg total) by mouth 2 (two) times daily. 180 tablet 0  . varenicline (CHANTIX STARTING MONTH PAK) 0.5 MG X 11 & 1 MG X 42 tablet Take one 0.5 mg tablet by mouth once daily for 3 days, then increase to one 0.5 mg tablet twice daily for 4 days, then increase to one 1 mg tablet twice daily. 53 tablet  0   No current facility-administered medications on file prior to visit.    Social History   Socioeconomic History  . Marital status: Married    Spouse name: Estill Bamberg  . Number of children: 2  . Years of education: Not on file  . Highest education level: Not on file  Occupational History  . Occupation: Cabin crew  Social Needs  . Financial resource strain: Not on file  . Food insecurity:    Worry: Not on file    Inability: Not on file  . Transportation needs:    Medical: Not on file    Non-medical: Not on file  Tobacco Use  . Smoking status: Current Every Day Smoker    Packs/day: 1.00    Years: 20.00    Pack years:  20.00    Types: Cigarettes  . Smokeless tobacco: Never Used  Substance and Sexual Activity  . Alcohol use: No  . Drug use: No    Types: Other-see comments, Heroin    Comment: PREVIOUS HEROIN ADDICTION 2009; RECOVERY; "clean since 01/17/2008" (06/10/2017)  . Sexual activity: Yes  Lifestyle  . Physical activity:    Days per week: Not on file    Minutes per session: Not on file  . Stress: Not on file  Relationships  . Social connections:    Talks on phone: Not on file    Gets together: Not on file    Attends religious service: Not on file    Active member of club or organization: Not on file    Attends meetings of clubs or organizations: Not on file    Relationship status: Not on file  . Intimate partner violence:    Fear of current or ex partner: Not on file    Emotionally abused: Not on file    Physically abused: Not on file    Forced sexual activity: Not on file  Other Topics Concern  . Not on file  Social History Narrative   Marital status: Married x 5 years; second marriage. Divorced from first marriage due to heroin addiction.      Children: one son (58 yo [place]) lives with patient and wife; one daughter Larena Glassman) who lives with mother. Gets every Thursday night; then has Thursday to Sunday the following week.      Lives: with wife, son.      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  Narcotic abuse/overuse/misuse in past.      Exercise: none in 2017      Seatbelt: 100%; no texting.       Family History  Problem Relation Age of Onset  . Diabetes Mother   . Arthritis Mother        knee replacement B  . Diabetes Father   . Hyperlipidemia Father   . Hypertension Father   . COPD Father        non-smoker; mill work.  Marland Kitchen Heart disease Father        CHF  . Hypertension Brother        Objective:    BP 120/70   Pulse (!) 110    Temp 98 F (36.7 C) (Oral)   Resp 16   Ht 6' 1"  (1.854 m)   Wt 191 lb (86.6 kg)   SpO2 95%   BMI  25.20 kg/m  Physical Exam  Constitutional: He is oriented to person, place, and time. He appears well-developed and well-nourished. No distress.  HENT:  Head: Normocephalic and atraumatic.  Right Ear: External ear normal.  Left Ear: External ear normal.  Nose: Nose normal.  Mouth/Throat: Oropharynx is clear and moist.  Eyes: Pupils are equal, round, and reactive to light. Conjunctivae and EOM are normal.  Neck: Normal range of motion. Neck supple. Carotid bruit is not present. No thyromegaly present.  Cardiovascular: Normal rate, regular rhythm, normal heart sounds and intact distal pulses. Exam reveals no gallop and no friction rub.  No murmur heard. Pulmonary/Chest: Effort normal and breath sounds normal. He has no wheezes. He has no rales.  Abdominal: Soft. Bowel sounds are normal. He exhibits no distension and no mass. There is no tenderness. There is no rebound and no guarding.  Musculoskeletal:       Right shoulder: Normal.       Left shoulder: Normal.       Cervical back: Normal.  Lymphadenopathy:    He has no cervical adenopathy.  Neurological: He is alert and oriented to person, place, and time. He has normal reflexes. No cranial nerve deficit. He exhibits normal muscle tone. Coordination normal.  Skin: Skin is warm and dry. No rash noted. He is not diaphoretic.  Psychiatric: He has a normal mood and affect. His behavior is normal. Judgment and thought content normal.   No results found. Depression screen [facility] 2/9 11/18/2017 09/04/2017 08/19/2017 03/30/2017 11/07/2016  Decreased Interest 0 0 0 0 0  Down, Depressed, Hopeless 0 0 0 0 0  PHQ - 2 Score 0 0 0 0 0   Fall Risk  11/18/2017 09/04/2017 08/19/2017 03/30/2017 11/07/2016  Falls in the past year? No No No No No        Assessment & Plan:   1. Incisional hernia, without obstruction or gangrene   2. S/P hernia repair   3.  Crohn's disease with complication, unspecified gastrointestinal tract location ([place])   4. Generalized abdominal pain   5. Gastroesophageal reflux disease without esophagitis     Incisional hernia: New; refer to general surgery.  Abdominal pain: Recurrent; with severe abdominal bloating; episodic in nature; recommend follow-up with Dr. Benson Norway of GI; continue PPI/Dexilant for GERD.  Dietary modification.   Orders Placed This Encounter  Procedures  . Ambulatory referral to General Surgery    Referral Priority:   Routine    Referral Type:   Surgical    Referral Reason:   Specialty Services Required    Referred to Provider:   Ralene Ok, MD    Requested Specialty:   General Surgery    Number of Visits Requested:   1   No orders of the defined types were placed in this encounter.   Return in about 6 months (around 05/21/2018) for  complete physical examiniation JEFF Aurora, D.   Norwood Levo, M.D. Primary Care at [facility] previously Ur[facility] [place] [address] [place], [place]  [address] [phone] phone [phone] fax

## 2017-12-03 ENCOUNTER — Telehealth: Payer: Self-pay | Admitting: Family Medicine

## 2017-12-03 NOTE — Telephone Encounter (Signed)
Referral has been made. Sent note to Referrals with PCP

## 2017-12-03 NOTE — Telephone Encounter (Signed)
Copied from [place] [phone]. Topic: Quick Communication - See Telephone Encounter >> Dec 03, 2017  3:53 PM Boyd Kerbs wrote: CRM for notification. See Telephone encounter for: 12/03/17.  A referral is needed for insurance purposes.  [place] Fax# [phone] Appt. Is 6/21

## 2017-12-04 NOTE — Telephone Encounter (Signed)
Received authorization through [facility] for pt to see Dr. Rosendo Gros at [facility]. Referral authorization number is [id number] is valid from 12/04/17-06/02/18 for 6 months or 6 visits. Faxed to Sarah at fax number provided.

## 2017-12-11 ENCOUNTER — Telehealth: Payer: Self-pay | Admitting: Family Medicine

## 2017-12-11 MED ORDER — METRONIDAZOLE 500 MG PO TABS: 2000.0000 mg | ORAL_TABLET | Freq: Once | ORAL | 0 refills | Status: AC

## 2017-12-11 NOTE — Telephone Encounter (Signed)
Pt advised that medication has been sent to pharmacy for treatment, he verbalized understanding. Pt states he has no further questions or concerns at this time.

## 2017-12-11 NOTE — Telephone Encounter (Signed)
I have sent in Metronidazole to pharmacy for patient to take to treat Trichomoniasis.  Please call PATIENT to advise and to document any questions or concerns.

## 2017-12-11 NOTE — Telephone Encounter (Signed)
Copied from [place] [phone]. Topic: General - Other >> Dec 10, 2017  5:38 PM Valla Leaver wrote: Reason for CRM: Patient's wife Jamal Collin calling because she was diagnosed with Trichomoniasis and was told the patient needs to be tested as well. Patient would like a call back from Dr. Tamala Julian or cma.

## 2017-12-23 ENCOUNTER — Other Ambulatory Visit: Payer: Self-pay | Admitting: General Surgery

## 2017-12-23 DIAGNOSIS — Z9889 Other specified postprocedural states: Secondary | ICD-10-CM

## 2018-01-26 ENCOUNTER — Other Ambulatory Visit: Payer: Self-pay | Admitting: Family Medicine

## 2018-02-02 ENCOUNTER — Other Ambulatory Visit: Payer: Self-pay | Admitting: Family Medicine

## 2018-02-02 NOTE — Telephone Encounter (Signed)
Ondansetron 8 mg refill Last Refill:09/19/17 # 60 No RF Last OV: 08/19/17 PCP: Pt of Dr Thompson Caul  Pharmacy:[facility]

## 2018-02-04 ENCOUNTER — Other Ambulatory Visit: Payer: Self-pay | Admitting: Family Medicine

## 2018-02-04 NOTE — Telephone Encounter (Signed)
Zofran refill Last Refill:09/19/17 # 60 tab/0 Last OV: 11/18/17 JSU:NHRVA; Establish with Carlota Raspberry 03/08/18 Pharmacy: [facility], [place] [phone] (Phone) [phone] (Fax)

## 2018-02-23 ENCOUNTER — Other Ambulatory Visit: Payer: Self-pay | Admitting: Family Medicine

## 2018-03-08 ENCOUNTER — Encounter: Payer: Self-pay | Admitting: Family Medicine

## 2018-03-08 ENCOUNTER — Ambulatory Visit: Payer: 59 | Admitting: Family Medicine

## 2018-03-08 ENCOUNTER — Other Ambulatory Visit: Payer: Self-pay

## 2018-03-08 VITALS — BP 130/89 | HR 90 | Temp 98.3°F | Resp 16 | Ht 72.84 in | Wt 198.0 lb

## 2018-03-08 DIAGNOSIS — Z23 Encounter for immunization: Secondary | ICD-10-CM

## 2018-03-08 DIAGNOSIS — Z716 Tobacco abuse counseling: Secondary | ICD-10-CM | POA: Diagnosis not present

## 2018-03-08 DIAGNOSIS — K50119 Crohn's disease of large intestine with unspecified complications: Secondary | ICD-10-CM

## 2018-03-08 DIAGNOSIS — F17210 Nicotine dependence, cigarettes, uncomplicated: Secondary | ICD-10-CM

## 2018-03-08 DIAGNOSIS — K219 Gastro-esophageal reflux disease without esophagitis: Secondary | ICD-10-CM | POA: Diagnosis not present

## 2018-03-08 DIAGNOSIS — Z720 Tobacco use: Secondary | ICD-10-CM | POA: Diagnosis not present

## 2018-03-08 DIAGNOSIS — I10 Essential (primary) hypertension: Secondary | ICD-10-CM | POA: Diagnosis not present

## 2018-03-08 MED ORDER — LISINOPRIL-HYDROCHLOROTHIAZIDE 20-25 MG PO TABS: 1.0000 | ORAL_TABLET | Freq: Every day | ORAL | 1 refills | Status: DC

## 2018-03-08 NOTE — Patient Instructions (Addendum)
Nice meeting you today.   No change in blood pressure meds at this time. I did check some blood tests, but please return at your convenience for fasting cholesterol tests. That can also be done at a physical in next few months if more convenient.   I am happy to help you with smoking cessation when you are ready.   If you have lab work done today you will be contacted with your lab results within the next 2 weeks.  If you have not heard from [place] then please contact us. The fastest way to get your results is to register for My Chart.   IF you received an x-ray today, you will receive an invoice from [facility]. Please contact [facility] at [phone] with questions or concerns regarding your invoice.   IF you received labwork today, you will receive an invoice from [facility]. Please contact [facility] at [phone] with questions or concerns regarding your invoice.   Our billing staff will not be able to assist you with questions regarding bills from these companies.  You will be contacted with the lab results as soon as they are available. The fastest way to get your results is to activate your My Chart account. Instructions are located on the last page of this paperwork. If you have not heard from [place] regarding the results in 2 weeks, please contact this office.

## 2018-03-08 NOTE — Progress Notes (Signed)
Subjective:  By signing my name below, I, Leroy Herrera, attest that this documentation has been prepared under the direction and in the presence of Wendie Agreste, MD Electronically Signed: Ladene Artist, ED Scribe 03/08/2018 at 4:53 PM.   Patient ID: Leroy Herrera, male    DOB: 1980/10/08, 37 y.o.   MRN: [id number]  Chief Complaint  Patient presents with  . Chronic Conditions    follow-up   . Medication Refill   HPI Leroy Herrera is a 37 y.o. male who presents to Primary Care at [facility] for f/u and med refills. New pt to me. Prev followed by Dr. Tamala Julian. H/o HTN, Crohn's disease, GERD, tobacco abuse. Pt is not fasting; he last ate around lunch.  HTN Lab Results  Component Value Date   CREATININE 1.03 08/19/2017   BP Readings from Last 3 Encounters:  03/08/18 130/89  11/18/17 120/70  09/04/17 118/72  Lisinopril-HCTZ 20-25 mg qd. Pt occasionally checks BP with similar readings. Denies any side-effects.  Crohn's Disease Followed by Dr. Benson Norway. H/o resection. Takes Humira.  GERD Takes dexilant 60 mg qd.  Tobacco Abuse Pt is still smoking. He never tried Chantix; states he's not ready to quit yet.  Health Maintenance Flu: today  Patient Active Problem List   Diagnosis Date Noted  . S/P hernia repair 06/10/2017  . Tobacco abuse 03/31/2017  . Substance abuse in remission ([place]) 12/08/2016  . Crohn's disease involving terminal ileum ([place]) 07/17/2016  . Allergic rhinitis due to pollen 03/21/2016  . GERD (gastroesophageal reflux disease) 03/12/2013  . Essential hypertension, benign 06/07/2012  . Nephrolithiasis 06/07/2012  . Crohn's disease [facility]) s/p resection of distal ileum and right colon 07/17/16 06/07/2012  . Dental caries 06/07/2012  . Abdominal pain 06/07/2012   Past Medical History:  Diagnosis Date  . Arthritis    "joints; mild" (06/10/2017)  . Crohn's colitis ([place])   . Crohn's disease ([place])   . GERD (gastroesophageal reflux disease)   . Heroin addiction  ([place]) 06/23/2009   Remission since 2011.  Mar[facility] Kitchen History of kidney stones   . Hypertension   . Pancreatitis X 1  . Seasonal allergies    Past Surgical History:  Procedure Laterality Date  . APPENDECTOMY  2000  . COLECTOMY  ~ 2000  . COLON RESECTION Right 07/17/2016   Procedure: LAPAROSCOPIC HAND ASSISTED TERMINAL ILEUM AND RIGHT COLON;  Surgeon: [name], MD;  Location: [facility];  Service: General;  Laterality: Right;  . COLONOSCOPY     multiple, last was in ~ 01/2017 (06/10/2017)  . HERNIA REPAIR    . INCISIONAL HERNIA REPAIR    . INCISIONAL HERNIA REPAIR N/A 06/10/2017   Procedure: OPEN REPAIR REPAIR INCISIONAL HERNIA;  Surgeon: Ralene Ok, MD;  Location: [place];  Service: General;  Laterality: N/A;  . INSERTION OF MESH N/A 06/10/2017   Procedure: INSERTION OF MESH;  Surgeon: Ralene Ok, MD;  Location: [place];  Service: General;  Laterality: N/A;  . LAPAROSCOPIC ABDOMINAL EXPLORATION  ~ 2001   "for scar tissue"  . LAPAROSCOPIC CHOLECYSTECTOMY  2005  . TONSILLECTOMY     Allergies  Allergen Reactions  . Prednisone Anaphylaxis and Other (See Comments)    High dose allergy  . Morphine And Related Hives and Itching  . Vicodin [Hydrocodone-Acetaminophen] Hives and Itching   Prior to Admission medications   Medication Sig Start Date End Date Taking? Authorizing Provider  cyclobenzaprine (FLEXERIL) 10 MG tablet Take 0.5-1 tablets (5-10 mg total) by mouth 3 (three) times  daily as needed for muscle spasms (May cause drowsiness.). 10/20/16   Tereasa Coop, PA-C  dexlansoprazole (DEXILANT) 60 MG capsule Take 60 mg by mouth once daily 01/26/18   Wardell Honour, MD  dicyclomine (BENTYL) 20 MG tablet Take 1 tablet (20 mg total) by mouth 2 (two) times daily. Patient taking differently: Take 20 mg by mouth 2 (two) times daily as needed for spasms.  11/20/14   Charlesetta Shanks, MD  fluticasone (FLONASE) 50 MCG/ACT nasal spray Place 2 sprays into both nostrils daily. Patient taking  differently: Place 1 spray into both nostrils daily as needed for allergies.  02/19/16   Wardell Honour, MD  Glucosamine-MSM-Hyaluronic Acd (JOINT HEALTH PO) Take 1 tablet by mouth daily.    [provider]  HUMIRA PEN 40 MG/0.4ML PNKT  11/04/17   [provider]  ibuprofen (ADVIL,MOTRIN) 200 MG tablet Take 800 mg by mouth every 6 (six) hours as needed for moderate pain.     [provider]  lisinopril-hydrochlorothiazide (PRINZIDE,ZESTORETIC) 20-25 MG tablet Take 1 tablet by mouth daily. 09/04/17   Wardell Honour, MD  lisinopril-hydrochlorothiazide (PRINZIDE,ZESTORETIC) 20-25 MG tablet TAKE 1 TABLET BY MOUTH  DAILY 02/24/18   Wendie Agreste, MD  meloxicam (MOBIC) 15 MG tablet Take 1 tablet (15 mg total) by mouth daily. 10/20/16   Tereasa Coop, PA-C  Multiple Vitamins-Minerals (MULTIVITAMIN WITH IRON-MINERALS) liquid Take 15 mLs by mouth daily.    [provider]  ondansetron (ZOFRAN-ODT) 8 MG disintegrating tablet DISSOLVE 1 TABLET ON THE  TONGUE EVERY 8 HOURS AS  NEEDED FOR NAUSEA 02/08/18   Delia Chimes A, MD  oxyCODONE-acetaminophen (ROXICET) 5-325 MG tablet Take 1 tablet by mouth every 6 (six) hours as needed. 06/12/17 06/12/18  Ralene Ok, MD  ramelteon (ROZEREM) 8 MG tablet Take 1 tablet (8 mg total) by mouth at bedtime. 06/09/16   Wardell Honour, MD  ranitidine (ZANTAC) 150 MG tablet Take 300 mg by mouth daily.     [provider]  traMADol (ULTRAM) 50 MG tablet Take 1-2 tablets (50-100 mg total) by mouth at bedtime as needed. 08/19/17   Wardell Honour, MD  varenicline (CHANTIX CONTINUING MONTH PAK) 1 MG tablet Take 1 tablet (1 mg total) by mouth 2 (two) times daily. 06/10/16   Wardell Honour, MD  varenicline (CHANTIX STARTING MONTH PAK) 0.5 MG X 11 & 1 MG X 42 tablet Take one 0.5 mg tablet by mouth once daily for 3 days, then increase to one 0.5 mg tablet twice daily for 4 days, then increase to one 1 mg tablet twice daily. 06/10/16    Wardell Honour, MD   Social History   Socioeconomic History  . Marital status: Married    Spouse name: Leroy Herrera  . Number of children: 2  . Years of education: Not on file  . Highest education level: Not on file  Occupational History  . Occupation: Cabin crew  Social Needs  . Financial resource strain: Not on file  . Food insecurity:    Worry: Not on file    Inability: Not on file  . Transportation needs:    Medical: Not on file    Non-medical: Not on file  Tobacco Use  . Smoking status: Current Every Day Smoker    Packs/day: 1.00    Years: 20.00    Pack years: 20.00    Types: Cigarettes  . Smokeless tobacco: Never Used  Substance and Sexual Activity  . Alcohol use: No  .  Drug use: No    Types: Other-see comments, Heroin    Comment: PREVIOUS HEROIN ADDICTION 2009; RECOVERY; "clean since 01/17/2008" (06/10/2017)  . Sexual activity: Yes  Lifestyle  . Physical activity:    Days per week: Not on file    Minutes per session: Not on file  . Stress: Not on file  Relationships  . Social connections:    Talks on phone: Not on file    Gets together: Not on file    Attends religious service: Not on file    Active member of club or organization: Not on file    Attends meetings of clubs or organizations: Not on file    Relationship status: Not on file  . Intimate partner violence:    Fear of current or ex partner: Not on file    Emotionally abused: Not on file    Physically abused: Not on file    Forced sexual activity: Not on file  Other Topics Concern  . Not on file  Social History Narrative   Marital status: Married x 5 years; second marriage. Divorced from first marriage due to heroin addiction.      Children: one son (58 yo [place]) lives with patient and wife; one daughter Leroy Herrera) who lives with mother. Gets every Thursday night; then has Thursday to Sunday the following week.      Lives: with wife, son.      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  Narcotic abuse/overuse/misuse in past.      Exercise: none in 2017      Seatbelt: 100%; no texting.       Review of Systems  Constitutional: Negative for fatigue and unexpected weight change.  Eyes: Negative for visual disturbance.  Respiratory: Negative for cough, chest tightness and shortness of breath.   Cardiovascular: Negative for chest pain, palpitations and leg swelling.  Gastrointestinal: Negative for abdominal pain and blood in stool.  Neurological: Negative for dizziness, light-headedness and headaches.      Objective:   Physical Exam  Constitutional: He is oriented to person, place, and time. He appears well-developed and well-nourished.  HENT:  Head: Normocephalic and atraumatic.  Eyes: Pupils are equal, round, and reactive to light. EOM are normal.  Neck: No JVD present. Carotid bruit is not present.  Cardiovascular: Normal rate, regular rhythm and normal heart sounds.  No murmur heard. Pulmonary/Chest: Effort normal and breath sounds normal. He has no rales.  Musculoskeletal: He exhibits no edema.  Neurological: He is alert and oriented to person, place, and time.  Skin: Skin is warm and dry.  Psychiatric: He has a normal mood and affect.  Vitals reviewed.  Vitals:   03/08/18 1637  BP: 130/89  Pulse: 90  Resp: 16  Temp: 98.3 F (36.8 C)  TempSrc: Oral  SpO2: 98%  Weight: 198 lb (89.8 kg)  Height: 6' 0.84" (1.85 m)      Assessment & Plan:   Leroy Herrera is a 37 y.o. male Essential hypertension - Plan: Comprehensive metabolic panel, lisinopril-hydrochlorothiazide (PRINZIDE,ZESTORETIC) 20-25 MG tablet -Borderline control, but will continue same regimen for now.  Labs pending.  Plan for fasting lipid panel at physical within the next 3 months, or sooner as a lab only visit if preferred.  Crohn's disease of colon with  complication (HCC) Gastroesophageal reflux disease, esophagitis presence not specified  -Reports stable symptoms of both.  Has ongoing care with gastroenterology.  No med refills needed at this time.  Needs flu shot - Plan: Flu Vaccine QUAD 36+ mos IM  Encounter for tobacco use cessation counseling   -Pre-contemplative/contemplative at this time.  Did not take Chantix previously as does not appear to be ready to quit.  Potential negative health impacts discussed, but also discussed approach of cessation with recognizing what ever positive benefits he is receiving right now, and finding other resources to provide similar benefit. Over 3 min counseling.   Meds ordered this encounter  Medications  . lisinopril-hydrochlorothiazide (PRINZIDE,ZESTORETIC) 20-25 MG tablet    Sig: Take 1 tablet by mouth daily.    Dispense:  90 tablet    Refill:  1   Patient Instructions   Nice meeting you today.   No change in blood pressure meds at this time. I did check some blood tests, but please return at your convenience for fasting cholesterol tests. That can also be done at a physical in next few months if more convenient.   I am happy to help you with smoking cessation when you are ready.   If you have lab work done today you will be contacted with your lab results within the next 2 weeks.  If you have not heard from [place] then please contact us. The fastest way to get your results is to register for My Chart.   IF you received an x-ray today, you will receive an invoice from [facility]. Please contact [facility] at [phone] with questions or concerns regarding your invoice.   IF you received labwork today, you will receive an invoice from [facility]. Please contact [facility] at [phone] with questions or concerns regarding your invoice.   Our billing staff will not be able to assist you with questions regarding bills from these companies.  You will be contacted with the lab  results as soon as they are available. The fastest way to get your results is to activate your My Chart account. Instructions are located on the last page of this paperwork. If you have not heard from [place] regarding the results in 2 weeks, please contact this office.       I personally performed the services described in this documentation, which was scribed in my presence. The recorded information has been reviewed and considered for accuracy and completeness, addended by me as needed, and agree with information above.  Signed,   Merri Ray, MD Primary Care at [place].  03/08/18 5:07 PM

## 2018-03-09 LAB — COMPREHENSIVE METABOLIC PANEL
ALK PHOS: 150 IU/L — AB (ref 39–117)
ALT: 25 IU/L (ref 0–44)
AST: 19 IU/L (ref 0–40)
Albumin/Globulin Ratio: 2.3 — ABNORMAL HIGH (ref 1.2–2.2)
Albumin: 4.8 g/dL (ref 3.5–5.5)
BILIRUBIN TOTAL: 0.3 mg/dL (ref 0.0–1.2)
BUN/Creatinine Ratio: 11 (ref 9–20)
BUN: 12 mg/dL (ref 6–20)
CHLORIDE: 97 mmol/L (ref 96–106)
CO2: 19 mmol/L — ABNORMAL LOW (ref 20–29)
Calcium: 9.4 mg/dL (ref 8.7–10.2)
Creatinine, Ser: 1.13 mg/dL (ref 0.76–1.27)
GFR calc Af Amer: 95 mL/min/{1.73_m2} (ref >59)
GFR calc non Af Amer: 82 mL/min/{1.73_m2} (ref >59)
GLUCOSE: 84 mg/dL (ref 65–99)
Globulin, Total: 2.1 g/dL (ref 1.5–4.5)
POTASSIUM: 3.9 mmol/L (ref 3.5–5.2)
Sodium: 134 mmol/L (ref 134–144)
Total Protein: 6.9 g/dL (ref 6.0–8.5)

## 2018-03-10 ENCOUNTER — Other Ambulatory Visit: Payer: Self-pay | Admitting: Family Medicine

## 2018-03-23 ENCOUNTER — Encounter: Payer: Self-pay | Admitting: *Deleted

## 2018-04-09 ENCOUNTER — Telehealth: Payer: Self-pay | Admitting: Family Medicine

## 2018-04-22 ENCOUNTER — Other Ambulatory Visit: Payer: Self-pay

## 2018-04-22 ENCOUNTER — Telehealth: Payer: Self-pay | Admitting: Family Medicine

## 2018-04-22 DIAGNOSIS — I10 Essential (primary) hypertension: Secondary | ICD-10-CM

## 2018-04-22 DIAGNOSIS — K50119 Crohn's disease of large intestine with unspecified complications: Secondary | ICD-10-CM

## 2018-04-22 MED ORDER — LISINOPRIL-HYDROCHLOROTHIAZIDE 20-25 MG PO TABS: 1.0000 | ORAL_TABLET | Freq: Every day | ORAL | 1 refills | Status: DC

## 2018-04-22 NOTE — Telephone Encounter (Signed)
Patient's wife Estill Bamberg calling because the script was never picked up at [facility] and needs to be sent to [facility] as soon as possible. Almost out.

## 2018-04-22 NOTE — Telephone Encounter (Signed)
Please review     Copied from [place] [phone]. Topic: Referral - Request for Referral >> Apr 22, 2018  8:56 AM Leroy Herrera wrote: Has patient seen PCP for this complaint? Yes.   *If NO, is insurance requiring patient see PCP for this issue before PCP can refer them? Referral for which specialty: GI  Preferred provider/office: Leroy Herrera ([place]) Reason for referral: Leroy Herrera manages his Chrons disease and he needs a referral for insurance because the old referral expired.

## 2018-04-22 NOTE — Telephone Encounter (Signed)
Spoke to pt's wife - medication they need refilled is Lisinopril Sent from [facility] to [facility]. Advised wife rx sent

## 2018-04-22 NOTE — Progress Notes (Signed)
Pt req referral to Dr. Benson Norway Prior pt and prior referral Per his ins, referral has expired. Referral placed in chart.

## 2018-04-22 NOTE — Telephone Encounter (Signed)
Referral placed in chart

## 2018-05-31 ENCOUNTER — Other Ambulatory Visit: Payer: Self-pay

## 2018-05-31 ENCOUNTER — Ambulatory Visit (INDEPENDENT_AMBULATORY_CARE_PROVIDER_SITE_OTHER): Payer: 59 | Admitting: Family Medicine

## 2018-05-31 ENCOUNTER — Encounter: Payer: Self-pay | Admitting: Family Medicine

## 2018-05-31 VITALS — BP 127/87 | HR 101 | Temp 99.4°F | Resp 17 | Ht 72.08 in | Wt 199.8 lb

## 2018-05-31 DIAGNOSIS — Z0001 Encounter for general adult medical examination with abnormal findings: Secondary | ICD-10-CM | POA: Diagnosis not present

## 2018-05-31 DIAGNOSIS — Z13220 Encounter for screening for lipoid disorders: Secondary | ICD-10-CM | POA: Diagnosis not present

## 2018-05-31 DIAGNOSIS — R000 Tachycardia, unspecified: Secondary | ICD-10-CM | POA: Diagnosis not present

## 2018-05-31 DIAGNOSIS — Z131 Encounter for screening for diabetes mellitus: Secondary | ICD-10-CM | POA: Diagnosis not present

## 2018-05-31 DIAGNOSIS — Z0000 Encounter for general adult medical examination without abnormal findings: Secondary | ICD-10-CM

## 2018-05-31 DIAGNOSIS — I10 Essential (primary) hypertension: Secondary | ICD-10-CM | POA: Diagnosis not present

## 2018-05-31 LAB — POCT URINALYSIS DIP (MANUAL ENTRY)
BILIRUBIN UA: NEGATIVE mg/dL
Bilirubin, UA: NEGATIVE
Blood, UA: NEGATIVE
GLUCOSE UA: NEGATIVE mg/dL
LEUKOCYTES UA: NEGATIVE
Nitrite, UA: NEGATIVE
PROTEIN UA: NEGATIVE mg/dL
Spec Grav, UA: >=1.03 — AB (ref 1.010–1.025)
Urobilinogen, UA: 0.2 E.U./dL
pH, UA: 5.5 (ref 5.0–8.0)

## 2018-05-31 NOTE — Progress Notes (Signed)
Subjective:    Patient ID: Leroy Herrera, male    DOB: [birth year], 37 y.o.   MRN: [id number]  HPI Leroy Herrera is a 37 y.o. male Presents today for: Chief Complaint  Patient presents with  . Annual Exam    cpe  Was seen in September to establish care with me.  Previous patient of Dr. Tamala Julian.  History of GERD, Crohn's disease, GERD, tobacco abuse.  Tobacco abuse: Contemplative/pre-contemplative stage at last visit.   1ppd, thoughts on quitting. "Ready but not ready to quit".   Can't see much benefit in smoking- appears more chemical issue - has Chantix.   Hypertension: BP Readings from Last 3 Encounters:  05/31/18 127/87  03/08/18 130/89  11/18/17 120/70   Lab Results  Component Value Date   CREATININE 1.13 03/08/2018  Borderline control last visit, continued same regimen.  No new side effects on meds.   History of Crohn's disease, has ongoing care with gastroenterology and reportedly Crohn's and GERD symptoms were stable last visit.  Gastroenterologist is Dr. Benson Norway. Sx's stable on current meds.   Immunizations: Immunization History  Administered Date(s) Administered  . Influenza Split 03/25/2012  . Influenza,inj,Quad PF,6+ Mos 03/28/2013, 09/12/2015, 02/19/2016, 03/08/2018  . PPD Test 06/05/2016  . Pneumococcal Polysaccharide-23 02/19/2016  . Td 06/24/2011    Depression screen: Depression screen [facility] 2/9 05/31/2018 03/08/2018 11/18/2017 09/04/2017 08/19/2017  Decreased Interest 0 0 0 0 0  Down, Depressed, Hopeless 0 0 0 0 0  PHQ - 2 Score 0 0 0 0 0   Vision screen  Visual Acuity Screening   Right eye Left eye Both eyes  Without correction:     With correction: 20/20 20/20 20/20   last optho visit last year. 1 yr follow up.   Dentist: False upper and lower teeth. No recent dental eval.   Exercise:  No regular exercise  Time component. 4 yo son, no regular exercise outside of time with kids  Declines STI testing.   Lipid screening/hyperlipidemia.   No  recent testing, levels from 2017 below.  Not fasting today.  Lab Results  Component Value Date   CHOL 211 (H) 02/19/2016   HDL 35 (L) 02/19/2016   LDLCALC 102 02/19/2016   TRIG 369 (H) 02/19/2016   CHOLHDL 6.0 (H) 02/19/2016       Patient Active Problem List   Diagnosis Date Noted  . S/P hernia repair 06/10/2017  . Tobacco abuse 03/31/2017  . Substance abuse in remission ([place]) 12/08/2016  . Crohn's disease involving terminal ileum ([place]) 07/17/2016  . Allergic rhinitis due to pollen 03/21/2016  . GERD (gastroesophageal reflux disease) 03/12/2013  . Essential hypertension, benign 06/07/2012  . Nephrolithiasis 06/07/2012  . Crohn's disease [facility]) s/p resection of distal ileum and right colon 07/17/16 06/07/2012  . Dental caries 06/07/2012  . Abdominal pain 06/07/2012   Past Medical History:  Diagnosis Date  . Arthritis    "joints; mild" (06/10/2017)  . Crohn's colitis ([place])   . Crohn's disease ([place])   . GERD (gastroesophageal reflux disease)   . Heroin addiction ([place]) 06/23/2009   Remission since 2011.  Marland Kitchen History of kidney stones   . Hypertension   . Pancreatitis X 1  . Seasonal allergies    Past Surgical History:  Procedure Laterality Date  . APPENDECTOMY  2000  . COLECTOMY  ~ 2000  . COLON RESECTION Right 07/17/2016   Procedure: LAPAROSCOPIC HAND ASSISTED TERMINAL ILEUM AND RIGHT COLON;  Surgeon: [name], MD;  Location: [facility];  Service: General;  Laterality: Right;  . COLONOSCOPY     multiple, last was in ~ 01/2017 (06/10/2017)  . HERNIA REPAIR    . INCISIONAL HERNIA REPAIR    . INCISIONAL HERNIA REPAIR N/A 06/10/2017   Procedure: OPEN REPAIR REPAIR INCISIONAL HERNIA;  Surgeon: Ralene Ok, MD;  Location: [place];  Service: General;  Laterality: N/A;  . INSERTION OF MESH N/A 06/10/2017   Procedure: INSERTION OF MESH;  Surgeon: Ralene Ok, MD;  Location: [place];  Service: General;  Laterality: N/A;  . LAPAROSCOPIC ABDOMINAL EXPLORATION  ~ 2001   "for  scar tissue"  . LAPAROSCOPIC CHOLECYSTECTOMY  2005  . TONSILLECTOMY     Allergies  Allergen Reactions  . Prednisone Anaphylaxis and Other (See Comments)    High dose allergy  . Morphine And Related Hives and Itching  . Vicodin [Hydrocodone-Acetaminophen] Hives and Itching   Prior to Admission medications   Medication Sig Start Date End Date Taking? Authorizing Provider  dexlansoprazole (DEXILANT) 60 MG capsule Take 60 mg by mouth once daily 01/26/18  Yes Wardell Honour, MD  dicyclomine (BENTYL) 20 MG tablet Take 1 tablet (20 mg total) by mouth 2 (two) times daily. Patient taking differently: Take 20 mg by mouth 2 (two) times daily as needed for spasms.  11/20/14  Yes Charlesetta Shanks, MD  fluticasone (FLONASE) 50 MCG/ACT nasal spray Place 2 sprays into both nostrils daily. Patient taking differently: Place 1 spray into both nostrils daily as needed for allergies.  02/19/16  Yes Wardell Honour, MD  Glucosamine-MSM-Hyaluronic Acd (JOINT HEALTH PO) Take 1 tablet by mouth daily.   Yes [provider]  HUMIRA PEN 40 MG/0.4ML PNKT  11/04/17  Yes [provider]  ibuprofen (ADVIL,MOTRIN) 200 MG tablet Take 800 mg by mouth every 6 (six) hours as needed for moderate pain.    Yes [provider]  Multiple Vitamins-Minerals (MULTIVITAMIN WITH IRON-MINERALS) liquid Take 15 mLs by mouth daily.   Yes [provider]  ondansetron (ZOFRAN-ODT) 8 MG disintegrating tablet DISSOLVE 1 TABLET ON THE  TONGUE EVERY 8 HOURS AS  NEEDED FOR NAUSEA 02/08/18  Yes Stallings, Zoe A, MD  ranitidine (ZANTAC) 150 MG tablet Take 300 mg by mouth daily.    Yes [provider]   Social History   Socioeconomic History  . Marital status: Married    Spouse name: Estill Bamberg  . Number of children: 2  . Years of education: Not on file  . Highest education level: Not on file  Occupational History  . Occupation: Cabin crew  Social Needs  . Financial resource strain: Not on file  . Food  insecurity:    Worry: Not on file    Inability: Not on file  . Transportation needs:    Medical: Not on file    Non-medical: Not on file  Tobacco Use  . Smoking status: Current Every Day Smoker    Packs/day: 1.00    Years: 20.00    Pack years: 20.00    Types: Cigarettes  . Smokeless tobacco: Never Used  Substance and Sexual Activity  . Alcohol use: No  . Drug use: No    Types: Other-see comments, Heroin    Comment: PREVIOUS HEROIN ADDICTION 2009; RECOVERY; "clean since 01/17/2008" (06/10/2017)  . Sexual activity: Yes  Lifestyle  . Physical activity:    Days per week: Not on file    Minutes per session: Not on file  . Stress: Not on file  Relationships  . Social connections:  Talks on phone: Not on file    Gets together: Not on file    Attends religious service: Not on file    Active member of club or organization: Not on file    Attends meetings of clubs or organizations: Not on file    Relationship status: Not on file  . Intimate partner violence:    Fear of current or ex partner: Not on file    Emotionally abused: Not on file    Physically abused: Not on file    Forced sexual activity: Not on file  Other Topics Concern  . Not on file  Social History Narrative   Marital status: Married x 5 years; second marriage. Divorced from first marriage due to heroin addiction.      Children: one son (64 yo [place]) lives with patient and wife; one daughter Larena Glassman) who lives with mother. Gets every Thursday night; then has Thursday to Sunday the following week.      Lives: with wife, son.      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  Narcotic abuse/overuse/misuse in past.      Exercise: none in 2017      Seatbelt: 100%; no texting.      ' Review of Systems 13 point review of systems per patient health survey noted.  Negative other than  as indicated above or in HPI.      Objective:   Physical Exam  Constitutional: He is oriented to person, place, and time. He appears well-developed and well-nourished.  HENT:  Head: Normocephalic and atraumatic.  Right Ear: External ear normal.  Left Ear: External ear normal.  Mouth/Throat: Oropharynx is clear and moist.  Eyes: Pupils are equal, round, and reactive to light. Conjunctivae and EOM are normal.  Neck: Normal range of motion. Neck supple. No thyromegaly present.  Cardiovascular: Normal rate, regular rhythm, normal heart sounds and intact distal pulses.  Pulmonary/Chest: Effort normal and breath sounds normal. No respiratory distress. He has no wheezes.  Abdominal: Soft. He exhibits no distension. There is no tenderness.  Musculoskeletal: Normal range of motion. He exhibits no edema or tenderness.  Lymphadenopathy:    He has no cervical adenopathy.  Neurological: He is alert and oriented to person, place, and time. He has normal reflexes.  Skin: Skin is warm and dry.  Psychiatric: He has a normal mood and affect. His behavior is normal.  Vitals reviewed.  Vitals:   05/31/18 1323  BP: 127/87  Pulse: (!) 101  Resp: 17  Temp: 99.4 F (37.4 C)  TempSrc: Oral  SpO2: 99%  Weight: 199 lb 12.8 oz (90.6 kg)  Height: 6' 0.08" (1.831 m)        Assessment & Plan:   Leroy Herrera is a 37 y.o. male Annual physical exam  - -anticipatory guidance as below in AVS, screening labs above. Health maintenance items as above in HPI discussed/recommended as applicable.   Screening for diabetes mellitus - Plan: Comprehensive metabolic panel  Screening for hyperlipidemia - Plan: Lipid panel  Tachycardia - Plan: TSH, CBC  -Rare palpitations since childhood.  May be related to nicotine.  Check TSH, CBC, RTC precautions if new/worsening palpitations  Essential hypertension - Plan: POCT urinalysis dipstick  -Stable recheck 6 months.  Tobacco cessation briefly discussed.  See  last visit.  Has Chantix at home when he is ready to start.  Handout on resources given,  benefits of smoking cessation were discussed  No orders of the defined types were placed in this encounter.  Patient Instructions   Please return within the next 1 week for fasting lab work.  Thank you for coming in today.  1-800-quit now to help with quitting smoking.   Steps to Quit Smoking Smoking tobacco can be harmful to your health and can affect almost every organ in your body. Smoking puts you, and those around you, at risk for developing many serious chronic diseases. Quitting smoking is difficult, but it is one of the best things that you can do for your health. It is never too late to quit. What are the benefits of quitting smoking? When you quit smoking, you lower your risk of developing serious diseases and conditions, such as:  Lung cancer or lung disease, such as COPD.  Heart disease.  Stroke.  Heart attack.  Infertility.  Osteoporosis and bone fractures.  Additionally, symptoms such as coughing, wheezing, and shortness of breath may get better when you quit. You may also find that you get sick less often because your body is stronger at fighting off colds and infections. If you are pregnant, quitting smoking can help to reduce your chances of having a baby of low birth weight. How do I get ready to quit? When you decide to quit smoking, create a plan to make sure that you are successful. Before you quit:  Pick a date to quit. Set a date within the next two weeks to give you time to prepare.  Write down the reasons why you are quitting. Keep this list in places where you will see it often, such as on your bathroom mirror or in your car or wallet.  Identify the people, places, things, and activities that make you want to smoke (triggers) and avoid them. Make sure to take these actions: ? Throw away all cigarettes at home, at work, and in your car. ? Throw away smoking  accessories, such as Scientist, research (medical). ? Clean your car and make sure to empty the ashtray. ? Clean your home, including curtains and carpets.  Tell your family, friends, and coworkers that you are quitting. Support from your loved ones can make quitting easier.  Talk with your health care provider about your options for quitting smoking.  Find out what treatment options are covered by your health insurance.  What strategies can I use to quit smoking? Talk with your healthcare provider about different strategies to quit smoking. Some strategies include:  Quitting smoking altogether instead of gradually lessening how much you smoke over a period of time. Research shows that quitting "cold [place]" is more successful than gradually quitting.  Attending in-person counseling to help you build problem-solving skills. You are more likely to have success in quitting if you attend several counseling sessions. Even short sessions of 10 minutes can be effective.  Finding resources and support systems that can help you to quit smoking and remain smoke-free after you quit. These resources are most helpful when you use them often. They can include: ? Online chats with a Social worker. ? Telephone quitlines. ? Careers information officer. ? Support groups or group counseling. ? Text messaging programs. ? Mobile phone applications.  Taking medicines to help you quit smoking. (If you are pregnant or breastfeeding, talk with your health care provider first.) Some medicines contain nicotine and some do  not. Both types of medicines help with cravings, but the medicines that include nicotine help to relieve withdrawal symptoms. Your health care provider may recommend: ? Nicotine patches, gum, or lozenges. ? Nicotine inhalers or sprays. ? Non-nicotine medicine that is taken by mouth.  Talk with your health care provider about combining strategies, such as taking medicines while you are also receiving  in-person counseling. Using these two strategies together makes you more likely to succeed in quitting than if you used either strategy on its own. If you are pregnant or breastfeeding, talk with your health care provider about finding counseling or other support strategies to quit smoking. Do not take medicine to help you quit smoking unless told to do so by your health care provider. What things can I do to make it easier to quit? Quitting smoking might feel overwhelming at first, but there is a lot that you can do to make it easier. Take these important actions:  Reach out to your family and friends and ask that they support and encourage you during this time. Call telephone quitlines, reach out to support groups, or work with a counselor for support.  Ask people who smoke to avoid smoking around you.  Avoid places that trigger you to smoke, such as bars, parties, or smoke-break areas at work.  Spend time around people who do not smoke.  Lessen stress in your life, because stress can be a smoking trigger for some people. To lessen stress, try: ? Exercising regularly. ? Deep-breathing exercises. ? Yoga. ? Meditating. ? Performing a body scan. This involves closing your eyes, scanning your body from head to toe, and noticing which parts of your body are particularly tense. Purposefully relax the muscles in those areas.  Download or purchase mobile phone or tablet apps (applications) that can help you stick to your quit plan by providing reminders, tips, and encouragement. There are many free apps, such as QuitGuide from the [employer] Office manager for Disease Control and Prevention). You can find other support for quitting smoking (smoking cessation) through smokefree.gov and other websites.  How will I feel when I quit smoking? Within the first 24 hours of quitting smoking, you may start to feel some withdrawal symptoms. These symptoms are usually most noticeable 2-3 days after quitting, but they  usually do not last beyond 2-3 weeks. Changes or symptoms that you might experience include:  Mood swings.  Restlessness, anxiety, or irritation.  Difficulty concentrating.  Dizziness.  Strong cravings for sugary foods in addition to nicotine.  Mild weight gain.  Constipation.  Nausea.  Coughing or a sore throat.  Changes in how your medicines work in your body.  A depressed mood.  Difficulty sleeping (insomnia).  After the first 2-3 weeks of quitting, you may start to notice more positive results, such as:  Improved sense of smell and taste.  Decreased coughing and sore throat.  Slower heart rate.  Lower blood pressure.  Clearer skin.  The ability to breathe more easily.  Fewer sick days.  Quitting smoking is very challenging for most people. Do not get discouraged if you are not successful the first time. Some people need to make many attempts to quit before they achieve long-term success. Do your best to stick to your quit plan, and talk with your health care provider if you have any questions or concerns. This information is not intended to replace advice given to you by your health care provider. Make sure you discuss any questions you have with your  health care provider. Document Released: 06/03/2001 Document Revised: 02/05/2016 Document Reviewed: 10/24/2014 Elsevier Interactive Patient Education  [employer].   If you have lab work done today you will be contacted with your lab results within the next 2 weeks.  If you have not heard from [place] then please contact us. The fastest way to get your results is to register for My Chart.   IF you received an x-ray today, you will receive an invoice from [facility]. Please contact [facility] at [phone] with questions or concerns regarding your invoice.   IF you received labwork today, you will receive an invoice from [facility]. Please contact [facility] at [phone] with questions or  concerns regarding your invoice.   Our billing staff will not be able to assist you with questions regarding bills from these companies.  You will be contacted with the lab results as soon as they are available. The fastest way to get your results is to activate your My Chart account. Instructions are located on the last page of this paperwork. If you have not heard from [place] regarding the results in 2 weeks, please contact this office.        Signed,   Merri Ray, MD [facility] [place].  05/31/18 2:01 PM

## 2018-05-31 NOTE — Patient Instructions (Addendum)
Please return within the next 1 week for fasting lab work.  Thank you for coming in today.  1-800-quit now to help with quitting smoking.   Steps to Quit Smoking Smoking tobacco can be harmful to your health and can affect almost every organ in your body. Smoking puts you, and those around you, at risk for developing many serious chronic diseases. Quitting smoking is difficult, but it is one of the best things that you can do for your health. It is never too late to quit. What are the benefits of quitting smoking? When you quit smoking, you lower your risk of developing serious diseases and conditions, such as:  Lung cancer or lung disease, such as COPD.  Heart disease.  Stroke.  Heart attack.  Infertility.  Osteoporosis and bone fractures.  Additionally, symptoms such as coughing, wheezing, and shortness of breath may get better when you quit. You may also find that you get sick less often because your body is stronger at fighting off colds and infections. If you are pregnant, quitting smoking can help to reduce your chances of having a baby of low birth weight. How do I get ready to quit? When you decide to quit smoking, create a plan to make sure that you are successful. Before you quit:  Pick a date to quit. Set a date within the next two weeks to give you time to prepare.  Write down the reasons why you are quitting. Keep this list in places where you will see it often, such as on your bathroom mirror or in your car or wallet.  Identify the people, places, things, and activities that make you want to smoke (triggers) and avoid them. Make sure to take these actions: ? Throw away all cigarettes at home, at work, and in your car. ? Throw away smoking accessories, such as Scientist, research (medical). ? Clean your car and make sure to empty the ashtray. ? Clean your home, including curtains and carpets.  Tell your family, friends, and coworkers that you are quitting. Support from your  loved ones can make quitting easier.  Talk with your health care provider about your options for quitting smoking.  Find out what treatment options are covered by your health insurance.  What strategies can I use to quit smoking? Talk with your healthcare provider about different strategies to quit smoking. Some strategies include:  Quitting smoking altogether instead of gradually lessening how much you smoke over a period of time. Research shows that quitting "cold [place]" is more successful than gradually quitting.  Attending in-person counseling to help you build problem-solving skills. You are more likely to have success in quitting if you attend several counseling sessions. Even short sessions of 10 minutes can be effective.  Finding resources and support systems that can help you to quit smoking and remain smoke-free after you quit. These resources are most helpful when you use them often. They can include: ? Online chats with a Social worker. ? Telephone quitlines. ? Careers information officer. ? Support groups or group counseling. ? Text messaging programs. ? Mobile phone applications.  Taking medicines to help you quit smoking. (If you are pregnant or breastfeeding, talk with your health care provider first.) Some medicines contain nicotine and some do not. Both types of medicines help with cravings, but the medicines that include nicotine help to relieve withdrawal symptoms. Your health care provider may recommend: ? Nicotine patches, gum, or lozenges. ? Nicotine inhalers or sprays. ? Non-nicotine medicine that is taken  by mouth.  Talk with your health care provider about combining strategies, such as taking medicines while you are also receiving in-person counseling. Using these two strategies together makes you more likely to succeed in quitting than if you used either strategy on its own. If you are pregnant or breastfeeding, talk with your health care provider about finding  counseling or other support strategies to quit smoking. Do not take medicine to help you quit smoking unless told to do so by your health care provider. What things can I do to make it easier to quit? Quitting smoking might feel overwhelming at first, but there is a lot that you can do to make it easier. Take these important actions:  Reach out to your family and friends and ask that they support and encourage you during this time. Call telephone quitlines, reach out to support groups, or work with a counselor for support.  Ask people who smoke to avoid smoking around you.  Avoid places that trigger you to smoke, such as bars, parties, or smoke-break areas at work.  Spend time around people who do not smoke.  Lessen stress in your life, because stress can be a smoking trigger for some people. To lessen stress, try: ? Exercising regularly. ? Deep-breathing exercises. ? Yoga. ? Meditating. ? Performing a body scan. This involves closing your eyes, scanning your body from head to toe, and noticing which parts of your body are particularly tense. Purposefully relax the muscles in those areas.  Download or purchase mobile phone or tablet apps (applications) that can help you stick to your quit plan by providing reminders, tips, and encouragement. There are many free apps, such as QuitGuide from the [employer] Office manager for Disease Control and Prevention). You can find other support for quitting smoking (smoking cessation) through smokefree.gov and other websites.  How will I feel when I quit smoking? Within the first 24 hours of quitting smoking, you may start to feel some withdrawal symptoms. These symptoms are usually most noticeable 2-3 days after quitting, but they usually do not last beyond 2-3 weeks. Changes or symptoms that you might experience include:  Mood swings.  Restlessness, anxiety, or irritation.  Difficulty concentrating.  Dizziness.  Strong cravings for sugary foods in addition  to nicotine.  Mild weight gain.  Constipation.  Nausea.  Coughing or a sore throat.  Changes in how your medicines work in your body.  A depressed mood.  Difficulty sleeping (insomnia).  After the first 2-3 weeks of quitting, you may start to notice more positive results, such as:  Improved sense of smell and taste.  Decreased coughing and sore throat.  Slower heart rate.  Lower blood pressure.  Clearer skin.  The ability to breathe more easily.  Fewer sick days.  Quitting smoking is very challenging for most people. Do not get discouraged if you are not successful the first time. Some people need to make many attempts to quit before they achieve long-term success. Do your best to stick to your quit plan, and talk with your health care provider if you have any questions or concerns. This information is not intended to replace advice given to you by your health care provider. Make sure you discuss any questions you have with your health care provider. Document Released: 06/03/2001 Document Revised: 02/05/2016 Document Reviewed: 10/24/2014 Elsevier Interactive Patient Education  [employer].   If you have lab work done today you will be contacted with your lab results within the next 2 weeks.  If you have not heard from [place] then please contact us. The fastest way to get your results is to register for My Chart.   IF you received an x-ray today, you will receive an invoice from [facility]. Please contact [facility] at [phone] with questions or concerns regarding your invoice.   IF you received labwork today, you will receive an invoice from [facility]. Please contact [facility] at [phone] with questions or concerns regarding your invoice.   Our billing staff will not be able to assist you with questions regarding bills from these companies.  You will be contacted with the lab results as soon as they are available. The fastest way to get your  results is to activate your My Chart account. Instructions are located on the last page of this paperwork. If you have not heard from [place] regarding the results in 2 weeks, please contact this office.

## 2018-06-07 ENCOUNTER — Ambulatory Visit (INDEPENDENT_AMBULATORY_CARE_PROVIDER_SITE_OTHER): Payer: 59 | Admitting: Family Medicine

## 2018-06-07 DIAGNOSIS — R000 Tachycardia, unspecified: Secondary | ICD-10-CM

## 2018-06-07 DIAGNOSIS — Z131 Encounter for screening for diabetes mellitus: Secondary | ICD-10-CM

## 2018-06-07 DIAGNOSIS — Z13220 Encounter for screening for lipoid disorders: Secondary | ICD-10-CM

## 2018-06-08 LAB — COMPREHENSIVE METABOLIC PANEL
ALT: 22 IU/L (ref 0–44)
AST: 19 IU/L (ref 0–40)
Albumin/Globulin Ratio: 2.3 — ABNORMAL HIGH (ref 1.2–2.2)
Albumin: 4.6 g/dL (ref 3.5–5.5)
Alkaline Phosphatase: 135 IU/L — ABNORMAL HIGH (ref 39–117)
BUN/Creatinine Ratio: 11 (ref 9–20)
BUN: 13 mg/dL (ref 6–20)
Bilirubin Total: 0.2 mg/dL (ref 0.0–1.2)
CO2: 20 mmol/L (ref 20–29)
Calcium: 9.2 mg/dL (ref 8.7–10.2)
Chloride: 103 mmol/L (ref 96–106)
Creatinine, Ser: 1.22 mg/dL (ref 0.76–1.27)
GFR calc Af Amer: 86 mL/min/{1.73_m2} (ref >59)
GFR calc non Af Amer: 74 mL/min/{1.73_m2} (ref >59)
Globulin, Total: 2 g/dL (ref 1.5–4.5)
Glucose: 98 mg/dL (ref 65–99)
Potassium: 4.4 mmol/L (ref 3.5–5.2)
Sodium: 138 mmol/L (ref 134–144)
Total Protein: 6.6 g/dL (ref 6.0–8.5)

## 2018-06-08 LAB — LIPID PANEL
Chol/HDL Ratio: 5.7 ratio — ABNORMAL HIGH (ref 0.0–5.0)
Cholesterol, Total: 228 mg/dL — ABNORMAL HIGH (ref 100–199)
HDL: 40 mg/dL (ref >39)
LDL CALC: 115 mg/dL — AB (ref 0–99)
Triglycerides: 366 mg/dL — ABNORMAL HIGH (ref 0–149)
VLDL Cholesterol Cal: 73 mg/dL — ABNORMAL HIGH (ref 5–40)

## 2018-06-08 LAB — CBC
HEMATOCRIT: 42.5 % (ref 37.5–51.0)
Hemoglobin: 14.6 g/dL (ref 13.0–17.7)
MCH: 28.9 pg (ref 26.6–33.0)
MCHC: 34.4 g/dL (ref 31.5–35.7)
MCV: 84 fL (ref 79–97)
Platelets: 243 10*3/uL (ref 150–450)
RBC: 5.05 x10E6/uL (ref 4.14–5.80)
RDW: 13.1 % (ref 12.3–15.4)
WBC: 9.9 10*3/uL (ref 3.4–10.8)

## 2018-06-08 LAB — TSH: TSH: 1.19 u[IU]/mL (ref 0.450–4.500)

## 2018-06-11 NOTE — Progress Notes (Signed)
Lab visit only.

## 2018-10-20 ENCOUNTER — Other Ambulatory Visit: Payer: Self-pay | Admitting: Family Medicine

## 2018-10-20 DIAGNOSIS — I10 Essential (primary) hypertension: Secondary | ICD-10-CM

## 2018-10-20 NOTE — Telephone Encounter (Signed)
Requested medication (s) are due for refill today: no  Requested medication (s) are on the active medication list: no  Last refill: 07/13/2018  Future visit scheduled: yes  Notes to clinic: d/c'd 07/13/2018    Requested Prescriptions  Pending Prescriptions Disp Refills   lisinopril-hydrochlorothiazide (ZESTORETIC) 20-25 MG tablet [Pharmacy Med Name: LISINOPRIL/HCTZ 20-25MG TABLET] 90 tablet 1    Sig: TAKE 1 TABLET BY MOUTH  DAILY     Cardiovascular:  ACEI + Diuretic Combos Passed - 10/20/2018 10:00 AM      Passed - Na in normal range and within 180 days    Sodium  Date Value Ref Range Status  06/07/2018 138 134 - 144 mmol/L Final         Passed - K in normal range and within 180 days    Potassium  Date Value Ref Range Status  06/07/2018 4.4 3.5 - 5.2 mmol/L Final         Passed - Cr in normal range and within 180 days    Creat  Date Value Ref Range Status  02/19/2016 1.10 0.60 - 1.35 mg/dL Final   Creatinine, Ser  Date Value Ref Range Status  06/07/2018 1.22 0.76 - 1.27 mg/dL Final         Passed - Ca in normal range and within 180 days    Calcium  Date Value Ref Range Status  06/07/2018 9.2 8.7 - 10.2 mg/dL Final         Passed - Patient is not pregnant      Passed - Last BP in normal range    BP Readings from Last 1 Encounters:  05/31/18 127/87         Passed - Valid encounter within last 6 months    Recent Outpatient Visits          4 months ago Tachycardia   [facility], Ranell Patrick, MD   4 months ago Annual physical exam   [facility] at [facility], Ranell Patrick, MD   7 months ago Essential hypertension   [facility], Ranell Patrick, MD   11 months ago Incisional hernia, without obstruction or gangrene   Primary Care at [facility], Renette Butters, MD   1 year ago Generalized abdominal pain   Primary Care at [facility], Renette Butters, MD      Future Appointments            In 1 month Carlota Raspberry Ranell Patrick, MD Primary Care at  [employer], [facility]

## 2018-10-20 NOTE — Telephone Encounter (Signed)
Requested medication (s) are due for refill today: yes  Requested medication (s) are on the active medication list: yes  Last refill:  01/28/18  Future visit scheduled: yes  Notes to clinic: not delegated   Requested Prescriptions  Pending Prescriptions Disp Refills   ondansetron (ZOFRAN-ODT) 8 MG disintegrating tablet [Pharmacy Med Name: ONDANSETRON  8MG  TAB  ODT] 60 tablet 0    Sig: DISSOLVE 1 TABLET ON THE  TONGUE EVERY 8 HOURS AS  NEEDED FOR NAUSEA     Not Delegated - Gastroenterology: Antiemetics Failed - 10/20/2018 10:00 AM      Failed - This refill cannot be delegated      Passed - Valid encounter within last 6 months    Recent Outpatient Visits          4 months ago Tachycardia   [facility] at [facility], Ranell Patrick, MD   4 months ago Annual physical exam   [facility] at [facility], Ranell Patrick, MD   7 months ago Essential hypertension   [facility] at [facility], Ranell Patrick, MD   11 months ago Incisional hernia, without obstruction or gangrene   Primary Care at [facility], Renette Butters, MD   1 year ago Generalized abdominal pain   Primary Care at [facility], Renette Butters, MD      Future Appointments            In 1 month Carlota Raspberry Ranell Patrick, MD Primary Care at [place], [facility]

## 2018-10-25 ENCOUNTER — Telehealth: Payer: Self-pay | Admitting: Family Medicine

## 2018-10-25 DIAGNOSIS — I10 Essential (primary) hypertension: Secondary | ICD-10-CM

## 2018-10-25 NOTE — Telephone Encounter (Signed)
This [facility] [address] not sure while it would not let me send in through system #[id number] with 1 refill

## 2018-10-25 NOTE — Telephone Encounter (Signed)
Requested medication (s) are due for refill today: Yes  Requested medication (s) are on the active medication list: No  Future visit scheduled: Yes  Notes to clinic:  Unable to refill, med removed from list on 05/31/18, patient is still taking and is out of meds, will need a short supply sent to [facility] to cover until [facility] sends this refill     Requested Prescriptions  Pending Prescriptions Disp Refills   lisinopril-hydrochlorothiazide (ZESTORETIC) 20-25 MG tablet [Pharmacy Med Name: LISINOPRIL/HCTZ 20-25MG TABLET] 90 tablet 1    Sig: Take 1 tablet by mouth daily.     Cardiovascular:  ACEI + Diuretic Combos Passed - 10/25/2018  1:26 PM      Passed - Na in normal range and within 180 days    Sodium  Date Value Ref Range Status  06/07/2018 138 134 - 144 mmol/L Final         Passed - K in normal range and within 180 days    Potassium  Date Value Ref Range Status  06/07/2018 4.4 3.5 - 5.2 mmol/L Final         Passed - Cr in normal range and within 180 days    Creat  Date Value Ref Range Status  02/19/2016 1.10 0.60 - 1.35 mg/dL Final   Creatinine, Ser  Date Value Ref Range Status  06/07/2018 1.22 0.76 - 1.27 mg/dL Final         Passed - Ca in normal range and within 180 days    Calcium  Date Value Ref Range Status  06/07/2018 9.2 8.7 - 10.2 mg/dL Final         Passed - Patient is not pregnant      Passed - Last BP in normal range    BP Readings from Last 1 Encounters:  05/31/18 127/87         Passed - Valid encounter within last 6 months    Recent Outpatient Visits          4 months ago Tachycardia   [facility] at [facility], Ranell Patrick, MD   4 months ago Annual physical exam   Primary Care at [place], MD   7 months ago Essential hypertension   [facility] at [facility], Ranell Patrick, MD   11 months ago Incisional hernia, without obstruction or gangrene   Primary Care at [facility], Renette Butters, MD   1 year ago Generalized  abdominal pain   Primary Care at [facility], Renette Butters, MD      Future Appointments            In 1 month Carlota Raspberry Ranell Patrick, MD Primary Care at [place], [facility]

## 2018-10-25 NOTE — Telephone Encounter (Signed)
Pts wife calling to check status. Please advise Cb#[phone]. Pt wife would like a few sent to a local pharmacy because the patient is about to run out. She would like this sent to [address]

## 2018-10-26 NOTE — Telephone Encounter (Signed)
Pt's wife states they are still waiting on this medication.

## 2018-10-26 NOTE — Telephone Encounter (Signed)
[facility] personally [facility] [address]

## 2018-12-01 ENCOUNTER — Ambulatory Visit: Payer: 59 | Admitting: Family Medicine

## 2018-12-22 IMAGING — DX DG LUMBAR SPINE COMPLETE 4+V
5 series · 5 of 5 positions shown · non-contrast
Comparison: CT lumbar spine 02/03/2015.

CLINICAL DATA: 35-year-old presenting with acute midline low back
pain without sciatica. Current history of Crohn's disease which is
controlled on Humira.

EXAM:
LUMBAR SPINE - COMPLETE 4+ VIEW

[l-spine ap]
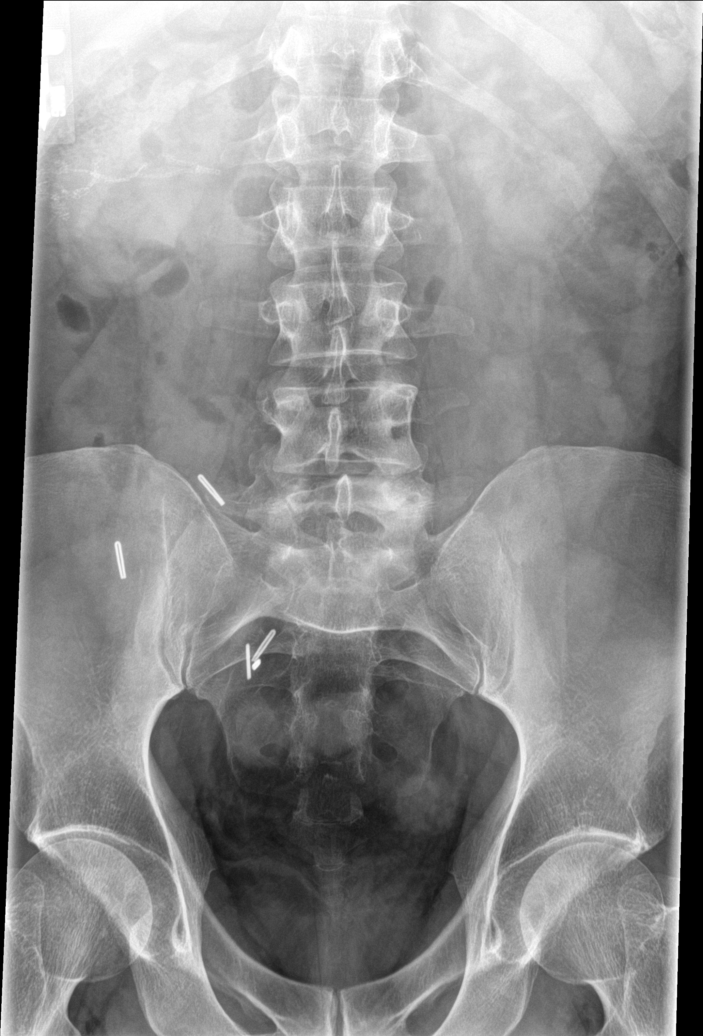

[l-spine obl (1 of 2)]
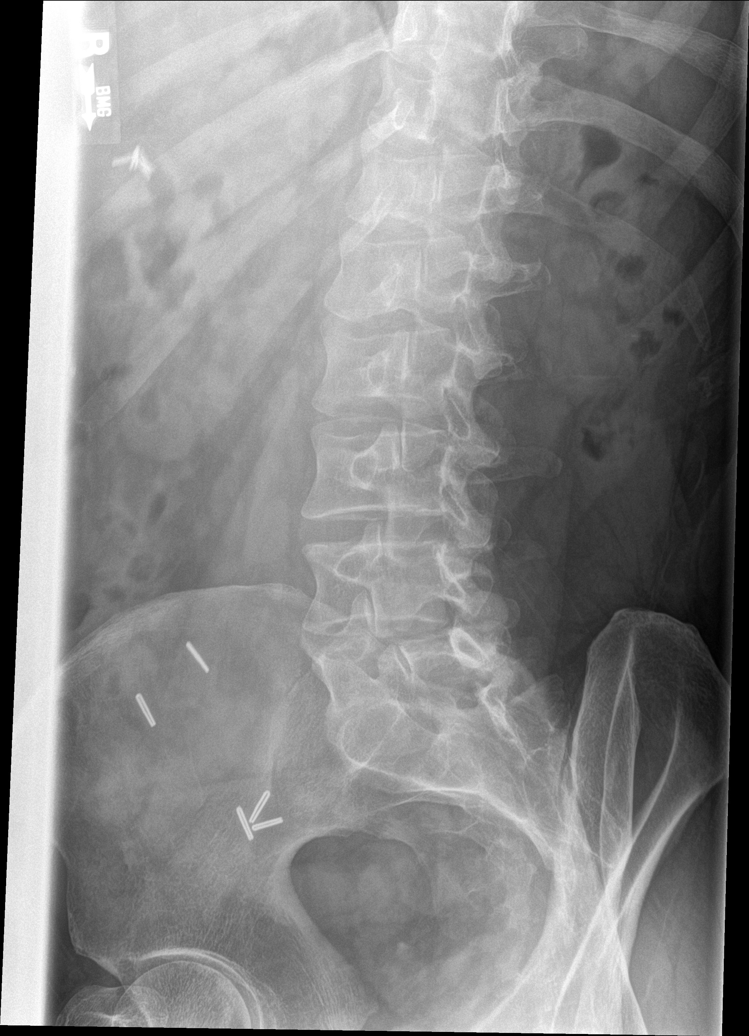

[l-spine obl (2 of 2)]
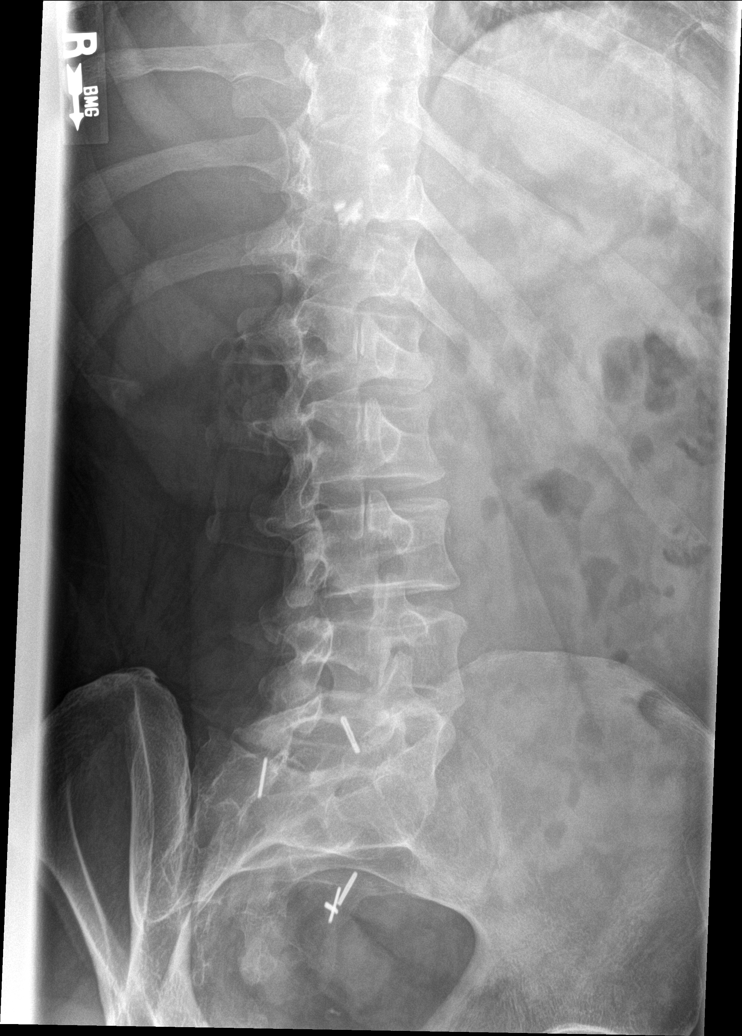

[l-spine lat]
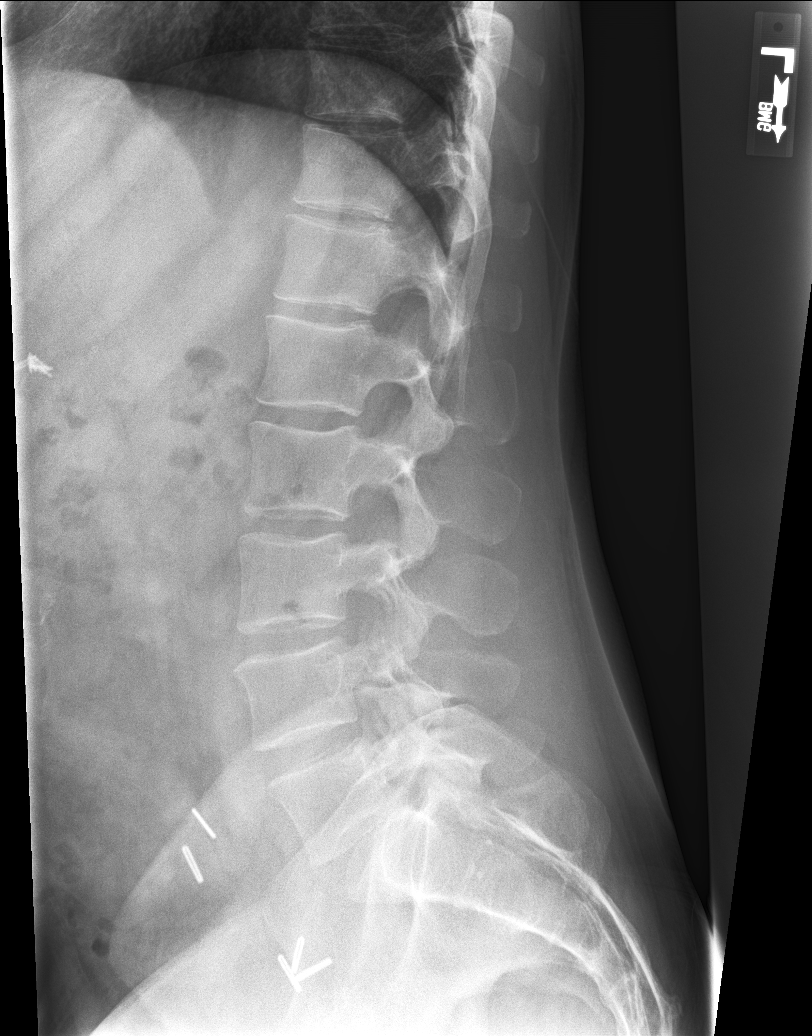

[l-spine l5-s1]
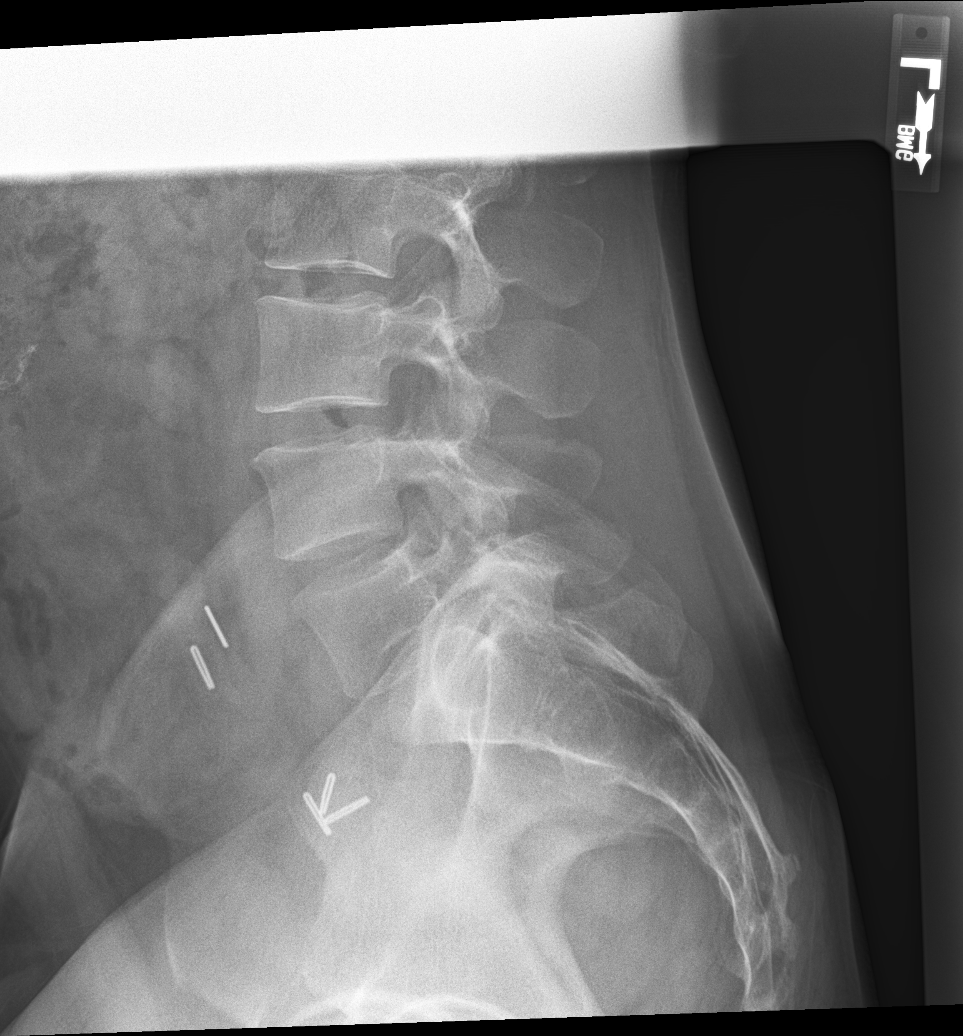

[5 of 5 positions shown; findings below may reference images not displayed]

FINDINGS: Five non-rib-bearing lumbar vertebrae with anatomic alignment. No
fractures. Well-preserved disc spaces. Left L5 pars defect with
associated mild sclerosis, unchanged. No significant facet
arthropathy. No interval change.
IMPRESSION: No acute osseous abnormality. Stable left L5 spondylolysis with
associated mild sclerosis.

## 2019-05-24 ENCOUNTER — Encounter: Payer: Self-pay | Admitting: Podiatry

## 2019-05-24 ENCOUNTER — Ambulatory Visit: Payer: 59 | Admitting: Podiatry

## 2019-05-24 ENCOUNTER — Other Ambulatory Visit: Payer: Self-pay

## 2019-05-24 ENCOUNTER — Ambulatory Visit (INDEPENDENT_AMBULATORY_CARE_PROVIDER_SITE_OTHER): Payer: 59

## 2019-05-24 ENCOUNTER — Ambulatory Visit: Payer: Self-pay

## 2019-05-24 VITALS — BP 130/89 | HR 89

## 2019-05-24 DIAGNOSIS — G5760 Lesion of plantar nerve, unspecified lower limb: Secondary | ICD-10-CM

## 2019-05-24 DIAGNOSIS — G5780 Other specified mononeuropathies of unspecified lower limb: Secondary | ICD-10-CM

## 2019-05-24 DIAGNOSIS — M778 Other enthesopathies, not elsewhere classified: Secondary | ICD-10-CM

## 2019-05-24 DIAGNOSIS — G5761 Lesion of plantar nerve, right lower limb: Secondary | ICD-10-CM | POA: Diagnosis not present

## 2019-05-24 NOTE — Progress Notes (Signed)
Subjective:  Patient ID: [facility] Herrera, male    DOB: 07/10/1980,  MRN: [id number] HPI Chief Complaint  Patient presents with  . Foot Pain    right foot, ball of foot hurts x 2-3 months; it started 7 or 8 months ago in the left foot. Describes it as weird numbness and tingling pain in both feet    38 y.o. male presents with the above complaint.   ROS: Denies fever chills nausea vomiting muscle aches pains calf pain back pain chest pain shortness of breath headache.  Past Medical History:  Diagnosis Date  . Arthritis    "joints; mild" (06/10/2017)  . Crohn's colitis ([place])   . Crohn's disease ([place])   . GERD (gastroesophageal reflux disease)   . Heroin addiction ([place]) 06/23/2009   Remission since 2011.  Marland Kitchen History of kidney stones   . Hypertension   . Pancreatitis X 1  . Seasonal allergies    Past Surgical History:  Procedure Laterality Date  . APPENDECTOMY  2000  . COLECTOMY  ~ 2000  . COLON RESECTION Right 07/17/2016   Procedure: LAPAROSCOPIC HAND ASSISTED TERMINAL ILEUM AND RIGHT COLON;  Surgeon: [name], MD;  Location: [facility];  Service: General;  Laterality: Right;  . COLONOSCOPY     multiple, last was in ~ 01/2017 (06/10/2017)  . HERNIA REPAIR    . INCISIONAL HERNIA REPAIR    . INCISIONAL HERNIA REPAIR N/A 06/10/2017   Procedure: OPEN REPAIR REPAIR INCISIONAL HERNIA;  Surgeon: Ralene Ok, MD;  Location: [facility];  Service: General;  Laterality: N/A;  . INSERTION OF MESH N/A 06/10/2017   Procedure: INSERTION OF MESH;  Surgeon: Ralene Ok, MD;  Location: [facility];  Service: General;  Laterality: N/A;  . LAPAROSCOPIC ABDOMINAL EXPLORATION  ~ 2001   "for scar tissue"  . LAPAROSCOPIC CHOLECYSTECTOMY  2005  . TONSILLECTOMY      Current Outpatient Medications:  .  lisinopril-hydrochlorothiazide (ZESTORETIC) 20-25 MG tablet, Take by mouth., Disp: , Rfl:  .  meloxicam (MOBIC) 15 MG tablet, Take by mouth., Disp: , Rfl:  .  dexlansoprazole (DEXILANT) 60 MG  capsule, Take 60 mg by mouth once daily, Disp: 90 capsule, Rfl: 3 .  dicyclomine (BENTYL) 20 MG tablet, Take 1 tablet (20 mg total) by mouth 2 (two) times daily. (Patient taking differently: Take 20 mg by mouth 2 (two) times daily as needed for spasms. ), Disp: 20 tablet, Rfl: 0 .  fluticasone (FLONASE) 50 MCG/ACT nasal spray, Place 2 sprays into both nostrils daily. (Patient taking differently: Place 1 spray into both nostrils daily as needed for allergies. ), Disp: 48 g, Rfl: 3 .  Glucosamine-MSM-Hyaluronic Acd (JOINT HEALTH PO), Take 1 tablet by mouth daily., Disp: , Rfl:  .  HUMIRA PEN 40 MG/0.4ML PNKT, , Disp: , Rfl:  .  ibuprofen (ADVIL,MOTRIN) 200 MG tablet, Take 800 mg by mouth every 6 (six) hours as needed for moderate pain. , Disp: , Rfl:  .  Multiple Vitamins-Minerals (MULTIVITAMIN WITH IRON-MINERALS) liquid, Take 15 mLs by mouth daily., Disp: , Rfl:  .  ondansetron (ZOFRAN-ODT) 8 MG disintegrating tablet, DISSOLVE 1 TABLET ON THE  TONGUE EVERY 8 HOURS AS  NEEDED FOR NAUSEA, Disp: 60 tablet, Rfl: 0 .  ranitidine (ZANTAC) 150 MG tablet, Take 300 mg by mouth daily. , Disp: , Rfl:   Allergies  Allergen Reactions  . Prednisone Anaphylaxis and Other (See Comments)    High dose allergy  . Morphine And Related Hives and Itching  .  Vicodin [Hydrocodone-Acetaminophen] Hives and Itching   Review of Systems Objective:   Vitals:   05/24/19 1000  BP: 130/89  Pulse: 89    General: Well developed, nourished, in no acute distress, alert and oriented x3   Dermatological: Skin is warm, dry and supple bilateral. Nails x 10 are well maintained; remaining integument appears unremarkable at this time. There are no open sores, no preulcerative lesions, no rash or signs of infection present.  Vascular: Dorsalis Pedis artery and Posterior Tibial artery pedal pulses are 2/4 bilateral with immedate capillary fill time. Pedal hair growth present. No varicosities and no lower extremity edema present  bilateral.   Neruologic: Grossly intact via light touch bilateral. Vibratory intact via tuning fork bilateral. Protective threshold with Semmes Wienstein monofilament intact to all pedal sites bilateral. Patellar and Achilles deep tendon reflexes 2+ bilateral. No Babinski or clonus noted bilateral.  Palpable Mulder's click third interdigital space left over right.  Musculoskeletal: No gross boney pedal deformities bilateral. No pain, crepitus, or limitation noted with foot and ankle range of motion bilateral. Muscular strength 5/5 in all groups tested bilateral.  Gait: Unassisted, Nonantalgic.    Radiographs:  Radiographs demonstrate no acute osseous abnormalities.  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  Plan: After sterile Betadine skin prep I injected 20 mg Kenalog 5 mg Marcaine point of maximal tenderness bilaterally.  Tolerated procedure well without complications.  Follow-up with him in 1 month.     Keilee Denman T. [place], [place]

## 2019-06-25 ENCOUNTER — Other Ambulatory Visit: Payer: Self-pay | Admitting: Family Medicine

## 2019-06-27 ENCOUNTER — Other Ambulatory Visit: Payer: Self-pay | Admitting: Family Medicine

## 2019-06-28 ENCOUNTER — Other Ambulatory Visit: Payer: Self-pay | Admitting: Family Medicine

## 2019-06-28 NOTE — Telephone Encounter (Signed)
Requested medication (s) are due for refill today: no  Requested medication (s) are on the active medication list: no  Last refill:  02/08/2018  Future visit scheduled: no  Notes to clinic:  this refill cannot be delegated    Requested Prescriptions  Pending Prescriptions Disp Refills   ondansetron (ZOFRAN-ODT) 8 MG disintegrating tablet [Pharmacy Med Name: ONDANSETRON  8MG  TAB  ODT] 60 tablet 0    Sig: DISSOLVE 1 TABLET ON THE  TONGUE EVERY 8 HOURS AS  NEEDED FOR NAUSEA      Not Delegated - Gastroenterology: Antiemetics Failed - 06/28/2019  9:15 AM      Failed - This refill cannot be delegated      Failed - Valid encounter within last 6 months    Recent Outpatient Visits           1 year ago Tachycardia   [facility] at [facility], Ranell Patrick, MD   1 year ago Annual physical exam   [facility] at [facility], Ranell Patrick, MD   1 year ago Essential hypertension   [facility] at [facility], Ranell Patrick, MD   1 year ago Incisional hernia, without obstruction or gangrene   Primary Care at [facility], Renette Butters, MD   1 year ago Generalized abdominal pain   Primary Care at [facility], Renette Butters, MD

## 2019-06-28 NOTE — Telephone Encounter (Signed)
Requested medication (s) are due for refill today:  no  Requested medication (s) are on the active medication list: yes  Last refill:  07/13/2018  Future visit scheduled : no  Notes to clinic:  Patient was last seen by Dr. Tamala Julian and saw Dr. Carlota Raspberry. I don't think patient is at the practice anymore. Please advise    Requested Prescriptions  Pending Prescriptions Disp Refills   lisinopril-hydrochlorothiazide (ZESTORETIC) 20-25 MG tablet [Pharmacy Med Name: LISINOPRIL/HCTZ 20-25MG TABLET] 90 tablet 3    Sig: TAKE 1 TABLET BY MOUTH  DAILY      Cardiovascular:  ACEI + Diuretic Combos Failed - 06/28/2019  9:15 AM      Failed - Na in normal range and within 180 days    Sodium  Date Value Ref Range Status  06/07/2018 138 134 - 144 mmol/L Final          Failed - K in normal range and within 180 days    Potassium  Date Value Ref Range Status  06/07/2018 4.4 3.5 - 5.2 mmol/L Final          Failed - Cr in normal range and within 180 days    Creat  Date Value Ref Range Status  02/19/2016 1.10 0.60 - 1.35 mg/dL Final   Creatinine, Ser  Date Value Ref Range Status  06/07/2018 1.22 0.76 - 1.27 mg/dL Final          Failed - Ca in normal range and within 180 days    Calcium  Date Value Ref Range Status  06/07/2018 9.2 8.7 - 10.2 mg/dL Final   Calcium, Ion  Date Value Ref Range Status  01/25/2008 1.16  Final          Failed - Valid encounter within last 6 months    Recent Outpatient Visits           1 year ago Tachycardia   [facility] at [facility], Ranell Patrick, MD   1 year ago Annual physical exam   [facility] at [facility], Ranell Patrick, MD   1 year ago Essential hypertension   [facility] at [facility], Ranell Patrick, MD   1 year ago Incisional hernia, without obstruction or gangrene   Primary Care at [facility], Renette Butters, MD   1 year ago Generalized abdominal pain   Primary Care at [facility], Renette Butters, MD              Passed - Patient is not pregnant       Passed - Last BP in normal range    BP Readings from Last 1 Encounters:  05/24/19 130/89

## 2019-07-05 ENCOUNTER — Ambulatory Visit: Payer: 59 | Admitting: Podiatry

## 2019-07-05 ENCOUNTER — Other Ambulatory Visit: Payer: Self-pay

## 2019-07-05 ENCOUNTER — Encounter: Payer: Self-pay | Admitting: Podiatry

## 2019-07-05 DIAGNOSIS — G5762 Lesion of plantar nerve, left lower limb: Secondary | ICD-10-CM | POA: Diagnosis not present

## 2019-07-05 DIAGNOSIS — G5761 Lesion of plantar nerve, right lower limb: Secondary | ICD-10-CM

## 2019-07-05 DIAGNOSIS — G5782 Other specified mononeuropathies of left lower limb: Secondary | ICD-10-CM

## 2019-07-05 DIAGNOSIS — G5781 Other specified mononeuropathies of right lower limb: Secondary | ICD-10-CM

## 2019-07-06 NOTE — Progress Notes (Signed)
He presents today for follow-up of neuroma third interdigital space of the right foot.  States that the shot really only lasted for about a week but he went in purchased a pair of power step insoles which really helped a lot he states that he really has no pain whatsoever at this point.  Objective: Vital signs are stable he is alert and oriented x3 pulses are palpable.  No pain on palpation to the second and third metatarsophalangeal joints or interdigital space.  Assessment: Well-healing capsulitis and neuroma.  Follow-up with me as needed.

## 2019-11-25 ENCOUNTER — Other Ambulatory Visit: Payer: Self-pay

## 2019-11-25 ENCOUNTER — Ambulatory Visit: Payer: 59 | Admitting: Podiatry

## 2019-11-25 DIAGNOSIS — M778 Other enthesopathies, not elsewhere classified: Secondary | ICD-10-CM

## 2019-11-25 NOTE — Progress Notes (Signed)
He presents today states that his feet have started bothering him again.  He states is right underneath here as he points to the second third metatarsophalangeal joints.  Objective: Vital signs are stable he is alert oriented x3 pulses are palpable.  He has pain on palpation second metatarsophalangeal joint.  He also has pain on palpation third interdigital space bilaterally.  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  Assessment: Capsulitis second metatarsophalangeal joints.  Plan: At this point I injected 10 mg around the joint today he tolerated procedure well without complications we did discuss shoe gear modifications for a while.  We stated that if this did not resolve her continued then we should consider custom built orthotics.

## 2020-08-09 ENCOUNTER — Ambulatory Visit: Payer: 59 | Admitting: Podiatry

## 2020-08-09 ENCOUNTER — Encounter: Payer: Self-pay | Admitting: Podiatry

## 2020-08-09 ENCOUNTER — Other Ambulatory Visit: Payer: Self-pay

## 2020-08-09 DIAGNOSIS — M778 Other enthesopathies, not elsewhere classified: Secondary | ICD-10-CM

## 2020-08-09 MED ORDER — TRIAMCINOLONE ACETONIDE 40 MG/ML IJ SUSP
40.0000 mg | Freq: Once | INTRAMUSCULAR | Status: AC
  Administered 2020-08-09: 40 mg

## 2020-08-12 NOTE — Progress Notes (Signed)
He presents today for follow-up of his capsulitis and neuroma to the third interdigital space of the right foot.  He states that he is doing much better with the capsulitis at the second metatarsophalangeal joints bilaterally he is ready to go ahead and get his orthotics.  Objective: Vital signs are stable alert oriented x3.  Pulses are palpable.  Neurologic sensorium is intact deep to reflexes are intact he has minimal pain on palpation sec metatarsophalangeal joint and midfoot bilaterally.  Some tenderness at end range of motions.  Assessment: Capsulitis forefoot bilateral.  Plan: Discussed etiology pathology conservative therapies at this point in time went ahead and injected him periarticular lead today about the second metatarsophalangeal joint with 10 mg Kenalog 5 mg Marcaine to the point of maximal tenderness.  He tolerated the procedure well without complications.  He was also seen by Liliane Channel today for casting for orthotics.  This will consist of a orthotic that is made to his subtalar joint neutral position as well as a cut out beneath the second metatarsophalangeal joint and a small metatarsal pad.  Follow-up with him in 6 weeks once the orthotics come in.

## 2020-08-29 ENCOUNTER — Ambulatory Visit: Payer: No Typology Code available for payment source | Admitting: Podiatrist

## 2021-01-29 ENCOUNTER — Other Ambulatory Visit: Payer: Self-pay | Admitting: Family Medicine

## 2021-01-29 DIAGNOSIS — E010 Iodine-deficiency related diffuse (endemic) goiter: Secondary | ICD-10-CM

## 2021-02-04 ENCOUNTER — Ambulatory Visit
Admission: RE | Admit: 2021-02-04 | Discharge: 2021-02-04 | Disposition: A | Discharge status: Home / Self Care | Payer: No Typology Code available for payment source | Source: Ambulatory Visit | Attending: Family Medicine | Admitting: Family Medicine

## 2021-02-04 ENCOUNTER — Other Ambulatory Visit: Payer: Self-pay

## 2021-02-04 DIAGNOSIS — E010 Iodine-deficiency related diffuse (endemic) goiter: Secondary | ICD-10-CM

## 2021-04-25 ENCOUNTER — Ambulatory Visit (INDEPENDENT_AMBULATORY_CARE_PROVIDER_SITE_OTHER): Payer: No Typology Code available for payment source | Admitting: Podiatry

## 2021-04-25 ENCOUNTER — Other Ambulatory Visit: Payer: Self-pay

## 2021-04-25 ENCOUNTER — Encounter: Payer: Self-pay | Admitting: Podiatry

## 2021-04-25 DIAGNOSIS — M778 Other enthesopathies, not elsewhere classified: Secondary | ICD-10-CM

## 2021-04-25 MED ORDER — TRIAMCINOLONE ACETONIDE 40 MG/ML IJ SUSP
40.0000 mg | Freq: Once | INTRAMUSCULAR | Status: AC
  Administered 2021-04-25: 40 mg

## 2021-04-25 NOTE — Progress Notes (Signed)
He presents today for follow-up of capsulitis of the second metatarsophalangeal joint states that I need to tune up he seems like about every for 5 months.  States that he really likes his orthotics and they have helped considerably.  Would like to consider another pair as long as insurance covers them.  Objective: Vital signs are stable alert and oriented x3.  Pulses are palpable.  He has mild tenderness on palpation of the second metatarsal phalangeal joint as well as end range of motion bilaterally left greater than right.  Assessment: Chronic capsulitis second metatarsophalangeal joint bilateral.  Plan: Periarticular injection with 10 mg of Kenalog 5 mg Marcaine point of maximal tenderness.  Tolerated procedure well bilaterally.  I will follow-up with him on an as-needed basis.  Instructed him to speak with his insurance company regarding his second pair of orthotics within the same year.

## 2021-07-10 ENCOUNTER — Telehealth: Payer: Self-pay | Admitting: Podiatry

## 2021-07-10 NOTE — Telephone Encounter (Signed)
Pts wife left message stating insurance stated they would pay for 1 pr orthotics per year and they would like to order another pair.   I returned call to pts wife and it went to voicemail and mailbox is full. No mychart set up.

## 2021-07-31 ENCOUNTER — Telehealth: Payer: Self-pay | Admitting: Podiatry

## 2021-07-31 DIAGNOSIS — M778 Other enthesopathies, not elsewhere classified: Secondary | ICD-10-CM | POA: Diagnosis not present

## 2021-07-31 NOTE — Telephone Encounter (Signed)
Pts wife called back to confirm that they are wanting to order another pair of orthotics just like the last ones and we are billing insurance.  I told her usually takes 2 to 3 weeks.

## 2021-07-31 NOTE — Telephone Encounter (Signed)
Pts wife left message stating she checked with the insurance and they said the orthotics are covered 1 per calendar year and she would like to order another pair.  I returned call and left message for pts wife to please call me back that I have to get confirmation and cannot go by voicemail.

## 2021-10-02 NOTE — Telephone Encounter (Signed)
LVM for pt to call back. Returning a call following up on the second pair of orthotics. I have sent a message to the vendor to follow up on this as I do not have an additional set of orthotics in office for the pt.

## 2021-10-03 ENCOUNTER — Telehealth: Payer: Self-pay

## 2021-10-03 NOTE — Telephone Encounter (Signed)
Left VM - Patient's foot orthotics are ready for pickup ?

## 2021-10-10 ENCOUNTER — Ambulatory Visit (INDEPENDENT_AMBULATORY_CARE_PROVIDER_SITE_OTHER): Payer: No Typology Code available for payment source

## 2021-10-10 DIAGNOSIS — M778 Other enthesopathies, not elsewhere classified: Secondary | ICD-10-CM | POA: Diagnosis not present

## 2021-10-10 NOTE — Progress Notes (Signed)
SITUATION: ?Reason for Visit: Fitting and Delivery of Custom Fabricated Foot Orthoses ?Patient Report: Patient reports comfort and is satisfied with device. ? ?OBJECTIVE DATA: ?Patient History / Diagnosis:   ?  ICD-10-CM   ?1. Capsulitis of foot, unspecified laterality  M77.8   ?  ? ? ?Provided Device:  Custom Functional Foot Orthotics ?    [facility]: [id number] - 2nd Pair ? ?GOAL OF ORTHOSIS ?- Improve gait ?- Decrease energy expenditure ?- Improve Balance ?- Provide Triplanar stability of foot complex ?- Facilitate motion ? ?ACTIONS PERFORMED ?Patient was fit with foot orthotics trimmed to shoe last. Patient tolerated fittign procedure.  ? ?Patient was provided with verbal and written instruction and demonstration regarding donning, doffing, wear, care, proper fit, function, purpose, cleaning, and use of the orthosis and in all related precautions and risks and benefits regarding the orthosis. ? ?Patient was also provided with verbal instruction regarding how to report any failures or malfunctions of the orthosis and necessary follow up care. Patient was also instructed to contact our office regarding any change in status that may affect the function of the orthosis. ? ?Patient demonstrated independence with proper donning, doffing, and fit and verbalized understanding of all instructions. ? ?PLAN: ?Patient is to follow up in one week or as necessary (PRN). All questions were answered and concerns addressed. Plan of care was discussed with and agreed upon by the patient. ? ?

## 2021-11-11 ENCOUNTER — Ambulatory Visit: Payer: No Typology Code available for payment source | Admitting: Podiatry

## 2021-11-12 ENCOUNTER — Ambulatory Visit (INDEPENDENT_AMBULATORY_CARE_PROVIDER_SITE_OTHER): Payer: No Typology Code available for payment source | Admitting: Podiatry

## 2021-11-12 ENCOUNTER — Encounter: Payer: Self-pay | Admitting: Podiatry

## 2021-11-12 DIAGNOSIS — M778 Other enthesopathies, not elsewhere classified: Secondary | ICD-10-CM

## 2021-11-12 DIAGNOSIS — G5790 Unspecified mononeuropathy of unspecified lower limb: Secondary | ICD-10-CM

## 2021-11-12 MED ORDER — GABAPENTIN 300 MG PO CAPS: 300.0000 mg | ORAL_CAPSULE | Freq: Every day | ORAL | 3 refills | Status: DC

## 2021-11-12 NOTE — Progress Notes (Signed)
Leroy Herrera presents today for a follow-up of his capsulitis bilateral second metatarsophalangeal joints.  He states that I got the new orthotics they seem to be doing really well I do not have any pain during the day under my forefoot anymore some very happy with that.  I am still getting numbness and tingling and burning at nighttime however states that he only notices at night while he is in bed and first thing in the morning before he gets up.  Denies any back trouble.  States that he was following up with his primary care provider for yearly physical in the next week or so.  He denies any polydipsia polyuria polyphagia.  Objective: Vital signs are stable he is alert and oriented x3.  Pulses are palpable.  Semmes Weinstein monofilament i does not demonstrate any loss of sensation.  He has good proprioceptive sensation.  No pain on palpation or range of motion.  Assessment: Very well could have some early neuropathy but at this point what is going: Neuritis could cause it could be associated with the inflammation around the second metatarsal phalangeal joint area.  Plan: I am going to wait for him to follow-up with his primary care provider to have the complete metabolic panel done check his blood sugars and his all of his other blood work to make sure that there are any problems.  However to help him rest I am going to start him on gabapentin 300 mg 1 p.o. nightly should this not alleviate his symptoms he will follow-up with me in a month or so.

## 2021-12-17 ENCOUNTER — Encounter: Payer: Self-pay | Admitting: Podiatry

## 2021-12-17 ENCOUNTER — Ambulatory Visit (INDEPENDENT_AMBULATORY_CARE_PROVIDER_SITE_OTHER): Payer: No Typology Code available for payment source | Admitting: Podiatry

## 2021-12-17 DIAGNOSIS — Z79899 Other long term (current) drug therapy: Secondary | ICD-10-CM

## 2021-12-17 DIAGNOSIS — G5793 Unspecified mononeuropathy of bilateral lower limbs: Secondary | ICD-10-CM | POA: Diagnosis not present

## 2021-12-25 ENCOUNTER — Other Ambulatory Visit: Payer: Self-pay | Admitting: *Deleted

## 2021-12-25 MED ORDER — GABAPENTIN 300 MG PO CAPS
600.0000 mg | ORAL_CAPSULE | Freq: Every day | ORAL | 3 refills | Status: DC
Start: 2021-12-25 — End: 2022-04-29

## 2022-01-16 ENCOUNTER — Ambulatory Visit: Payer: No Typology Code available for payment source | Admitting: Podiatry

## 2022-04-27 ENCOUNTER — Other Ambulatory Visit: Payer: Self-pay | Admitting: Podiatry
